# Patient Record
Sex: Female | Born: 1940 | Race: White | Hispanic: No
Health system: Southern US, Academic
[De-identification: ages and names within clinical notes are randomized; demographics above are authoritative.]

## PROBLEM LIST (undated history)

## (undated) ENCOUNTER — Encounter: Attending: Geriatric Medicine | Primary: Geriatric Medicine

## (undated) ENCOUNTER — Ambulatory Visit

## (undated) ENCOUNTER — Encounter: Attending: Pharmacist | Primary: Pharmacist

## (undated) ENCOUNTER — Ambulatory Visit: Payer: MEDICARE

## (undated) ENCOUNTER — Ambulatory Visit: Payer: MEDICARE | Attending: Adult Health | Primary: Adult Health

## (undated) ENCOUNTER — Telehealth: Attending: Radiation Oncology | Primary: Radiation Oncology

## (undated) ENCOUNTER — Ambulatory Visit: Payer: MEDICARE | Attending: Geriatric Medicine | Primary: Geriatric Medicine

## (undated) ENCOUNTER — Telehealth

## (undated) ENCOUNTER — Telehealth: Attending: Pharmacist | Primary: Pharmacist

## (undated) ENCOUNTER — Encounter

## (undated) ENCOUNTER — Encounter: Attending: Nurse Practitioner | Primary: Nurse Practitioner

## (undated) ENCOUNTER — Telehealth: Attending: Geriatric Medicine | Primary: Geriatric Medicine

## (undated) ENCOUNTER — Encounter
Attending: Student in an Organized Health Care Education/Training Program | Primary: Student in an Organized Health Care Education/Training Program

## (undated) ENCOUNTER — Encounter: Attending: Adult Health | Primary: Adult Health

## (undated) ENCOUNTER — Ambulatory Visit: Attending: Radiation Oncology | Primary: Radiation Oncology

## (undated) ENCOUNTER — Telehealth: Attending: Critical Care Medicine | Primary: Critical Care Medicine

## (undated) ENCOUNTER — Ambulatory Visit: Payer: MEDICARE | Attending: Nurse Practitioner | Primary: Nurse Practitioner

## (undated) ENCOUNTER — Encounter: Attending: Surgery | Primary: Surgery

## (undated) ENCOUNTER — Encounter: Attending: Clinical | Primary: Clinical

## (undated) ENCOUNTER — Encounter: Attending: Radiation Oncology | Primary: Radiation Oncology

## (undated) DIAGNOSIS — J309 Allergic rhinitis, unspecified: Secondary | ICD-10-CM

## (undated) DIAGNOSIS — L03211 Cellulitis of face: Secondary | ICD-10-CM

## (undated) DIAGNOSIS — E119 Type 2 diabetes mellitus without complications: Secondary | ICD-10-CM

## (undated) DIAGNOSIS — K279 Peptic ulcer, site unspecified, unspecified as acute or chronic, without hemorrhage or perforation: Secondary | ICD-10-CM

## (undated) DIAGNOSIS — R5381 Other malaise: Secondary | ICD-10-CM

## (undated) DIAGNOSIS — C50312 Malignant neoplasm of lower-inner quadrant of left female breast: Secondary | ICD-10-CM

## (undated) DIAGNOSIS — B029 Zoster without complications: Secondary | ICD-10-CM

## (undated) DIAGNOSIS — M949 Disorder of cartilage, unspecified: Secondary | ICD-10-CM

## (undated) DIAGNOSIS — M25569 Pain in unspecified knee: Secondary | ICD-10-CM

## (undated) DIAGNOSIS — D509 Iron deficiency anemia, unspecified: Secondary | ICD-10-CM

## (undated) DIAGNOSIS — M899 Disorder of bone, unspecified: Secondary | ICD-10-CM

## (undated) DIAGNOSIS — J019 Acute sinusitis, unspecified: Secondary | ICD-10-CM

## (undated) DIAGNOSIS — E785 Hyperlipidemia, unspecified: Secondary | ICD-10-CM

## (undated) DIAGNOSIS — R5383 Other fatigue: Secondary | ICD-10-CM

## (undated) DIAGNOSIS — I10 Essential (primary) hypertension: Secondary | ICD-10-CM

## (undated) DIAGNOSIS — J209 Acute bronchitis, unspecified: Secondary | ICD-10-CM

## (undated) DIAGNOSIS — M545 Low back pain: Secondary | ICD-10-CM

## (undated) HISTORY — DX: Peptic ulcer, site unspecified, unspecified as acute or chronic, without hemorrhage or perforation: K27.9

## (undated) HISTORY — PX: APPENDECTOMY: SHX54

## (undated) HISTORY — DX: Iron deficiency anemia, unspecified: D50.9

## (undated) HISTORY — DX: Disorder of bone, unspecified: M89.9

## (undated) HISTORY — DX: Malignant neoplasm of lower-inner quadrant of left female breast: C50.312

## (undated) HISTORY — DX: Disorder of cartilage, unspecified: M94.9

## (undated) HISTORY — DX: Type 2 diabetes mellitus without complications: E11.9

## (undated) HISTORY — DX: Low back pain: M54.5

## (undated) HISTORY — DX: Other malaise: R53.81

## (undated) HISTORY — DX: Pain in unspecified knee: M25.569

## (undated) HISTORY — DX: Acute bronchitis, unspecified: J20.9

## (undated) HISTORY — PX: ABDOMINAL HYSTERECTOMY: SHX81

## (undated) HISTORY — DX: Allergic rhinitis, unspecified: J30.9

## (undated) HISTORY — DX: Other fatigue: R53.83

## (undated) HISTORY — DX: Essential (primary) hypertension: I10

## (undated) HISTORY — DX: Hyperlipidemia, unspecified: E78.5

## (undated) HISTORY — DX: Acute sinusitis, unspecified: J01.90

---
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
## 1968-03-29 HISTORY — PX: ESOPHAGOGASTRODUODENOSCOPY: SHX1529

## 2005-03-23 ENCOUNTER — Emergency Department (HOSPITAL_COMMUNITY): Admission: EM | Admit: 2005-03-23 | Discharge: 2005-03-23 | Payer: Self-pay | Admitting: Family Medicine

## 2005-03-29 LAB — CONVERTED CEMR LAB: Pap Smear: NORMAL

## 2005-05-06 ENCOUNTER — Ambulatory Visit: Payer: Self-pay | Admitting: Internal Medicine

## 2005-05-10 ENCOUNTER — Ambulatory Visit: Payer: Self-pay | Admitting: Internal Medicine

## 2005-05-11 ENCOUNTER — Ambulatory Visit: Payer: Self-pay | Admitting: Internal Medicine

## 2005-06-07 ENCOUNTER — Ambulatory Visit: Payer: Self-pay | Admitting: Internal Medicine

## 2006-01-25 ENCOUNTER — Ambulatory Visit: Payer: Self-pay | Admitting: Internal Medicine

## 2006-02-28 ENCOUNTER — Ambulatory Visit: Payer: Self-pay | Admitting: Internal Medicine

## 2006-04-27 ENCOUNTER — Ambulatory Visit: Payer: Self-pay | Admitting: Internal Medicine

## 2006-05-27 ENCOUNTER — Ambulatory Visit: Payer: Self-pay | Admitting: Internal Medicine

## 2006-11-05 ENCOUNTER — Encounter: Payer: Self-pay | Admitting: Internal Medicine

## 2006-11-05 DIAGNOSIS — I10 Essential (primary) hypertension: Secondary | ICD-10-CM

## 2006-11-05 HISTORY — DX: Essential (primary) hypertension: I10

## 2006-11-30 ENCOUNTER — Ambulatory Visit: Payer: Self-pay | Admitting: Internal Medicine

## 2006-11-30 LAB — CONVERTED CEMR LAB
ALT: 17 units/L (ref 0–35)
BUN: 21 mg/dL (ref 6–23)
Basophils Absolute: 0 10*3/uL (ref 0.0–0.1)
Bilirubin, Direct: 0.1 mg/dL (ref 0.0–0.3)
Chloride: 105 meq/L (ref 96–112)
Cholesterol: 209 mg/dL (ref 0–200)
Creatinine, Ser: 0.9 mg/dL (ref 0.4–1.2)
GFR calc Af Amer: 81 mL/min
GFR calc non Af Amer: 67 mL/min
Glucose, Bld: 117 mg/dL — ABNORMAL HIGH (ref 70–99)
Hgb A1c MFr Bld: 6.6 % — ABNORMAL HIGH (ref 4.6–6.0)
MCHC: 34.1 g/dL (ref 30.0–36.0)
Monocytes Relative: 10.5 % (ref 3.0–11.0)
RBC: 4.07 M/uL (ref 3.87–5.11)
RDW: 15.8 % — ABNORMAL HIGH (ref 11.5–14.6)
Sodium: 141 meq/L (ref 135–145)
TSH: 1.13 microintl units/mL (ref 0.35–5.50)
Total CHOL/HDL Ratio: 6.9
Total Protein: 7.8 g/dL (ref 6.0–8.3)
Triglycerides: 117 mg/dL (ref 0–149)

## 2006-12-08 ENCOUNTER — Ambulatory Visit (HOSPITAL_COMMUNITY): Admission: RE | Admit: 2006-12-08 | Discharge: 2006-12-08 | Payer: Self-pay | Admitting: Internal Medicine

## 2007-08-16 ENCOUNTER — Telehealth: Payer: Self-pay | Admitting: Internal Medicine

## 2008-02-12 ENCOUNTER — Ambulatory Visit: Payer: Self-pay | Admitting: Internal Medicine

## 2008-02-12 DIAGNOSIS — E785 Hyperlipidemia, unspecified: Secondary | ICD-10-CM | POA: Insufficient documentation

## 2008-02-12 DIAGNOSIS — M545 Low back pain, unspecified: Secondary | ICD-10-CM

## 2008-02-12 DIAGNOSIS — K279 Peptic ulcer, site unspecified, unspecified as acute or chronic, without hemorrhage or perforation: Secondary | ICD-10-CM | POA: Insufficient documentation

## 2008-02-12 DIAGNOSIS — M949 Disorder of cartilage, unspecified: Secondary | ICD-10-CM

## 2008-02-12 DIAGNOSIS — M899 Disorder of bone, unspecified: Secondary | ICD-10-CM

## 2008-02-12 DIAGNOSIS — R5381 Other malaise: Secondary | ICD-10-CM

## 2008-02-12 DIAGNOSIS — R5383 Other fatigue: Secondary | ICD-10-CM

## 2008-02-12 DIAGNOSIS — D509 Iron deficiency anemia, unspecified: Secondary | ICD-10-CM

## 2008-02-12 DIAGNOSIS — J309 Allergic rhinitis, unspecified: Secondary | ICD-10-CM | POA: Insufficient documentation

## 2008-02-12 DIAGNOSIS — E119 Type 2 diabetes mellitus without complications: Secondary | ICD-10-CM

## 2008-02-12 HISTORY — DX: Allergic rhinitis, unspecified: J30.9

## 2008-02-12 HISTORY — DX: Iron deficiency anemia, unspecified: D50.9

## 2008-02-12 HISTORY — DX: Disorder of bone, unspecified: M89.9

## 2008-02-12 HISTORY — DX: Hyperlipidemia, unspecified: E78.5

## 2008-02-12 HISTORY — DX: Peptic ulcer, site unspecified, unspecified as acute or chronic, without hemorrhage or perforation: K27.9

## 2008-02-12 HISTORY — DX: Other malaise: R53.81

## 2008-02-12 HISTORY — DX: Type 2 diabetes mellitus without complications: E11.9

## 2008-02-12 HISTORY — DX: Low back pain, unspecified: M54.50

## 2008-02-13 LAB — CONVERTED CEMR LAB
ALT: 16 units/L (ref 0–35)
AST: 16 units/L (ref 0–37)
Alkaline Phosphatase: 86 units/L (ref 39–117)
Basophils Relative: 0.5 % (ref 0.0–3.0)
Bilirubin, Direct: 0.1 mg/dL (ref 0.0–0.3)
CO2: 29 meq/L (ref 19–32)
Calcium: 9.3 mg/dL (ref 8.4–10.5)
Chloride: 105 meq/L (ref 96–112)
Direct LDL: 145.9 mg/dL
Eosinophils Relative: 4.3 % (ref 0.0–5.0)
Folate: 18.7 ng/mL
HDL: 25.6 mg/dL — ABNORMAL LOW (ref >39.0)
Hemoglobin, Urine: NEGATIVE
Microalb, Ur: 0.7 mg/dL (ref 0.0–1.9)
Monocytes Relative: 10.5 % (ref 3.0–12.0)
Neutrophils Relative %: 60.1 % (ref 43.0–77.0)
Nitrite: NEGATIVE
Platelets: 343 10*3/uL (ref 150–400)
Potassium: 4.3 meq/L (ref 3.5–5.1)
RBC: 4.15 M/uL (ref 3.87–5.11)
Sed Rate: 53 mm/hr — ABNORMAL HIGH (ref 0–22)
Sodium: 141 meq/L (ref 135–145)
Specific Gravity, Urine: >=1.03 (ref 1.000–1.03)
TSH: 1.35 microintl units/mL (ref 0.35–5.50)
Total Bilirubin: 0.6 mg/dL (ref 0.3–1.2)
Total Protein, Urine: NEGATIVE mg/dL
Urobilinogen, UA: 0.2 (ref 0.0–1.0)
VLDL: 24 mg/dL (ref 0–40)
WBC: 9.8 10*3/uL (ref 4.5–10.5)

## 2008-02-21 ENCOUNTER — Telehealth: Payer: Self-pay | Admitting: Internal Medicine

## 2008-03-12 ENCOUNTER — Ambulatory Visit: Payer: Self-pay | Admitting: Internal Medicine

## 2008-03-12 LAB — CONVERTED CEMR LAB
Alkaline Phosphatase: 91 units/L (ref 39–117)
Bilirubin, Direct: 0.1 mg/dL (ref 0.0–0.3)
Cholesterol: 148 mg/dL (ref 0–200)
LDL Cholesterol: 95 mg/dL (ref 0–99)
Total Protein: 7.3 g/dL (ref 6.0–8.3)

## 2008-04-02 ENCOUNTER — Ambulatory Visit: Payer: Self-pay | Admitting: Internal Medicine

## 2008-04-16 ENCOUNTER — Ambulatory Visit: Payer: Self-pay | Admitting: Internal Medicine

## 2008-04-16 LAB — HM COLONOSCOPY

## 2008-07-06 ENCOUNTER — Ambulatory Visit: Payer: Self-pay | Admitting: Family Medicine

## 2008-07-06 DIAGNOSIS — J209 Acute bronchitis, unspecified: Secondary | ICD-10-CM

## 2008-07-06 HISTORY — DX: Acute bronchitis, unspecified: J20.9

## 2008-10-08 ENCOUNTER — Ambulatory Visit: Payer: Self-pay | Admitting: Internal Medicine

## 2008-10-08 DIAGNOSIS — J019 Acute sinusitis, unspecified: Secondary | ICD-10-CM

## 2008-10-08 HISTORY — DX: Acute sinusitis, unspecified: J01.90

## 2008-11-14 ENCOUNTER — Ambulatory Visit: Payer: Self-pay | Admitting: Internal Medicine

## 2009-04-30 ENCOUNTER — Telehealth: Payer: Self-pay | Admitting: Internal Medicine

## 2009-05-14 ENCOUNTER — Ambulatory Visit: Payer: Self-pay | Admitting: Internal Medicine

## 2009-05-14 ENCOUNTER — Telehealth (INDEPENDENT_AMBULATORY_CARE_PROVIDER_SITE_OTHER): Payer: Self-pay | Admitting: *Deleted

## 2009-05-14 LAB — CONVERTED CEMR LAB
BUN: 15 mg/dL (ref 6–23)
Basophils Absolute: 0 10*3/uL (ref 0.0–0.1)
Bilirubin, Direct: 0.1 mg/dL (ref 0.0–0.3)
CO2: 30 meq/L (ref 19–32)
Chloride: 100 meq/L (ref 96–112)
Creatinine, Ser: 0.8 mg/dL (ref 0.4–1.2)
Eosinophils Absolute: 0.2 10*3/uL (ref 0.0–0.7)
Hemoglobin, Urine: NEGATIVE
Hgb A1c MFr Bld: 6.5 % (ref 4.6–6.5)
Iron: 24 ug/dL — ABNORMAL LOW (ref 42–145)
Ketones, ur: NEGATIVE mg/dL
MCHC: 32.8 g/dL (ref 30.0–36.0)
MCV: 82.6 fL (ref 78.0–100.0)
Microalb, Ur: 1.1 mg/dL (ref 0.0–1.9)
Monocytes Absolute: 0.8 10*3/uL (ref 0.1–1.0)
Neutrophils Relative %: 63.2 % (ref 43.0–77.0)
Platelets: 349 10*3/uL (ref 150.0–400.0)
Saturation Ratios: 7.3 % — ABNORMAL LOW (ref 20.0–50.0)
Sed Rate: 40 mm/hr — ABNORMAL HIGH (ref 0–22)
Total Bilirubin: 0.5 mg/dL (ref 0.3–1.2)
Total CHOL/HDL Ratio: 6
Triglycerides: 138 mg/dL (ref 0.0–149.0)
Urine Glucose: NEGATIVE mg/dL
Urobilinogen, UA: 0.2 (ref 0.0–1.0)
VLDL: 27.6 mg/dL (ref 0.0–40.0)
Vitamin B-12: 773 pg/mL (ref 211–911)
WBC: 8.3 10*3/uL (ref 4.5–10.5)

## 2009-05-15 LAB — CONVERTED CEMR LAB: Vit D, 25-Hydroxy: 28 ng/mL — ABNORMAL LOW (ref 30–89)
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
## 2010-04-30 NOTE — Progress Notes (Signed)
Summary: Rx change  Phone Note Call from Patient Call back at Home Phone [phone]   Caller: Patient Call For: Corwin Levins MD Summary of Call: Patient came into the office with a letter from her insurance company. Her coverage on Benicar has changed and her copay has went up dramatically. She was wondering if she could get put on a more affordable medication.  Initial call taken by: Irma Newness,  April 30, 2009 3:16 PM  Follow-up for Phone Call        ok to try change to diovan hct  - done escript Follow-up by: Corwin Levins MD,  April 30, 2009 4:11 PM  Additional Follow-up for Phone Call Additional follow up Details #1::        pt informed via VM Additional Follow-up by: Margaret Pyle, CMA,  April 30, 2009 4:15 PM    New/Updated Medications: DIOVAN HCT 320-25 MG TABS (VALSARTAN-HYDROCHLOROTHIAZIDE) 1 by mouth once daily Prescriptions: DIOVAN HCT 320-25 MG TABS (VALSARTAN-HYDROCHLOROTHIAZIDE) 1 by mouth once daily  #90 x 3   Entered and Authorized by:   Corwin Levins MD   Signed by:   Corwin Levins MD on 04/30/2009   Method used:   Electronically to        [facility] [phone]* (retail)       [address]       [place], [place]  [id number]       Ph: [phone] or [phone]       Fax: [phone]   RxID:   [id number]

## 2010-04-30 NOTE — Progress Notes (Signed)
----   Converted from flag ---- ---- 05/14/2009 4:19 PM, Corwin Levins MD wrote: to send addon slip to lab for this pt -   hgba1c - 790.2 ------------------------------  sent addon to lab, hgba1c 790.2

## 2010-05-11 ENCOUNTER — Ambulatory Visit (INDEPENDENT_AMBULATORY_CARE_PROVIDER_SITE_OTHER)
Admission: RE | Admit: 2010-05-11 | Discharge: 2010-05-11 | Disposition: A | Discharge status: Home / Self Care | Payer: Medicare Other | Source: Ambulatory Visit | Attending: Internal Medicine | Admitting: Internal Medicine

## 2010-05-11 ENCOUNTER — Ambulatory Visit (INDEPENDENT_AMBULATORY_CARE_PROVIDER_SITE_OTHER): Payer: Medicare Other | Admitting: Internal Medicine

## 2010-05-11 ENCOUNTER — Other Ambulatory Visit: Payer: Self-pay | Admitting: Internal Medicine

## 2010-05-11 ENCOUNTER — Other Ambulatory Visit: Payer: Medicare Other

## 2010-05-11 ENCOUNTER — Encounter: Payer: Self-pay | Admitting: Internal Medicine

## 2010-05-11 ENCOUNTER — Encounter (INDEPENDENT_AMBULATORY_CARE_PROVIDER_SITE_OTHER): Payer: Self-pay | Admitting: *Deleted

## 2010-05-11 DIAGNOSIS — E785 Hyperlipidemia, unspecified: Secondary | ICD-10-CM

## 2010-05-11 DIAGNOSIS — M25569 Pain in unspecified knee: Secondary | ICD-10-CM

## 2010-05-11 DIAGNOSIS — R5383 Other fatigue: Secondary | ICD-10-CM

## 2010-05-11 DIAGNOSIS — Z23 Encounter for immunization: Secondary | ICD-10-CM

## 2010-05-11 DIAGNOSIS — E119 Type 2 diabetes mellitus without complications: Secondary | ICD-10-CM

## 2010-05-11 DIAGNOSIS — R5381 Other malaise: Secondary | ICD-10-CM

## 2010-05-11 DIAGNOSIS — M949 Disorder of cartilage, unspecified: Secondary | ICD-10-CM

## 2010-05-11 HISTORY — DX: Pain in unspecified knee: M25.569

## 2010-05-11 LAB — CBC WITH DIFFERENTIAL/PLATELET
Basophils Relative: 0.3 % (ref 0.0–3.0)
Eosinophils Absolute: 0.3 10*3/uL (ref 0.0–0.7)
Eosinophils Relative: 3.6 % (ref 0.0–5.0)
HCT: 35.8 % — ABNORMAL LOW (ref 36.0–46.0)
Hemoglobin: 11.9 g/dL — ABNORMAL LOW (ref 12.0–15.0)
Lymphs Abs: 2.3 10*3/uL (ref 0.7–4.0)
MCHC: 33.3 g/dL (ref 30.0–36.0)
MCV: 80.4 fl (ref 78.0–100.0)
Monocytes Absolute: 0.8 10*3/uL (ref 0.1–1.0)
Neutro Abs: 6 10*3/uL (ref 1.4–7.7)
Neutrophils Relative %: 63.5 % (ref 43.0–77.0)
RBC: 4.45 Mil/uL (ref 3.87–5.11)

## 2010-05-11 LAB — HEPATIC FUNCTION PANEL
ALT: 16 U/L (ref 0–35)
Albumin: 3.6 g/dL (ref 3.5–5.2)
Total Protein: 7.6 g/dL (ref 6.0–8.3)

## 2010-05-11 LAB — BASIC METABOLIC PANEL
CO2: 27 mEq/L (ref 19–32)
Chloride: 96 mEq/L (ref 96–112)
Creatinine, Ser: 1 mg/dL (ref 0.4–1.2)
Potassium: 4.2 mEq/L (ref 3.5–5.1)

## 2010-05-11 LAB — HEMOGLOBIN A1C: Hgb A1c MFr Bld: 6.6 % — ABNORMAL HIGH (ref 4.6–6.5)

## 2010-05-11 LAB — LIPID PANEL
Cholesterol: 207 mg/dL — ABNORMAL HIGH (ref 0–200)
Triglycerides: 114 mg/dL (ref 0.0–149.0)

## 2010-05-11 LAB — LDL CHOLESTEROL, DIRECT: Direct LDL: 159.8 mg/dL

## 2010-05-11 LAB — TSH: TSH: 0.98 u[IU]/mL (ref 0.35–5.50)

## 2010-05-11 LAB — MICROALBUMIN / CREATININE URINE RATIO: Microalb Creat Ratio: 2.1 mg/g (ref 0.0–30.0)

## 2010-05-14 ENCOUNTER — Ambulatory Visit (INDEPENDENT_AMBULATORY_CARE_PROVIDER_SITE_OTHER)
Admission: RE | Admit: 2010-05-14 | Discharge: 2010-05-14 | Disposition: A | Discharge status: Home / Self Care | Payer: Medicare Other | Source: Ambulatory Visit | Attending: Internal Medicine | Admitting: Internal Medicine

## 2010-05-14 ENCOUNTER — Other Ambulatory Visit: Payer: Self-pay | Admitting: Internal Medicine

## 2010-05-14 ENCOUNTER — Encounter: Payer: Self-pay | Admitting: Internal Medicine

## 2010-05-14 ENCOUNTER — Other Ambulatory Visit: Payer: Medicare Other

## 2010-05-14 DIAGNOSIS — M899 Disorder of bone, unspecified: Secondary | ICD-10-CM
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
## 2010-06-08 ENCOUNTER — Telehealth: Payer: Self-pay | Admitting: Internal Medicine

## 2010-06-11 ENCOUNTER — Other Ambulatory Visit: Payer: Self-pay | Admitting: Internal Medicine

## 2010-06-11 DIAGNOSIS — M25562 Pain in left knee: Secondary | ICD-10-CM

## 2010-06-16 NOTE — Progress Notes (Signed)
Summary: MRI req  Phone Note Call from Patient Call back at Home Phone [phone]   Caller: Patient Summary of Call: Pt came into clinic and filled out a walk in form stating she is having persistant knee pain. Pt is requesting MRI to help eval knee pain. Initial call taken by: Margaret Pyle, CMA,  June 08, 2010 3:01 PM  Follow-up for Phone Call        ok for MRI  - will order Follow-up by: Corwin Levins MD,  June 08, 2010 4:59 PM  Additional Follow-up for Phone Call Additional follow up Details #1::        Pt informed, will expect a call from [facility] with appt info Additional Follow-up by: Margaret Pyle, CMA,  June 09, 2010 8:41 AM

## 2010-06-17 ENCOUNTER — Ambulatory Visit (HOSPITAL_COMMUNITY)
Admission: RE | Admit: 2010-06-17 | Discharge: 2010-06-17 | Disposition: A | Discharge status: Home / Self Care | Payer: Medicare Other | Source: Ambulatory Visit | Attending: Internal Medicine | Admitting: Internal Medicine

## 2010-06-17 DIAGNOSIS — M25469 Effusion, unspecified knee: Secondary | ICD-10-CM | POA: Insufficient documentation

## 2010-06-17 DIAGNOSIS — M2240 Chondromalacia patellae, unspecified knee: Secondary | ICD-10-CM | POA: Insufficient documentation

## 2010-06-17 DIAGNOSIS — M25562 Pain in left knee: Secondary | ICD-10-CM

## 2010-09-11 ENCOUNTER — Emergency Department (HOSPITAL_COMMUNITY): Payer: Medicare Other

## 2010-09-11 ENCOUNTER — Emergency Department (HOSPITAL_COMMUNITY)
Admission: EM | Admit: 2010-09-11 | Discharge: 2010-09-12 | Disposition: A | Discharge status: Home / Self Care | Payer: Medicare Other | Attending: Emergency Medicine | Admitting: Emergency Medicine

## 2010-09-11 DIAGNOSIS — W108XXA Fall (on) (from) other stairs and steps, initial encounter: Secondary | ICD-10-CM | POA: Insufficient documentation

## 2010-09-11 DIAGNOSIS — I10 Essential (primary) hypertension: Secondary | ICD-10-CM | POA: Insufficient documentation

## 2010-09-11 DIAGNOSIS — S52609A Unspecified fracture of lower end of unspecified ulna, initial encounter for closed fracture: Secondary | ICD-10-CM | POA: Insufficient documentation

## 2010-09-11 DIAGNOSIS — Y92009 Unspecified place in unspecified non-institutional (private) residence as the place of occurrence of the external cause: Secondary | ICD-10-CM | POA: Insufficient documentation

## 2010-09-11 DIAGNOSIS — S52509A Unspecified fracture of the lower end of unspecified radius, initial encounter for closed fracture: Secondary | ICD-10-CM | POA: Insufficient documentation

## 2010-09-11 DIAGNOSIS — M25539 Pain in unspecified wrist: Secondary | ICD-10-CM | POA: Insufficient documentation

## 2010-09-11 DIAGNOSIS — M25529 Pain in unspecified elbow: Secondary | ICD-10-CM | POA: Insufficient documentation

## 2010-09-12 ENCOUNTER — Emergency Department (HOSPITAL_COMMUNITY): Payer: Medicare Other

## 2010-09-14 NOTE — Op Note (Signed)
  Misty [name], Misty [name] NO.:  [id number]  MEDICAL RECORD NO.:  [id number]  LOCATION:  [facility]                         FACILITY:  [facility]  PHYSICIAN:  Johnette Abraham, MD    DATE OF BIRTH:  1940-07-26  DATE OF PROCEDURE:  09/12/2010 DATE OF DISCHARGE:  09/12/2010                              OPERATIVE REPORT   PREOPERATIVE DIAGNOSIS:  Closed fracture of the right distal radius and ulnar styloid.  POSTOPERATIVE DIAGNOSIS:  Closed fracture of the right distal radius and ulnar styloid.  PROCEDURES:  Closed reduction with IV sedation and manipulation of the right distal radius and ulnar styloid fracture.  INDICATIONS:  Misty [name] is a 70 year old white female who comes to the emergency department after a fall.  She has an obvious deformity to her wrist.  X-ray examination reveals comminuted fracture of distal radius and ulnar styloid.  Risks, benefits and alternatives of closed reduction and possible open reduction were discussed with her and her sister. They agreed to proceed with sedation and closed reduction.  Consent was obtained.  PROCEDURE:  A Time-out was performed, IV sedation with Versed and Dilaudid were given.  Furthermore, a hematoma block was performed with instilling approximately 8 cc of 1% plain lidocaine into the fracture site.  In- line traction and dorsal pressure were placed on the wrist and a closed reduction was performed.  All fingers remained pink at the conclusion of the reduction.  The patient tolerated the procedure well.  A long-arm sugar-tong splint was placed and postoperative x-rays are pending.  The patient tolerated the procedure well.     Johnette Abraham, MD     HCC/MEDQ  D:  09/12/2010  T:  09/12/2010  Job:  [id number]  Electronically Signed by Knute Neu MD on 09/14/2010 09:38:55 AM

## 2010-11-03 LAB — HM MAMMOGRAPHY

## 2010-11-07 ENCOUNTER — Encounter: Payer: Self-pay | Admitting: Internal Medicine

## 2010-11-07 DIAGNOSIS — Z0000 Encounter for general adult medical examination without abnormal findings: Secondary | ICD-10-CM | POA: Insufficient documentation

## 2010-11-09 ENCOUNTER — Encounter: Payer: Self-pay | Admitting: Internal Medicine

## 2010-11-09 ENCOUNTER — Other Ambulatory Visit: Payer: Self-pay | Admitting: Internal Medicine

## 2010-11-09 ENCOUNTER — Ambulatory Visit (INDEPENDENT_AMBULATORY_CARE_PROVIDER_SITE_OTHER): Payer: Medicare Other | Admitting: Internal Medicine

## 2010-11-09 ENCOUNTER — Other Ambulatory Visit (INDEPENDENT_AMBULATORY_CARE_PROVIDER_SITE_OTHER): Payer: Medicare Other

## 2010-11-09 VITALS — BP 110/68 | HR 80 | Temp 98.2°F | Ht 63.0 in | Wt 163.4 lb

## 2010-11-09 DIAGNOSIS — E119 Type 2 diabetes mellitus without complications: Secondary | ICD-10-CM

## 2010-11-09 DIAGNOSIS — M949 Disorder of cartilage, unspecified: Secondary | ICD-10-CM

## 2010-11-09 DIAGNOSIS — I10 Essential (primary) hypertension: Secondary | ICD-10-CM

## 2010-11-09 DIAGNOSIS — E785 Hyperlipidemia, unspecified: Secondary | ICD-10-CM

## 2010-11-09 LAB — BASIC METABOLIC PANEL
BUN: 23 mg/dL (ref 6–23)
Chloride: 100 mEq/L (ref 96–112)
GFR: 56.86 mL/min — ABNORMAL LOW (ref >60.00)
Glucose, Bld: 129 mg/dL — ABNORMAL HIGH (ref 70–99)
Potassium: 3.8 mEq/L (ref 3.5–5.1)
Sodium: 136 mEq/L (ref 135–145)

## 2010-11-09 LAB — LDL CHOLESTEROL, DIRECT: Direct LDL: 154.4 mg/dL

## 2010-11-09 LAB — HEMOGLOBIN A1C: Hgb A1c MFr Bld: 6.3 % (ref 4.6–6.5)

## 2010-11-09 NOTE — Assessment & Plan Note (Signed)
Has been unable to tolerate the zocor and lipitor;  To cont diet, check lipids, goal ldl < 70  Lab Results  Component Value Date   LDLCALC 95 03/12/2008

## 2010-11-09 NOTE — Patient Instructions (Signed)
Continue all other medications as before Please go to LAB in the Basement for the blood and/or urine tests to be done today Please call the phone number [phone] (the [employer]Tree System) for results of testing in 2-3 days;  When calling, simply dial the number, and when prompted enter the MRN number above (the Medical Record Number) and the # key, then the message should start. Please also consider taking the [place] plus D twice per day (for calcium and Vit D) Please return in 6 months, or sooner if needed

## 2010-11-09 NOTE — Progress Notes (Signed)
  Subjective:    Patient ID: Misty [name], female    DOB: 08/09/1940, 70 y.o.   MRN: [id number]  HPI  Here to f/u; overall doing ok,  Pt denies chest pain, increased sob or doe, wheezing, orthopnea, PND, increased LE swelling, palpitations, dizziness or syncope.  Pt denies new neurological symptoms such as new headache, or facial or extremity weakness or numbness   Pt denies polydipsia, polyuria, or low sugar symptoms such as weakness or confusion improved with po intake.  Pt states overall good compliance with meds, trying to follow lower cholesterol, diabetic diet, wt overall stable but little exercise however. Not taking the lipitor due to nausea and feeling sick.  Daughter currently with malignant cancer and recent sepsis now improved, so much stress lately.  Also with a right wrist fx from a fall down steps at home, Dr Louanna Raw surgury.  Denies worsening depressive symptoms, suicidal ideation, or panic.  Past Medical History  Diagnosis Date  . Acute bronchitis 07/06/2008  . ALLERGIC RHINITIS 02/12/2008  . ANEMIA-IRON DEFICIENCY 02/12/2008  . DIABETES MELLITUS, TYPE II 02/12/2008  . FATIGUE 02/12/2008  . HYPERLIPIDEMIA 02/12/2008  . HYPERTENSION 11/05/2006  . KNEE PAIN, LEFT, ACUTE 05/11/2010  . LOW BACK PAIN 02/12/2008  . OSTEOPENIA 02/12/2008  . PEPTIC ULCER DISEASE 02/12/2008  . SINUSITIS- ACUTE-NOS 10/08/2008   Past Surgical History  Procedure Date  . Esophagogastroduodenoscopy 1970  . Appendectomy   . Abdominal hysterectomy     reports that she has quit smoking. She does not have any smokeless tobacco history on file. She reports that she drinks alcohol. She reports that she does not use illicit drugs. family history includes Cancer in her brother and other; Colon polyps (age of onset:62) in her brother; Coronary artery disease in her brother; Depression in her child; Hypertension in her brother; and Mental illness in her other. Allergies  Allergen Reactions  . Lipitor  (Atorvastatin Calcium) Nausea Only  . Simvastatin     REACTION: gi upset   Current Outpatient Prescriptions on File Prior to Visit  Medication Sig Dispense Refill  . aspirin 81 MG tablet Take 81 mg by mouth daily.        . valsartan-hydrochlorothiazide (DIOVAN-HCT) 320-25 MG per tablet Take 1 tablet by mouth daily.         Review of Systems Review of Systems  Constitutional: Negative for diaphoresis and unexpected weight change.  HENT: Negative for drooling and tinnitus.   Eyes: Negative for photophobia and visual disturbance.  Respiratory: Negative for choking and stridor.   Gastrointestinal: Negative for vomiting and blood in stool.     Objective:   Physical Exam BP 110/68  Pulse 80  Temp(Src) 98.2 F (36.8 C) (Oral)  Ht 5\' 3"  (1.6 m)  Wt 163 lb 6 oz (74.106 kg)  BMI 28.94 kg/m2  SpO2 97% Physical Exam  VS noted Constitutional: Pt appears well-developed and well-nourished.  HENT: Head: Normocephalic.  Right Ear: External ear normal.  Left Ear: External ear normal.  Eyes: Conjunctivae and EOM are normal. Pupils are equal, round, and reactive to light.  Neck: Normal range of motion. Neck supple.  Cardiovascular: Normal rate and regular rhythm.   Pulmonary/Chest: Effort normal and breath sounds normal.  Neurological: Pt is alert. No cranial nerve deficit.  Skin: Skin is warm. No erythema.  Psychiatric: Pt behavior is normal. Thought content normal. 1+ nervous       Assessment & Plan:

## 2010-11-09 NOTE — Assessment & Plan Note (Signed)
stable overall by hx and exam, most recent data reviewed with pt, and pt to continue medical treatment as before  BP Readings from Last 3 Encounters:  11/09/10 110/68  05/11/10 126/78  05/14/09 122/72

## 2010-11-09 NOTE — Assessment & Plan Note (Signed)
D/w pt last dxa from feb 2012 - mild osteopenia only, for oscal plus D bid, exercise, f/u dxa at 2 yrs

## 2010-11-09 NOTE — Assessment & Plan Note (Signed)
stable overall by hx and exam, most recent data reviewed with pt, and pt to continue medical treatment as before ble  Lab Results  Component Value Date   HGBA1C 6.6* 05/11/2010

## 2010-11-10 ENCOUNTER — Encounter: Payer: Self-pay | Admitting: Internal Medicine

## 2010-11-10 ENCOUNTER — Other Ambulatory Visit: Payer: Self-pay | Admitting: Internal Medicine

## 2010-11-10 MED ORDER — SIMVASTATIN 40 MG PO TABS: 40.0000 mg | ORAL_TABLET | Freq: Every evening | ORAL | Status: DC

## 2011-05-10 ENCOUNTER — Encounter: Payer: Self-pay | Admitting: Internal Medicine

## 2011-05-10 ENCOUNTER — Ambulatory Visit (INDEPENDENT_AMBULATORY_CARE_PROVIDER_SITE_OTHER): Payer: Medicare Other | Admitting: Internal Medicine

## 2011-05-10 ENCOUNTER — Other Ambulatory Visit: Payer: Self-pay | Admitting: Internal Medicine

## 2011-05-10 ENCOUNTER — Other Ambulatory Visit (INDEPENDENT_AMBULATORY_CARE_PROVIDER_SITE_OTHER): Payer: Medicare Other

## 2011-05-10 VITALS — BP 102/62 | HR 89 | Temp 97.3°F | Ht 64.0 in | Wt 166.2 lb

## 2011-05-10 DIAGNOSIS — R5381 Other malaise: Secondary | ICD-10-CM

## 2011-05-10 DIAGNOSIS — R5383 Other fatigue: Secondary | ICD-10-CM

## 2011-05-10 DIAGNOSIS — I10 Essential (primary) hypertension: Secondary | ICD-10-CM

## 2011-05-10 DIAGNOSIS — E785 Hyperlipidemia, unspecified: Secondary | ICD-10-CM | POA: Diagnosis not present

## 2011-05-10 DIAGNOSIS — E119 Type 2 diabetes mellitus without complications: Secondary | ICD-10-CM

## 2011-05-10 LAB — HEPATIC FUNCTION PANEL
AST: 15 U/L (ref 0–37)
Total Bilirubin: 0.4 mg/dL (ref 0.3–1.2)

## 2011-05-10 LAB — MICROALBUMIN / CREATININE URINE RATIO
Creatinine,U: 151.7 mg/dL
Microalb Creat Ratio: 0.9 mg/g (ref 0.0–30.0)

## 2011-05-10 LAB — CBC WITH DIFFERENTIAL/PLATELET
Basophils Relative: 0.2 % (ref 0.0–3.0)
Eosinophils Relative: 2.8 % (ref 0.0–5.0)
HCT: 35 % — ABNORMAL LOW (ref 36.0–46.0)
Hemoglobin: 11.7 g/dL — ABNORMAL LOW (ref 12.0–15.0)
Lymphs Abs: 2.5 10*3/uL (ref 0.7–4.0)
MCV: 81.7 fl (ref 78.0–100.0)
Monocytes Absolute: 1.3 10*3/uL — ABNORMAL HIGH (ref 0.1–1.0)
Monocytes Relative: 12.5 % — ABNORMAL HIGH (ref 3.0–12.0)
Neutro Abs: 6.1 10*3/uL (ref 1.4–7.7)
Platelets: 355 10*3/uL (ref 150.0–400.0)
WBC: 10.2 10*3/uL (ref 4.5–10.5)

## 2011-05-10 LAB — LIPID PANEL
Cholesterol: 203 mg/dL — ABNORMAL HIGH (ref 0–200)
Total CHOL/HDL Ratio: 6

## 2011-05-10 LAB — BASIC METABOLIC PANEL
BUN: 21 mg/dL (ref 6–23)
Chloride: 103 mEq/L (ref 96–112)
Creatinine, Ser: 0.9 mg/dL (ref 0.4–1.2)
GFR: 66.45 mL/min (ref >60.00)
Glucose, Bld: 102 mg/dL — ABNORMAL HIGH (ref 70–99)

## 2011-05-10 LAB — URINALYSIS, ROUTINE W REFLEX MICROSCOPIC
Bilirubin Urine: NEGATIVE
Ketones, ur: NEGATIVE
Specific Gravity, Urine: >=1.03 (ref 1.000–1.030)
Total Protein, Urine: NEGATIVE
Urine Glucose: NEGATIVE
pH: 5.5 (ref 5.0–8.0)

## 2011-05-10 LAB — HEMOGLOBIN A1C: Hgb A1c MFr Bld: 6.5 % (ref 4.6–6.5)

## 2011-05-10 MED ORDER — ROSUVASTATIN CALCIUM 20 MG PO TABS: 20.0000 mg | ORAL_TABLET | Freq: Every day | ORAL | Status: DC

## 2011-05-10 MED ORDER — VALSARTAN-HYDROCHLOROTHIAZIDE 320-25 MG PO TABS: 1.0000 | ORAL_TABLET | Freq: Every day | ORAL | Status: DC

## 2011-05-10 NOTE — Assessment & Plan Note (Signed)
stable overall by hx and exam, most recent data reviewed with pt, and pt to continue medical treatment as before  Lab Results  Component Value Date   HGBA1C 6.3 11/09/2010

## 2011-05-10 NOTE — Assessment & Plan Note (Signed)
Etiology unclear, Exam otherwise benign, to check labs as documented, follow with expectant management  

## 2011-05-10 NOTE — Patient Instructions (Signed)
Continue all other medications as before; your medication was sent to the pharmacy Please go to LAB in the Basement for the blood and/or urine tests to be done today Please call the phone number [phone] (the [employer] System) for results of testing in 2-3 days;  When calling, simply dial the number, and when prompted enter the MRN number above (the Medical Record Number) and the # key, then the message should start. You are otherwise up to date with prevention Please return in 6 months, or sooner if needed

## 2011-05-10 NOTE — Progress Notes (Signed)
Subjective:    Patient ID: Misty [name], female    DOB: June 16, 1940, 71 y.o.   MRN: [id number]  HPI  Here to f/u; overall doing ok,  Pt denies chest pain, increased sob or doe, wheezing, orthopnea, PND, increased LE swelling, palpitations, dizziness or syncope.  Pt denies new neurological symptoms such as new headache, or facial or extremity weakness or numbness   Pt denies polydipsia, polyuria, or low sugar symptoms such as weakness or confusion improved with po intake.  Pt states overall good compliance with meds, trying to follow lower cholesterol, diabetic diet, wt overall stable but little exercise however.  Has been able to lose several lbs intentionally with better diet, finances tight b/c she gives money to each of her 2 daughters who are underemployed.  Has been intol of lipitor and zocor in the past years due to Gi intolerance.   Pt denies fever, night sweats, loss of appetite, or other constitutional symptoms. Denies worsening depressive symptoms, suicidal ideation, or panic, though has ongoing anxiety, not increased recently, though has some "off and on" depressive low mood.  Pt continues to have recurring LBP without change in severity, bowel or bladder change, fever, wt loss,  worsening LE pain/numbness/weakness, gait change or falls.  Does have sense of ongoing fatigue, but denies signficant hypersomnolence.  Has 2 teeth that need to be worked on with dentist  Past Medical History  Diagnosis Date  . Acute bronchitis 07/06/2008  . ALLERGIC RHINITIS 02/12/2008  . ANEMIA-IRON DEFICIENCY 02/12/2008  . DIABETES MELLITUS, TYPE II 02/12/2008  . FATIGUE 02/12/2008  . HYPERLIPIDEMIA 02/12/2008  . HYPERTENSION 11/05/2006  . KNEE PAIN, LEFT, ACUTE 05/11/2010  . LOW BACK PAIN 02/12/2008  . OSTEOPENIA 02/12/2008  . PEPTIC ULCER DISEASE 02/12/2008  . SINUSITIS- ACUTE-NOS 10/08/2008   Past Surgical History  Procedure Date  . Esophagogastroduodenoscopy 1970  . Appendectomy   . Abdominal  hysterectomy     reports that she has quit smoking. She does not have any smokeless tobacco history on file. She reports that she drinks alcohol. She reports that she does not use illicit drugs. family history includes Cancer in her brother and other; Colon polyps (age of onset:62) in her brother; Coronary artery disease in her brother; Depression in her child; Hypertension in her brother; and Mental illness in her other. Allergies  Allergen Reactions  . Lipitor (Atorvastatin Calcium) Nausea Only  . Simvastatin     REACTION: gi upset   Current Outpatient Prescriptions on File Prior to Visit  Medication Sig Dispense Refill  . aspirin 81 MG tablet Take 81 mg by mouth daily.        . valsartan-hydrochlorothiazide (DIOVAN-HCT) 320-25 MG per tablet Take 1 tablet by mouth daily.        . simvastatin (ZOCOR) 40 MG tablet Take 1 tablet (40 mg total) by mouth every evening.  30 tablet  11   Review of Systems Review of Systems  Constitutional: Negative for diaphoresis and unexpected weight change.  HENT: Negative for drooling and tinnitus.   Eyes: Negative for photophobia and visual disturbance.  Respiratory: Negative for choking and stridor.   Gastrointestinal: Negative for vomiting and blood in stool.  Genitourinary: Negative for hematuria and decreased urine volume. .       Objective:   Physical Exam BP 102/62  Pulse 89  Temp(Src) 97.3 F (36.3 C) (Oral)  Ht 5\' 4"  (1.626 m)  Wt 166 lb 4 oz (75.411 kg)  BMI 28.54 kg/m2  SpO2 95% Physical  Exam  VS noted Constitutional: Pt appears well-developed and well-nourished.  HENT: Head: Normocephalic.  Right Ear: External ear normal.  Left Ear: External ear normal.  Eyes: Conjunctivae and EOM are normal. Pupils are equal, round, and reactive to light.  Neck: Normal range of motion. Neck supple.  Cardiovascular: Normal rate and regular rhythm.   Pulmonary/Chest: Effort normal and breath sounds normal.  Abd:  Soft, NT, non-distended, +  BS Neurological: Pt is alert. No cranial nerve deficit.  Skin: Skin is warm. No erythema.  Psychiatric: Pt behavior is normal. Thought content normal.     Assessment & Plan:

## 2011-05-10 NOTE — Assessment & Plan Note (Signed)
stable overall by hx and exam, most recent data reviewed with pt, and pt to continue medical treatment as before  BP Readings from Last 3 Encounters:  05/10/11 102/62  11/09/10 110/68  05/11/10 126/78

## 2011-05-10 NOTE — Assessment & Plan Note (Signed)
.  stable overall by hx and exam, most recent data reviewed with pt, and pt to continue medical treatment as before  Lab Results  Component Value Date   LDLCALC 95 03/12/2008   Goal < 70, declines furhter statin at this time

## 2011-05-18 ENCOUNTER — Other Ambulatory Visit: Payer: Self-pay | Admitting: *Deleted

## 2011-05-18 MED ORDER — LOVASTATIN 40 MG PO TABS: 40.0000 mg | ORAL_TABLET | Freq: Every day | ORAL | Status: DC

## 2011-05-18 NOTE — Telephone Encounter (Signed)
Ok to try change of crestor to lovastatin 40 - done per emr

## 2011-05-18 NOTE — Telephone Encounter (Signed)
R'cd fax from [facility] regarding Crestor rx. Pt is not eligible to use discount card for Crestor, pt wants alternative medication to be sent in due to cost.

## 2011-05-19 NOTE — Telephone Encounter (Signed)
Called the patient left message of medication change. 

## 2011-11-08 ENCOUNTER — Encounter: Payer: Self-pay | Admitting: Internal Medicine

## 2011-11-08 ENCOUNTER — Ambulatory Visit (INDEPENDENT_AMBULATORY_CARE_PROVIDER_SITE_OTHER): Payer: Medicare Other | Admitting: Internal Medicine

## 2011-11-08 ENCOUNTER — Other Ambulatory Visit (INDEPENDENT_AMBULATORY_CARE_PROVIDER_SITE_OTHER): Payer: Medicare Other

## 2011-11-08 VITALS — BP 120/70 | HR 93 | Temp 97.0°F | Ht 63.5 in | Wt 166.2 lb

## 2011-11-08 DIAGNOSIS — E119 Type 2 diabetes mellitus without complications: Secondary | ICD-10-CM

## 2011-11-08 DIAGNOSIS — I10 Essential (primary) hypertension: Secondary | ICD-10-CM

## 2011-11-08 DIAGNOSIS — E785 Hyperlipidemia, unspecified: Secondary | ICD-10-CM | POA: Diagnosis not present

## 2011-11-08 LAB — BASIC METABOLIC PANEL
BUN: 21 mg/dL (ref 6–23)
CO2: 29 mEq/L (ref 19–32)
Calcium: 9.2 mg/dL (ref 8.4–10.5)
Glucose, Bld: 109 mg/dL — ABNORMAL HIGH (ref 70–99)
Sodium: 138 mEq/L (ref 135–145)

## 2011-11-08 LAB — LIPID PANEL
Cholesterol: 211 mg/dL — ABNORMAL HIGH (ref 0–200)
HDL: 35.7 mg/dL — ABNORMAL LOW (ref >39.00)
Triglycerides: 143 mg/dL (ref 0.0–149.0)

## 2011-11-08 LAB — HEMOGLOBIN A1C: Hgb A1c MFr Bld: 6.4 % (ref 4.6–6.5)

## 2011-11-08 NOTE — Addendum Note (Signed)
Addended by: Corwin Levins on: 11/08/2011 10:24 AM   Modules accepted: Orders

## 2011-11-08 NOTE — Assessment & Plan Note (Signed)
stable overall by hx and exam, most recent data reviewed with pt, and pt to continue medical treatment as before Lab Results  Component Value Date   LDLCALC 95 03/12/2008

## 2011-11-08 NOTE — Assessment & Plan Note (Signed)
stable overall by hx and exam, most recent data reviewed with pt, and pt to continue medical treatment as before Lab Results  Component Value Date   HGBA1C 6.5 05/10/2011

## 2011-11-08 NOTE — Progress Notes (Signed)
Subjective:    Patient ID: Misty [name], female    DOB: 03/03/1941, 71 y.o.   MRN: [id number]  HPI  Here to f/u; overall doing ok,  Pt denies chest pain, increased sob or doe, wheezing, orthopnea, PND, increased LE swelling, palpitations, dizziness or syncope.  Pt denies new neurological symptoms such as new headache, or facial or extremity weakness or numbness   Pt denies polydipsia, polyuria, or low sugar symptoms such as weakness or confusion improved with po intake.  Pt states overall good compliance with meds, trying to follow lower cholesterol, diabetic diet, wt overall stable but little exercise however.  Had to stop the lovastatin due to GI upset. Has several teeth missing, needs an $11K bridge but getting by without it, has 4 children and their families she has been giving money to.  No acute complaints.  Denies worsening depressive symptoms, suicidal ideation, or panic, though has ongoing anxiety, not increased recently.    Pt denies fever, wt loss, night sweats, loss of appetite, or other constitutional symptoms Past Medical History  Diagnosis Date  . Acute bronchitis 07/06/2008  . ALLERGIC RHINITIS 02/12/2008  . ANEMIA-IRON DEFICIENCY 02/12/2008  . DIABETES MELLITUS, TYPE II 02/12/2008  . FATIGUE 02/12/2008  . HYPERLIPIDEMIA 02/12/2008  . HYPERTENSION 11/05/2006  . KNEE PAIN, LEFT, ACUTE 05/11/2010  . LOW BACK PAIN 02/12/2008  . OSTEOPENIA 02/12/2008  . PEPTIC ULCER DISEASE 02/12/2008  . SINUSITIS- ACUTE-NOS 10/08/2008   Past Surgical History  Procedure Date  . Esophagogastroduodenoscopy 1970  . Appendectomy   . Abdominal hysterectomy     reports that she has quit smoking. She does not have any smokeless tobacco history on file. She reports that she drinks alcohol. She reports that she does not use illicit drugs. family history includes Cancer in her brother and other; Colon polyps (age of onset:62) in her brother; Coronary artery disease in her brother; Depression in her child;  Hypertension in her brother; and Mental illness in her other. Allergies  Allergen Reactions  . Lipitor (Atorvastatin Calcium) Nausea Only  . Lovastatin Nausea Only  . Simvastatin     REACTION: gi upset   Current Outpatient Prescriptions on File Prior to Visit  Medication Sig Dispense Refill  . aspirin 81 MG tablet Take 81 mg by mouth daily.        . valsartan-hydrochlorothiazide (DIOVAN-HCT) 320-25 MG per tablet Take 1 tablet by mouth daily.  90 tablet  3   Review of Systems Review of Systems  Constitutional: Negative for diaphoresis and unexpected weight change.  HENT: Negative for drooling and tinnitus.   Eyes: Negative for photophobia and visual disturbance.  Respiratory: Negative for choking and stridor.   Gastrointestinal: Negative for vomiting and blood in stool.  Genitourinary: Negative for hematuria and decreased urine volume.  Musculoskeletal: Negative for gait problem.  Skin: Negative for color change and wound.  Neurological: Negative for tremors and numbness.  Psychiatric/Behavioral: Negative for decreased concentration. The patient is not hyperactive.      Objective:   Physical Exam BP 120/70  Pulse 93  Temp 97 F (36.1 C) (Oral)  Ht 5' 3.5" (1.613 m)  Wt 166 lb 4 oz (75.411 kg)  BMI 28.99 kg/m2  SpO2 98% Physical Exam  VS noted Constitutional: Pt appears well-developed and well-nourished.  HENT: Head: Normocephalic.  Right Ear: External ear normal.  Left Ear: External ear normal.  Eyes: Conjunctivae and EOM are normal. Pupils are equal, round, and reactive to light.  Neck: Normal range of motion.  Neck supple.  Cardiovascular: Normal rate and regular rhythm.   Pulmonary/Chest: Effort normal and breath sounds normal.  Abd:  Soft, NT, non-distended, + BS Neurological: Pt is alert. Not confused Skin: Skin is warm. No erythema.  Psychiatric: Pt behavior is normal. Thought content normal. 1+ nervous    Assessment & Plan:

## 2011-11-08 NOTE — Assessment & Plan Note (Signed)
stable overall by hx and exam, most recent data reviewed with pt, and pt to continue medical treatment as before BP Readings from Last 3 Encounters:  11/08/11 120/70  05/10/11 102/62  11/09/10 110/68

## 2011-11-08 NOTE — Patient Instructions (Addendum)
Continue all other medications as before Please have the pharmacy call with any refills you may need. Please continue your efforts at being more active, low cholesterol diet, and weight control. Please go to LAB in the Basement for the blood and/or urine tests to be done today You will be contacted by phone if any changes need to be made immediately.  Otherwise, you will receive a letter about your results with an explanation. Please return in 6 months, or sooner if needed

## 2012-05-10 ENCOUNTER — Other Ambulatory Visit: Payer: Self-pay | Admitting: Internal Medicine

## 2012-05-15 ENCOUNTER — Ambulatory Visit (INDEPENDENT_AMBULATORY_CARE_PROVIDER_SITE_OTHER): Payer: Medicare Other | Admitting: Internal Medicine

## 2012-05-15 ENCOUNTER — Encounter: Payer: Self-pay | Admitting: Internal Medicine

## 2012-05-15 ENCOUNTER — Other Ambulatory Visit (INDEPENDENT_AMBULATORY_CARE_PROVIDER_SITE_OTHER): Payer: Medicare Other

## 2012-05-15 VITALS — BP 122/70 | HR 88 | Temp 98.2°F | Ht 63.5 in | Wt 166.8 lb

## 2012-05-15 DIAGNOSIS — J309 Allergic rhinitis, unspecified: Secondary | ICD-10-CM | POA: Diagnosis not present

## 2012-05-15 DIAGNOSIS — I10 Essential (primary) hypertension: Secondary | ICD-10-CM | POA: Diagnosis not present

## 2012-05-15 DIAGNOSIS — E119 Type 2 diabetes mellitus without complications: Secondary | ICD-10-CM | POA: Diagnosis not present

## 2012-05-15 DIAGNOSIS — E785 Hyperlipidemia, unspecified: Secondary | ICD-10-CM | POA: Diagnosis not present

## 2012-05-15 DIAGNOSIS — Z23 Encounter for immunization: Secondary | ICD-10-CM

## 2012-05-15 LAB — CBC WITH DIFFERENTIAL/PLATELET
Basophils Absolute: 0 10*3/uL (ref 0.0–0.1)
Basophils Relative: 0.4 % (ref 0.0–3.0)
Eosinophils Absolute: 0.3 10*3/uL (ref 0.0–0.7)
HCT: 35.8 % — ABNORMAL LOW (ref 36.0–46.0)
Hemoglobin: 11.7 g/dL — ABNORMAL LOW (ref 12.0–15.0)
Lymphs Abs: 2.3 10*3/uL (ref 0.7–4.0)
MCHC: 32.8 g/dL (ref 30.0–36.0)
MCV: 81.7 fl (ref 78.0–100.0)
Monocytes Absolute: 1.2 10*3/uL — ABNORMAL HIGH (ref 0.1–1.0)
Neutro Abs: 5.9 10*3/uL (ref 1.4–7.7)
RBC: 4.38 Mil/uL (ref 3.87–5.11)
RDW: 15.9 % — ABNORMAL HIGH (ref 11.5–14.6)

## 2012-05-15 LAB — BASIC METABOLIC PANEL
CO2: 27 mEq/L (ref 19–32)
Calcium: 9.3 mg/dL (ref 8.4–10.5)
GFR: 62.21 mL/min (ref >60.00)
Sodium: 137 mEq/L (ref 135–145)

## 2012-05-15 LAB — HEPATIC FUNCTION PANEL
Alkaline Phosphatase: 92 U/L (ref 39–117)
Bilirubin, Direct: 0.1 mg/dL (ref 0.0–0.3)
Total Bilirubin: 0.4 mg/dL (ref 0.3–1.2)
Total Protein: 7.7 g/dL (ref 6.0–8.3)

## 2012-05-15 LAB — LIPID PANEL
Total CHOL/HDL Ratio: 7
Triglycerides: 193 mg/dL — ABNORMAL HIGH (ref 0.0–149.0)
VLDL: 38.6 mg/dL (ref 0.0–40.0)

## 2012-05-15 LAB — URINALYSIS, ROUTINE W REFLEX MICROSCOPIC
Bilirubin Urine: NEGATIVE
Ketones, ur: NEGATIVE
Nitrite: NEGATIVE
Urobilinogen, UA: 0.2 (ref 0.0–1.0)
pH: 5.5 (ref 5.0–8.0)

## 2012-05-15 LAB — LDL CHOLESTEROL, DIRECT: Direct LDL: 142.6 mg/dL

## 2012-05-15 LAB — HEMOGLOBIN A1C: Hgb A1c MFr Bld: 6.5 % (ref 4.6–6.5)

## 2012-05-15 MED ORDER — PRAVASTATIN SODIUM 20 MG PO TABS: 20.0000 mg | ORAL_TABLET | ORAL | Status: DC

## 2012-05-15 NOTE — Assessment & Plan Note (Signed)
Mild symptoms, for otc allegra prn

## 2012-05-15 NOTE — Patient Instructions (Addendum)
Please take all new medication as prescribed Please continue all other medications as before, and refills have been done if requested. Please have the pharmacy call with any other refills you may need. You had the "Td" tetanus shot today Please go to the LAB in the Basement (turn left off the elevator) for the tests to be done today You will be contacted by phone if any changes need to be made immediately.  Otherwise, you will receive a letter about your results with an explanation Please remember to sign up for My Chart if you have not done so, as this will be important to you in the future with finding out test results, communicating by private email, and scheduling acute appointments online when needed. Please return in 6 months, or sooner if needed

## 2012-05-15 NOTE — Progress Notes (Signed)
Subjective:    Patient ID: Misty [name], female    DOB: April 01, 1940, 72 y.o.   MRN: [id number]  HPI  Here to f/u; overall doing ok,  Pt denies chest pain, increased sob or doe, wheezing, orthopnea, PND, increased LE swelling, palpitations, dizziness or syncope.  Pt denies polydipsia, polyuria, or low sugar symptoms such as weakness or confusion improved with po intake.  Pt denies new neurological symptoms such as new headache, or facial or extremity weakness or numbness.   Pt states overall good compliance with meds, has been trying to follow lower cholesterol, diabetic diet, with wt overall stable,  but little exercise however  Wt actually down about 15 lbs per pt in the past yr intentionally. Does have several wks ongoing nasal allergy symptoms with clearish congestion, itch and sneezing, without fever, pain, ST, cough, swelling or wheezing.  Has been intolerant of 3 statins in the past, but willing to try again qod use. Past Medical History  Diagnosis Date  . Acute bronchitis 07/06/2008  . ALLERGIC RHINITIS 02/12/2008  . ANEMIA-IRON DEFICIENCY 02/12/2008  . DIABETES MELLITUS, TYPE II 02/12/2008  . FATIGUE 02/12/2008  . HYPERLIPIDEMIA 02/12/2008  . HYPERTENSION 11/05/2006  . KNEE PAIN, LEFT, ACUTE 05/11/2010  . LOW BACK PAIN 02/12/2008  . OSTEOPENIA 02/12/2008  . PEPTIC ULCER DISEASE 02/12/2008  . SINUSITIS- ACUTE-NOS 10/08/2008   Past Surgical History  Procedure Laterality Date  . Esophagogastroduodenoscopy  1970  . Appendectomy    . Abdominal hysterectomy      reports that she has quit smoking. She does not have any smokeless tobacco history on file. She reports that  drinks alcohol. She reports that she does not use illicit drugs. family history includes Cancer in her brother and other; Colon polyps (age of onset: 39) in her brother; Coronary artery disease in her brother; Depression in her child; Hypertension in her brother; and Mental illness in her other. Allergies  Allergen  Reactions  . Lipitor (Atorvastatin Calcium) Nausea Only  . Lovastatin Nausea Only  . Simvastatin     REACTION: gi upset   Current Outpatient Prescriptions on File Prior to Visit  Medication Sig Dispense Refill  . aspirin 81 MG tablet Take 81 mg by mouth daily.        . valsartan-hydrochlorothiazide (DIOVAN-HCT) 320-25 MG per tablet TAKE 1 TABLET BY MOUTH DAILY.  90 tablet  2   No current facility-administered medications on file prior to visit.   Review of Systems  Constitutional: Negative for unexpected weight change, or unusual diaphoresis  HENT: Negative for tinnitus.   Eyes: Negative for photophobia and visual disturbance.  Respiratory: Negative for choking and stridor.   Gastrointestinal: Negative for vomiting and blood in stool.  Genitourinary: Negative for hematuria and decreased urine volume.  Musculoskeletal: Negative for acute joint swelling Skin: Negative for color change and wound.  Neurological: Negative for tremors and numbness other than noted  Psychiatric/Behavioral: Negative for decreased concentration or  hyperactivity.  '     Objective:   Physical Exam BP 122/70  Pulse 88  Temp(Src) 98.2 F (36.8 C) (Oral)  Ht 5' 3.5" (1.613 m)  Wt 166 lb 12 oz (75.637 kg)  BMI 29.07 kg/m2  SpO2 96% VS noted,  Constitutional: Pt appears well-developed and well-nourished.  HENT: Head: NCAT.  Right Ear: External ear normal.  Left Ear: External ear normal.  Eyes: Conjunctivae and EOM are normal. Pupils are equal, round, and reactive to light.  Neck: Normal range of motion. Neck supple.  Cardiovascular: Normal rate and regular rhythm.   Pulmonary/Chest: Effort normal and breath sounds normal.  Abd:  Soft, NT, non-distended, + BS Neurological: Pt is alert. Not confused  Skin: Skin is warm. No erythema.  Psychiatric: Pt behavior is normal. Thought content normal.     Assessment & Plan:

## 2012-05-15 NOTE — Assessment & Plan Note (Addendum)
ECG reviewed as per emr, o/ stable overall by history and exam, recent data reviewed with pt, and pt to continue medical treatment as before,  to f/u any worsening symptoms or concerns BP Readings from Last 3 Encounters:  05/15/12 122/70  11/08/11 120/70  05/10/11 102/62

## 2012-05-15 NOTE — Assessment & Plan Note (Signed)
stable overall by history and exam, recent data reviewed with pt, and pt to continue medical treatment as before,  to f/u any worsening symptoms or concerns Lab Results  Component Value Date   LDLCALC 95 03/12/2008

## 2012-05-15 NOTE — Addendum Note (Signed)
Addended by: Scharlene Gloss B on: 05/15/2012 10:17 AM   Modules accepted: Orders

## 2012-05-15 NOTE — Assessment & Plan Note (Signed)
stable overall by history and exam, recent data reviewed with pt, and pt to continue medical treatment as before,  to f/u any worsening symptoms or concerns Lab Results  Component Value Date   HGBA1C 6.4 11/08/2011   For lab f/u today

## 2012-11-13 ENCOUNTER — Ambulatory Visit (INDEPENDENT_AMBULATORY_CARE_PROVIDER_SITE_OTHER): Payer: Medicare Other | Admitting: Internal Medicine

## 2012-11-13 ENCOUNTER — Encounter: Payer: Self-pay | Admitting: Internal Medicine

## 2012-11-13 VITALS — BP 120/80 | HR 86 | Temp 97.3°F | Ht 63.5 in | Wt 167.0 lb

## 2012-11-13 DIAGNOSIS — E785 Hyperlipidemia, unspecified: Secondary | ICD-10-CM

## 2012-11-13 DIAGNOSIS — I10 Essential (primary) hypertension: Secondary | ICD-10-CM

## 2012-11-13 DIAGNOSIS — E119 Type 2 diabetes mellitus without complications: Secondary | ICD-10-CM

## 2012-11-13 DIAGNOSIS — M2550 Pain in unspecified joint: Secondary | ICD-10-CM

## 2012-11-13 NOTE — Patient Instructions (Signed)
OK to hold on taking the Lipitor for 1 month, then re-start Otherwise - Please continue all other medications as before, and refills have been done if requested. No blood work needed today Please have the pharmacy call with any other refills you may need.  Please remember to sign up for My Chart if you have not done so, as this will be important to you in the future with finding out test results, communicating by private email, and scheduling acute appointments online when needed.  Please return in 6 months, or sooner if needed, with Lab testing done 3-5 days before

## 2012-11-13 NOTE — Assessment & Plan Note (Signed)
Etiology unclear, for advil prn as well, suspect MSK pain due to ? OA pain but cant r/o lipitor

## 2012-11-13 NOTE — Assessment & Plan Note (Signed)
stable overall by history and exam, recent data reviewed with pt, and pt to continue medical treatment as before,  to f/u any worsening symptoms or concerns Lab Results  Component Value Date   HGBA1C 6.5 05/15/2012   Will hold on repeat a1c today, pt just feels she doesn't need at this time

## 2012-11-13 NOTE — Progress Notes (Signed)
Subjective:    Patient ID: Misty [name], female    DOB: [birth year], 72 y.o.   MRN: [id number]  HPI  Here to f/u, c/o mild to mod several wks worsening arthralgias to mult joints seemingly random to extremities without swelling, fever, trauma, or hx of gout.  No current swelling or tenderness.  Stopped her lipitor x 3 days and ? Improved pain.  Advil helps as well. Pt denies chest pain, increased sob or doe, wheezing, orthopnea, PND, increased LE swelling, palpitations, dizziness or syncope.   Pt denies polydipsia, polyuria, Pt states overall good compliance with meds, trying to follow lower cholesterol diet.  Pt denies new neurological symptoms such as new headache, or facial or extremity weakness or numbness Pt would like to not do labs today if possible, feels like she is stable Past Medical History  Diagnosis Date  . Acute bronchitis 07/06/2008  . ALLERGIC RHINITIS 02/12/2008  . ANEMIA-IRON DEFICIENCY 02/12/2008  . DIABETES MELLITUS, TYPE II 02/12/2008  . FATIGUE 02/12/2008  . HYPERLIPIDEMIA 02/12/2008  . HYPERTENSION 11/05/2006  . KNEE PAIN, LEFT, ACUTE 05/11/2010  . LOW BACK PAIN 02/12/2008  . OSTEOPENIA 02/12/2008  . PEPTIC ULCER DISEASE 02/12/2008  . SINUSITIS- ACUTE-NOS 10/08/2008   Past Surgical History  Procedure Laterality Date  . Esophagogastroduodenoscopy  1970  . Appendectomy    . Abdominal hysterectomy      reports that she has quit smoking. She does not have any smokeless tobacco history on file. She reports that  drinks alcohol. She reports that she does not use illicit drugs. family history includes Cancer in her brother and other; Colon polyps (age of onset: 51) in her brother; Coronary artery disease in her brother; Depression in her child; Hypertension in her brother; Mental illness in her other. Allergies  Allergen Reactions  . Lipitor [Atorvastatin Calcium] Nausea Only  . Lovastatin Nausea Only  . Simvastatin     REACTION: gi upset   Current Outpatient  Prescriptions on File Prior to Visit  Medication Sig Dispense Refill  . aspirin 81 MG tablet Take 81 mg by mouth daily.        . valsartan-hydrochlorothiazide (DIOVAN-HCT) 320-25 MG per tablet TAKE 1 TABLET BY MOUTH DAILY.  90 tablet  2   No current facility-administered medications on file prior to visit.   Review of Systems  Constitutional: Negative for unexpected weight change, or unusual diaphoresis  HENT: Negative for tinnitus.   Eyes: Negative for photophobia and visual disturbance.  Respiratory: Negative for choking and stridor.   Gastrointestinal: Negative for vomiting and blood in stool.  Genitourinary: Negative for hematuria and decreased urine volume.  Musculoskeletal: Negative for acute joint swelling Skin: Negative for color change and wound.  Neurological: Negative for tremors and numbness other than noted  Psychiatric/Behavioral: Negative for decreased concentration or  hyperactivity.       Objective:   Physical Exam BP 120/80  Pulse 86  Temp(Src) 97.3 F (36.3 C) (Oral)  Ht 5' 3.5" (1.613 m)  Wt 167 lb (75.751 kg)  BMI 29.12 kg/m2  SpO2 98% VS noted, not ill appearing Constitutional: Pt appears well-developed and well-nourished.  HENT: Head: NCAT.  Right Ear: External ear normal.  Left Ear: External ear normal.  Eyes: Conjunctivae and EOM are normal. Pupils are equal, round, and reactive to light.  Neck: Normal range of motion. Neck supple.  Cardiovascular: Normal rate and regular rhythm.   Pulmonary/Chest: Effort normal and breath sounds normal.  Joint exam - no effusion or tenderness to  shoudlers, elbow, wrist, knees, ankles or spine Neurological: Pt is alert. Not confused , motor 5/5 Skin: Skin is warm. No erythema.  Psychiatric: Pt behavior is normal. Thought content normal.     Assessment & Plan:

## 2012-11-13 NOTE — Assessment & Plan Note (Signed)
Ok to hold on taking lipitor for 1 mo, but re-start if joint pain not resolved

## 2012-12-09 ENCOUNTER — Emergency Department (HOSPITAL_COMMUNITY): Payer: No Typology Code available for payment source

## 2012-12-09 ENCOUNTER — Encounter (HOSPITAL_COMMUNITY): Payer: Self-pay | Admitting: *Deleted

## 2012-12-09 ENCOUNTER — Emergency Department (HOSPITAL_COMMUNITY)
Admission: EM | Admit: 2012-12-09 | Discharge: 2012-12-09 | Disposition: A | Discharge status: Home / Self Care | Payer: No Typology Code available for payment source | Attending: Emergency Medicine | Admitting: Emergency Medicine

## 2012-12-09 DIAGNOSIS — S139XXA Sprain of joints and ligaments of unspecified parts of neck, initial encounter: Secondary | ICD-10-CM | POA: Insufficient documentation

## 2012-12-09 DIAGNOSIS — Y9389 Activity, other specified: Secondary | ICD-10-CM | POA: Insufficient documentation

## 2012-12-09 DIAGNOSIS — S161XXA Strain of muscle, fascia and tendon at neck level, initial encounter: Secondary | ICD-10-CM

## 2012-12-09 DIAGNOSIS — Z8639 Personal history of other endocrine, nutritional and metabolic disease: Secondary | ICD-10-CM | POA: Insufficient documentation

## 2012-12-09 DIAGNOSIS — Z862 Personal history of diseases of the blood and blood-forming organs and certain disorders involving the immune mechanism: Secondary | ICD-10-CM | POA: Insufficient documentation

## 2012-12-09 DIAGNOSIS — Z8711 Personal history of peptic ulcer disease: Secondary | ICD-10-CM | POA: Insufficient documentation

## 2012-12-09 DIAGNOSIS — Y92410 Unspecified street and highway as the place of occurrence of the external cause: Secondary | ICD-10-CM | POA: Insufficient documentation

## 2012-12-09 DIAGNOSIS — M542 Cervicalgia: Secondary | ICD-10-CM | POA: Diagnosis not present

## 2012-12-09 DIAGNOSIS — S0993XA Unspecified injury of face, initial encounter: Secondary | ICD-10-CM | POA: Diagnosis not present

## 2012-12-09 DIAGNOSIS — I10 Essential (primary) hypertension: Secondary | ICD-10-CM | POA: Insufficient documentation

## 2012-12-09 DIAGNOSIS — E119 Type 2 diabetes mellitus without complications: Secondary | ICD-10-CM | POA: Insufficient documentation

## 2012-12-09 DIAGNOSIS — M545 Low back pain: Secondary | ICD-10-CM | POA: Diagnosis not present

## 2012-12-09 DIAGNOSIS — M79609 Pain in unspecified limb: Secondary | ICD-10-CM | POA: Diagnosis not present

## 2012-12-09 DIAGNOSIS — Z7982 Long term (current) use of aspirin: Secondary | ICD-10-CM | POA: Insufficient documentation

## 2012-12-09 DIAGNOSIS — Z8739 Personal history of other diseases of the musculoskeletal system and connective tissue: Secondary | ICD-10-CM | POA: Insufficient documentation

## 2012-12-09 DIAGNOSIS — S298XXA Other specified injuries of thorax, initial encounter: Secondary | ICD-10-CM | POA: Diagnosis not present

## 2012-12-09 DIAGNOSIS — Z79899 Other long term (current) drug therapy: Secondary | ICD-10-CM | POA: Insufficient documentation

## 2012-12-09 DIAGNOSIS — T148XXA Other injury of unspecified body region, initial encounter: Secondary | ICD-10-CM | POA: Diagnosis not present

## 2012-12-09 DIAGNOSIS — Z8709 Personal history of other diseases of the respiratory system: Secondary | ICD-10-CM | POA: Insufficient documentation

## 2012-12-09 DIAGNOSIS — R079 Chest pain, unspecified: Secondary | ICD-10-CM | POA: Diagnosis not present

## 2012-12-09 MED ORDER — HYDROCODONE-ACETAMINOPHEN 5-325 MG PO TABS
2.0000 | ORAL_TABLET | Freq: Once | ORAL | Status: AC
  Administered 2012-12-09: 2 via ORAL
  Filled 2012-12-09: qty 2

## 2012-12-09 MED ORDER — TRAMADOL HCL 50 MG PO TABS: 50.0000 mg | ORAL_TABLET | Freq: Three times a day (TID) | ORAL | Status: DC | PRN
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
## 2012-12-22 ENCOUNTER — Ambulatory Visit (INDEPENDENT_AMBULATORY_CARE_PROVIDER_SITE_OTHER): Payer: Medicare Other | Admitting: Internal Medicine

## 2012-12-22 ENCOUNTER — Encounter: Payer: Self-pay | Admitting: Internal Medicine

## 2012-12-22 VITALS — BP 132/82 | HR 89 | Temp 98.6°F | Ht 63.0 in | Wt 167.2 lb

## 2012-12-22 DIAGNOSIS — S139XXA Sprain of joints and ligaments of unspecified parts of neck, initial encounter: Secondary | ICD-10-CM | POA: Diagnosis not present

## 2012-12-22 DIAGNOSIS — IMO0002 Reserved for concepts with insufficient information to code with codable children: Secondary | ICD-10-CM | POA: Diagnosis not present

## 2012-12-22 DIAGNOSIS — S134XXA Sprain of ligaments of cervical spine, initial encounter: Secondary | ICD-10-CM

## 2012-12-22 DIAGNOSIS — M542 Cervicalgia: Secondary | ICD-10-CM

## 2012-12-22 DIAGNOSIS — S43499A Other sprain of unspecified shoulder joint, initial encounter: Secondary | ICD-10-CM | POA: Diagnosis not present

## 2012-12-22 DIAGNOSIS — S4382XA Sprain of other specified parts of left shoulder girdle, initial encounter: Secondary | ICD-10-CM

## 2012-12-22 DIAGNOSIS — M5416 Radiculopathy, lumbar region: Secondary | ICD-10-CM

## 2012-12-22 MED ORDER — PREDNISONE 10 MG PO TABS: ORAL_TABLET | ORAL | Status: DC

## 2012-12-22 MED ORDER — HYDROCODONE-ACETAMINOPHEN 5-325 MG PO TABS: 1.0000 | ORAL_TABLET | Freq: Four times a day (QID) | ORAL | Status: DC | PRN

## 2012-12-22 MED ORDER — CYCLOBENZAPRINE HCL 5 MG PO TABS: 5.0000 mg | ORAL_TABLET | Freq: Three times a day (TID) | ORAL | Status: DC | PRN

## 2012-12-22 NOTE — Patient Instructions (Addendum)
Please take all new medication as prescribed - the pain medication, prednisone, and muscle relaxer all as prescribed You will be contacted regarding the referral for: MRI for the lower back, and Neurosurgury Please continue all other medications as before, and refills have been done if requested. Please have the pharmacy call with any other refills you may need.  Please remember to sign up for My Chart if you have not done so, as this will be important to you in the future with finding out test results, communicating by private email, and scheduling acute appointments online when needed.
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
## 2012-12-24 DIAGNOSIS — M542 Cervicalgia: Secondary | ICD-10-CM | POA: Insufficient documentation

## 2012-12-24 DIAGNOSIS — S4380XA Sprain of other specified parts of unspecified shoulder girdle, initial encounter: Secondary | ICD-10-CM | POA: Insufficient documentation

## 2012-12-24 DIAGNOSIS — S134XXA Sprain of ligaments of cervical spine, initial encounter: Secondary | ICD-10-CM | POA: Insufficient documentation

## 2012-12-24 NOTE — Assessment & Plan Note (Signed)
Mild to mod, for muscle relaxer prn,  to f/u any worsening symptoms or concerns 

## 2012-12-24 NOTE — Assessment & Plan Note (Signed)
Also c/w strain, for muscle relaxer prn,  to f/u any worsening symptoms or concerns

## 2012-12-24 NOTE — Assessment & Plan Note (Signed)
Mild to mod, for pain control, steroid trial, MRI LS Spine, refer for surgury eval,  to f/u any worsening symptoms or concerns

## 2012-12-26 DIAGNOSIS — M545 Low back pain: Secondary | ICD-10-CM | POA: Diagnosis not present

## 2012-12-26 DIAGNOSIS — M542 Cervicalgia: Secondary | ICD-10-CM | POA: Diagnosis not present

## 2012-12-26 DIAGNOSIS — M79609 Pain in unspecified limb: Secondary | ICD-10-CM | POA: Diagnosis not present

## 2013-01-04 ENCOUNTER — Ambulatory Visit
Admission: RE | Admit: 2013-01-04 | Discharge: 2013-01-04 | Disposition: A | Discharge status: Home / Self Care | Payer: Self-pay | Source: Ambulatory Visit | Attending: Internal Medicine | Admitting: Internal Medicine

## 2013-01-04 ENCOUNTER — Encounter: Payer: Self-pay | Admitting: Internal Medicine

## 2013-01-04 DIAGNOSIS — M5416 Radiculopathy, lumbar region: Secondary | ICD-10-CM

## 2013-01-04 DIAGNOSIS — M47817 Spondylosis without myelopathy or radiculopathy, lumbosacral region: Secondary | ICD-10-CM | POA: Diagnosis not present

## 2013-01-12 ENCOUNTER — Other Ambulatory Visit: Payer: Self-pay | Admitting: Internal Medicine

## 2013-01-16 ENCOUNTER — Telehealth: Payer: Self-pay | Admitting: Internal Medicine

## 2013-01-16 MED ORDER — HYDROCODONE-ACETAMINOPHEN 5-325 MG PO TABS: 1.0000 | ORAL_TABLET | Freq: Four times a day (QID) | ORAL | Status: DC | PRN

## 2013-01-16 NOTE — Telephone Encounter (Signed)
rx Done hardcopy to robin  Not sure what to say about f/u here, as most often the back pain is treated by the surgeon at the referral

## 2013-01-16 NOTE — Telephone Encounter (Signed)
Patient is requesting a refill on what she thinks is hydrocodone.  She said that the pharmacy was suppose to get in touch as well.  She also wants to let Dr. Jonny Ruiz know that she had an appt with [facility] and they scheduled her appt for a day the Dr. Was not in and is unhappy with them.  She said that they did reschedule her appt to Monday 10/27 and she would like to know if she needs to follow up with Dr. Jonny Ruiz after that. She said that her hip hurts once her meds wear off.  Please give a call in regards.

## 2013-01-17 NOTE — Telephone Encounter (Signed)
Called the patient informed of MD instructions and informed to pickup hardcopy at the front desk.

## 2013-01-22 DIAGNOSIS — M4310 Spondylolisthesis, site unspecified: Secondary | ICD-10-CM | POA: Diagnosis not present

## 2013-01-22 DIAGNOSIS — M48061 Spinal stenosis, lumbar region without neurogenic claudication: Secondary | ICD-10-CM | POA: Diagnosis not present

## 2013-01-23 DIAGNOSIS — M5412 Radiculopathy, cervical region: Secondary | ICD-10-CM | POA: Diagnosis not present

## 2013-01-23 DIAGNOSIS — M545 Low back pain: Secondary | ICD-10-CM | POA: Diagnosis not present

## 2013-01-23 DIAGNOSIS — M542 Cervicalgia: Secondary | ICD-10-CM | POA: Diagnosis not present

## 2013-01-23 DIAGNOSIS — M6281 Muscle weakness (generalized): Secondary | ICD-10-CM | POA: Diagnosis not present

## 2013-01-25 DIAGNOSIS — M542 Cervicalgia: Secondary | ICD-10-CM | POA: Diagnosis not present

## 2013-01-25 DIAGNOSIS — M5412 Radiculopathy, cervical region: Secondary | ICD-10-CM | POA: Diagnosis not present

## 2013-01-25 DIAGNOSIS — M545 Low back pain: Secondary | ICD-10-CM | POA: Diagnosis not present

## 2013-01-25 DIAGNOSIS — M6281 Muscle weakness (generalized): Secondary | ICD-10-CM | POA: Diagnosis not present

## 2013-01-29 DIAGNOSIS — M545 Low back pain: Secondary | ICD-10-CM | POA: Diagnosis not present

## 2013-01-29 DIAGNOSIS — M542 Cervicalgia: Secondary | ICD-10-CM | POA: Diagnosis not present

## 2013-01-29 DIAGNOSIS — M5412 Radiculopathy, cervical region: Secondary | ICD-10-CM | POA: Diagnosis not present

## 2013-01-29 DIAGNOSIS — M6281 Muscle weakness (generalized): Secondary | ICD-10-CM | POA: Diagnosis not present

## 2013-02-01 DIAGNOSIS — M5412 Radiculopathy, cervical region: Secondary | ICD-10-CM | POA: Diagnosis not present

## 2013-02-01 DIAGNOSIS — M6281 Muscle weakness (generalized): Secondary | ICD-10-CM | POA: Diagnosis not present

## 2013-02-01 DIAGNOSIS — M545 Low back pain: Secondary | ICD-10-CM | POA: Diagnosis not present

## 2013-02-01 DIAGNOSIS — M542 Cervicalgia: Secondary | ICD-10-CM | POA: Diagnosis not present

## 2013-02-05 DIAGNOSIS — M6281 Muscle weakness (generalized): Secondary | ICD-10-CM | POA: Diagnosis not present

## 2013-02-05 DIAGNOSIS — M545 Low back pain: Secondary | ICD-10-CM | POA: Diagnosis not present

## 2013-02-05 DIAGNOSIS — M542 Cervicalgia: Secondary | ICD-10-CM | POA: Diagnosis not present

## 2013-02-05 DIAGNOSIS — M5412 Radiculopathy, cervical region: Secondary | ICD-10-CM | POA: Diagnosis not present

## 2013-02-08 DIAGNOSIS — M5412 Radiculopathy, cervical region: Secondary | ICD-10-CM | POA: Diagnosis not present

## 2013-02-08 DIAGNOSIS — M542 Cervicalgia: Secondary | ICD-10-CM | POA: Diagnosis not present

## 2013-02-08 DIAGNOSIS — M6281 Muscle weakness (generalized): Secondary | ICD-10-CM | POA: Diagnosis not present

## 2013-02-08 DIAGNOSIS — M545 Low back pain: Secondary | ICD-10-CM | POA: Diagnosis not present

## 2013-02-12 DIAGNOSIS — M542 Cervicalgia: Secondary | ICD-10-CM | POA: Diagnosis not present

## 2013-02-12 DIAGNOSIS — M5412 Radiculopathy, cervical region: Secondary | ICD-10-CM | POA: Diagnosis not present

## 2013-02-12 DIAGNOSIS — M6281 Muscle weakness (generalized): Secondary | ICD-10-CM | POA: Diagnosis not present

## 2013-02-12 DIAGNOSIS — M545 Low back pain: Secondary | ICD-10-CM | POA: Diagnosis not present

## 2013-02-15 DIAGNOSIS — M5412 Radiculopathy, cervical region: Secondary | ICD-10-CM | POA: Diagnosis not present

## 2013-02-15 DIAGNOSIS — M545 Low back pain: Secondary | ICD-10-CM | POA: Diagnosis not present

## 2013-02-15 DIAGNOSIS — M542 Cervicalgia: Secondary | ICD-10-CM | POA: Diagnosis not present

## 2013-02-15 DIAGNOSIS — M6281 Muscle weakness (generalized): Secondary | ICD-10-CM | POA: Diagnosis not present

## 2013-02-19 DIAGNOSIS — M6281 Muscle weakness (generalized): Secondary | ICD-10-CM | POA: Diagnosis not present

## 2013-02-19 DIAGNOSIS — M545 Low back pain: Secondary | ICD-10-CM | POA: Diagnosis not present

## 2013-02-19 DIAGNOSIS — M542 Cervicalgia: Secondary | ICD-10-CM | POA: Diagnosis not present

## 2013-02-19 DIAGNOSIS — M5412 Radiculopathy, cervical region: Secondary | ICD-10-CM | POA: Diagnosis not present

## 2013-02-20 DIAGNOSIS — M542 Cervicalgia: Secondary | ICD-10-CM | POA: Diagnosis not present

## 2013-02-20 DIAGNOSIS — M545 Low back pain: Secondary | ICD-10-CM | POA: Diagnosis not present

## 2013-02-20 DIAGNOSIS — M5412 Radiculopathy, cervical region: Secondary | ICD-10-CM | POA: Diagnosis not present

## 2013-02-20 DIAGNOSIS — M6281 Muscle weakness (generalized): Secondary | ICD-10-CM | POA: Diagnosis not present

## 2013-02-26 DIAGNOSIS — M6281 Muscle weakness (generalized): Secondary | ICD-10-CM | POA: Diagnosis not present

## 2013-02-26 DIAGNOSIS — M5412 Radiculopathy, cervical region: Secondary | ICD-10-CM | POA: Diagnosis not present

## 2013-02-26 DIAGNOSIS — M542 Cervicalgia: Secondary | ICD-10-CM | POA: Diagnosis not present

## 2013-02-26 DIAGNOSIS — M545 Low back pain: Secondary | ICD-10-CM | POA: Diagnosis not present

## 2013-03-01 ENCOUNTER — Encounter: Payer: Self-pay | Admitting: *Deleted

## 2013-03-01 DIAGNOSIS — M545 Low back pain: Secondary | ICD-10-CM | POA: Diagnosis not present

## 2013-03-01 DIAGNOSIS — M5412 Radiculopathy, cervical region: Secondary | ICD-10-CM | POA: Diagnosis not present

## 2013-03-01 DIAGNOSIS — M6281 Muscle weakness (generalized): Secondary | ICD-10-CM | POA: Diagnosis not present

## 2013-03-01 DIAGNOSIS — M542 Cervicalgia: Secondary | ICD-10-CM | POA: Diagnosis not present

## 2013-03-05 DIAGNOSIS — M6281 Muscle weakness (generalized): Secondary | ICD-10-CM | POA: Diagnosis not present

## 2013-03-05 DIAGNOSIS — M545 Low back pain: Secondary | ICD-10-CM | POA: Diagnosis not present

## 2013-03-05 DIAGNOSIS — M5412 Radiculopathy, cervical region: Secondary | ICD-10-CM | POA: Diagnosis not present

## 2013-03-05 DIAGNOSIS — M542 Cervicalgia: Secondary | ICD-10-CM | POA: Diagnosis not present

## 2013-03-08 DIAGNOSIS — M542 Cervicalgia: Secondary | ICD-10-CM | POA: Diagnosis not present

## 2013-03-08 DIAGNOSIS — M6281 Muscle weakness (generalized): Secondary | ICD-10-CM | POA: Diagnosis not present

## 2013-03-08 DIAGNOSIS — M5412 Radiculopathy, cervical region: Secondary | ICD-10-CM | POA: Diagnosis not present

## 2013-03-08 DIAGNOSIS — M545 Low back pain: Secondary | ICD-10-CM | POA: Diagnosis not present

## 2013-03-09 ENCOUNTER — Encounter: Payer: Self-pay | Admitting: Internal Medicine

## 2013-03-12 DIAGNOSIS — M542 Cervicalgia: Secondary | ICD-10-CM | POA: Diagnosis not present

## 2013-03-12 DIAGNOSIS — M5412 Radiculopathy, cervical region: Secondary | ICD-10-CM | POA: Diagnosis not present

## 2013-03-12 DIAGNOSIS — M6281 Muscle weakness (generalized): Secondary | ICD-10-CM | POA: Diagnosis not present

## 2013-03-12 DIAGNOSIS — M545 Low back pain: Secondary | ICD-10-CM | POA: Diagnosis not present

## 2013-03-13 DIAGNOSIS — M4310 Spondylolisthesis, site unspecified: Secondary | ICD-10-CM | POA: Diagnosis not present

## 2013-03-13 DIAGNOSIS — M542 Cervicalgia: Secondary | ICD-10-CM | POA: Diagnosis not present

## 2013-03-13 DIAGNOSIS — E669 Obesity, unspecified: Secondary | ICD-10-CM | POA: Diagnosis not present

## 2013-03-13 DIAGNOSIS — M48061 Spinal stenosis, lumbar region without neurogenic claudication: Secondary | ICD-10-CM | POA: Diagnosis not present

## 2013-03-15 DIAGNOSIS — M545 Low back pain: Secondary | ICD-10-CM | POA: Diagnosis not present

## 2013-03-15 DIAGNOSIS — M542 Cervicalgia: Secondary | ICD-10-CM | POA: Diagnosis not present

## 2013-03-15 DIAGNOSIS — M6281 Muscle weakness (generalized): Secondary | ICD-10-CM | POA: Diagnosis not present

## 2013-03-15 DIAGNOSIS — M5412 Radiculopathy, cervical region: Secondary | ICD-10-CM | POA: Diagnosis not present

## 2013-03-18 DIAGNOSIS — M5412 Radiculopathy, cervical region: Secondary | ICD-10-CM | POA: Diagnosis not present

## 2013-03-18 DIAGNOSIS — M542 Cervicalgia: Secondary | ICD-10-CM | POA: Diagnosis not present

## 2013-03-18 DIAGNOSIS — M545 Low back pain: Secondary | ICD-10-CM | POA: Diagnosis not present

## 2013-03-18 DIAGNOSIS — M6281 Muscle weakness (generalized): Secondary | ICD-10-CM | POA: Diagnosis not present

## 2013-03-19 DIAGNOSIS — M6281 Muscle weakness (generalized): Secondary | ICD-10-CM | POA: Diagnosis not present

## 2013-03-19 DIAGNOSIS — M542 Cervicalgia: Secondary | ICD-10-CM | POA: Diagnosis not present

## 2013-03-19 DIAGNOSIS — M5412 Radiculopathy, cervical region: Secondary | ICD-10-CM | POA: Diagnosis not present

## 2013-03-19 DIAGNOSIS — M545 Low back pain: Secondary | ICD-10-CM | POA: Diagnosis not present

## 2013-03-26 DIAGNOSIS — M5412 Radiculopathy, cervical region: Secondary | ICD-10-CM | POA: Diagnosis not present

## 2013-03-26 DIAGNOSIS — M6281 Muscle weakness (generalized): Secondary | ICD-10-CM | POA: Diagnosis not present

## 2013-03-26 DIAGNOSIS — M545 Low back pain: Secondary | ICD-10-CM | POA: Diagnosis not present

## 2013-03-26 DIAGNOSIS — M542 Cervicalgia: Secondary | ICD-10-CM | POA: Diagnosis not present

## 2013-03-27 DIAGNOSIS — M542 Cervicalgia: Secondary | ICD-10-CM | POA: Diagnosis not present

## 2013-03-27 DIAGNOSIS — M545 Low back pain: Secondary | ICD-10-CM | POA: Diagnosis not present

## 2013-03-27 DIAGNOSIS — M6281 Muscle weakness (generalized): Secondary | ICD-10-CM | POA: Diagnosis not present

## 2013-03-27 DIAGNOSIS — M5412 Radiculopathy, cervical region: Secondary | ICD-10-CM | POA: Diagnosis not present

## 2013-03-29 ENCOUNTER — Other Ambulatory Visit: Payer: Self-pay | Admitting: Internal Medicine

## 2013-04-03 DIAGNOSIS — M6281 Muscle weakness (generalized): Secondary | ICD-10-CM | POA: Diagnosis not present

## 2013-04-03 DIAGNOSIS — M545 Low back pain, unspecified: Secondary | ICD-10-CM | POA: Diagnosis not present

## 2013-04-03 DIAGNOSIS — M542 Cervicalgia: Secondary | ICD-10-CM | POA: Diagnosis not present

## 2013-04-03 DIAGNOSIS — M5412 Radiculopathy, cervical region: Secondary | ICD-10-CM | POA: Diagnosis not present

## 2013-04-05 DIAGNOSIS — M5412 Radiculopathy, cervical region: Secondary | ICD-10-CM | POA: Diagnosis not present

## 2013-04-05 DIAGNOSIS — M6281 Muscle weakness (generalized): Secondary | ICD-10-CM | POA: Diagnosis not present

## 2013-04-05 DIAGNOSIS — M545 Low back pain, unspecified: Secondary | ICD-10-CM | POA: Diagnosis not present

## 2013-04-05 DIAGNOSIS — M542 Cervicalgia: Secondary | ICD-10-CM | POA: Diagnosis not present

## 2013-04-10 DIAGNOSIS — M545 Low back pain, unspecified: Secondary | ICD-10-CM | POA: Diagnosis not present

## 2013-04-10 DIAGNOSIS — M6281 Muscle weakness (generalized): Secondary | ICD-10-CM | POA: Diagnosis not present

## 2013-04-10 DIAGNOSIS — M5412 Radiculopathy, cervical region: Secondary | ICD-10-CM | POA: Diagnosis not present

## 2013-04-10 DIAGNOSIS — M542 Cervicalgia: Secondary | ICD-10-CM | POA: Diagnosis not present

## 2013-04-12 DIAGNOSIS — M545 Low back pain, unspecified: Secondary | ICD-10-CM | POA: Diagnosis not present

## 2013-04-12 DIAGNOSIS — M5412 Radiculopathy, cervical region: Secondary | ICD-10-CM | POA: Diagnosis not present

## 2013-04-12 DIAGNOSIS — M4310 Spondylolisthesis, site unspecified: Secondary | ICD-10-CM | POA: Diagnosis not present

## 2013-04-12 DIAGNOSIS — M542 Cervicalgia: Secondary | ICD-10-CM | POA: Diagnosis not present

## 2013-04-12 DIAGNOSIS — M6281 Muscle weakness (generalized): Secondary | ICD-10-CM | POA: Diagnosis not present

## 2013-04-12 DIAGNOSIS — E669 Obesity, unspecified: Secondary | ICD-10-CM | POA: Diagnosis not present

## 2013-04-16 DIAGNOSIS — M545 Low back pain, unspecified: Secondary | ICD-10-CM | POA: Diagnosis not present

## 2013-04-16 DIAGNOSIS — M542 Cervicalgia: Secondary | ICD-10-CM | POA: Diagnosis not present

## 2013-04-16 DIAGNOSIS — M5412 Radiculopathy, cervical region: Secondary | ICD-10-CM | POA: Diagnosis not present

## 2013-04-16 DIAGNOSIS — M6281 Muscle weakness (generalized): Secondary | ICD-10-CM | POA: Diagnosis not present

## 2013-04-19 DIAGNOSIS — M5412 Radiculopathy, cervical region: Secondary | ICD-10-CM | POA: Diagnosis not present

## 2013-04-19 DIAGNOSIS — M542 Cervicalgia: Secondary | ICD-10-CM | POA: Diagnosis not present

## 2013-04-19 DIAGNOSIS — M6281 Muscle weakness (generalized): Secondary | ICD-10-CM | POA: Diagnosis not present

## 2013-04-19 DIAGNOSIS — M545 Low back pain, unspecified: Secondary | ICD-10-CM | POA: Diagnosis not present

## 2013-04-23 DIAGNOSIS — M542 Cervicalgia: Secondary | ICD-10-CM | POA: Diagnosis not present

## 2013-04-23 DIAGNOSIS — M5412 Radiculopathy, cervical region: Secondary | ICD-10-CM | POA: Diagnosis not present

## 2013-04-23 DIAGNOSIS — M545 Low back pain, unspecified: Secondary | ICD-10-CM | POA: Diagnosis not present

## 2013-04-23 DIAGNOSIS — M6281 Muscle weakness (generalized): Secondary | ICD-10-CM | POA: Diagnosis not present

## 2013-04-26 DIAGNOSIS — M5412 Radiculopathy, cervical region: Secondary | ICD-10-CM | POA: Diagnosis not present

## 2013-04-26 DIAGNOSIS — M545 Low back pain, unspecified: Secondary | ICD-10-CM | POA: Diagnosis not present

## 2013-04-26 DIAGNOSIS — M542 Cervicalgia: Secondary | ICD-10-CM | POA: Diagnosis not present

## 2013-04-26 DIAGNOSIS — M6281 Muscle weakness (generalized): Secondary | ICD-10-CM | POA: Diagnosis not present

## 2013-04-30 DIAGNOSIS — M545 Low back pain, unspecified: Secondary | ICD-10-CM | POA: Diagnosis not present

## 2013-04-30 DIAGNOSIS — M542 Cervicalgia: Secondary | ICD-10-CM | POA: Diagnosis not present

## 2013-04-30 DIAGNOSIS — M5412 Radiculopathy, cervical region: Secondary | ICD-10-CM | POA: Diagnosis not present

## 2013-04-30 DIAGNOSIS — M6281 Muscle weakness (generalized): Secondary | ICD-10-CM | POA: Diagnosis not present

## 2013-05-04 DIAGNOSIS — M542 Cervicalgia: Secondary | ICD-10-CM | POA: Diagnosis not present

## 2013-05-04 DIAGNOSIS — M6281 Muscle weakness (generalized): Secondary | ICD-10-CM | POA: Diagnosis not present

## 2013-05-04 DIAGNOSIS — M5412 Radiculopathy, cervical region: Secondary | ICD-10-CM | POA: Diagnosis not present

## 2013-05-04 DIAGNOSIS — M545 Low back pain, unspecified: Secondary | ICD-10-CM | POA: Diagnosis not present

## 2013-05-10 DIAGNOSIS — M545 Low back pain, unspecified: Secondary | ICD-10-CM | POA: Diagnosis not present

## 2013-05-10 DIAGNOSIS — Z6829 Body mass index (BMI) 29.0-29.9, adult: Secondary | ICD-10-CM | POA: Diagnosis not present

## 2013-05-10 DIAGNOSIS — M4310 Spondylolisthesis, site unspecified: Secondary | ICD-10-CM | POA: Diagnosis not present

## 2013-05-15 ENCOUNTER — Ambulatory Visit: Payer: Medicare Other | Admitting: Internal Medicine

## 2013-05-22 ENCOUNTER — Ambulatory Visit: Payer: Medicare Other | Admitting: Internal Medicine

## 2013-05-29 ENCOUNTER — Encounter: Payer: Self-pay | Admitting: Internal Medicine

## 2013-05-29 ENCOUNTER — Ambulatory Visit (INDEPENDENT_AMBULATORY_CARE_PROVIDER_SITE_OTHER): Payer: Medicare Other | Admitting: Internal Medicine

## 2013-05-29 ENCOUNTER — Other Ambulatory Visit (INDEPENDENT_AMBULATORY_CARE_PROVIDER_SITE_OTHER): Payer: Medicare Other

## 2013-05-29 VITALS — BP 138/78 | HR 75 | Temp 98.2°F | Ht 63.0 in | Wt 167.1 lb

## 2013-05-29 DIAGNOSIS — E119 Type 2 diabetes mellitus without complications: Secondary | ICD-10-CM

## 2013-05-29 DIAGNOSIS — N649 Disorder of breast, unspecified: Secondary | ICD-10-CM | POA: Diagnosis not present

## 2013-05-29 DIAGNOSIS — Z23 Encounter for immunization: Secondary | ICD-10-CM | POA: Diagnosis not present

## 2013-05-29 DIAGNOSIS — N63 Unspecified lump in unspecified breast: Secondary | ICD-10-CM | POA: Diagnosis not present

## 2013-05-29 DIAGNOSIS — E785 Hyperlipidemia, unspecified: Secondary | ICD-10-CM | POA: Diagnosis not present

## 2013-05-29 DIAGNOSIS — Z0000 Encounter for general adult medical examination without abnormal findings: Secondary | ICD-10-CM

## 2013-05-29 LAB — CBC WITH DIFFERENTIAL/PLATELET
BASOS PCT: 0.4 % (ref 0.0–3.0)
Basophils Absolute: 0 10*3/uL (ref 0.0–0.1)
EOS PCT: 2.6 % (ref 0.0–5.0)
Eosinophils Absolute: 0.2 10*3/uL (ref 0.0–0.7)
HEMATOCRIT: 36.6 % (ref 36.0–46.0)
HEMOGLOBIN: 11.8 g/dL — AB (ref 12.0–15.0)
LYMPHS ABS: 2.3 10*3/uL (ref 0.7–4.0)
Lymphocytes Relative: 27.5 % (ref 12.0–46.0)
MCHC: 32.2 g/dL (ref 30.0–36.0)
MCV: 82.2 fl (ref 78.0–100.0)
MONOS PCT: 12.1 % — AB (ref 3.0–12.0)
Monocytes Absolute: 1 10*3/uL (ref 0.1–1.0)
NEUTROS ABS: 4.7 10*3/uL (ref 1.4–7.7)
Neutrophils Relative %: 57.4 % (ref 43.0–77.0)
Platelets: 335 10*3/uL (ref 150.0–400.0)
RBC: 4.45 Mil/uL (ref 3.87–5.11)
RDW: 15.3 % — ABNORMAL HIGH (ref 11.5–14.6)
WBC: 8.3 10*3/uL (ref 4.5–10.5)

## 2013-05-29 LAB — BASIC METABOLIC PANEL
BUN: 19 mg/dL (ref 6–23)
CALCIUM: 9.1 mg/dL (ref 8.4–10.5)
CO2: 28 mEq/L (ref 19–32)
Chloride: 104 mEq/L (ref 96–112)
Creatinine, Ser: 0.8 mg/dL (ref 0.4–1.2)
GFR: 73.66 mL/min (ref >60.00)
GLUCOSE: 96 mg/dL (ref 70–99)
Potassium: 4 mEq/L (ref 3.5–5.1)
Sodium: 137 mEq/L (ref 135–145)

## 2013-05-29 LAB — LIPID PANEL
CHOLESTEROL: 215 mg/dL — AB (ref 0–200)
HDL: 32.3 mg/dL — AB (ref >39.00)
LDL CALC: 149 mg/dL — AB (ref 0–99)
Total CHOL/HDL Ratio: 7
Triglycerides: 169 mg/dL — ABNORMAL HIGH (ref 0.0–149.0)
VLDL: 33.8 mg/dL (ref 0.0–40.0)

## 2013-05-29 LAB — URINALYSIS, ROUTINE W REFLEX MICROSCOPIC
Bilirubin Urine: NEGATIVE
KETONES UR: NEGATIVE
Nitrite: NEGATIVE
PH: 5.5 (ref 5.0–8.0)
SPECIFIC GRAVITY, URINE: 1.025 (ref 1.000–1.030)
TOTAL PROTEIN, URINE-UPE24: NEGATIVE
Urine Glucose: NEGATIVE
Urobilinogen, UA: 0.2 (ref 0.0–1.0)

## 2013-05-29 LAB — HEPATIC FUNCTION PANEL
ALBUMIN: 3.5 g/dL (ref 3.5–5.2)
ALT: 14 U/L (ref 0–35)
AST: 13 U/L (ref 0–37)
Alkaline Phosphatase: 95 U/L (ref 39–117)
Bilirubin, Direct: 0 mg/dL (ref 0.0–0.3)
Total Bilirubin: 0.4 mg/dL (ref 0.3–1.2)
Total Protein: 7.6 g/dL (ref 6.0–8.3)

## 2013-05-29 LAB — MICROALBUMIN / CREATININE URINE RATIO
Creatinine,U: 136.2 mg/dL
Microalb Creat Ratio: 1.3 mg/g (ref 0.0–30.0)
Microalb, Ur: 1.8 mg/dL (ref 0.0–1.9)

## 2013-05-29 LAB — HEMOGLOBIN A1C: HEMOGLOBIN A1C: 6.6 % — AB (ref 4.6–6.5)

## 2013-05-29 LAB — TSH: TSH: 2.06 u[IU]/mL (ref 0.35–5.50)

## 2013-05-29 NOTE — Patient Instructions (Addendum)
You had the new Prevnar pneumonia shot today Please continue all other medications as before, and refills have been done if requested. Please have the pharmacy call with any other refills you may need. Please continue your efforts at being more active, low cholesterol diet, and weight control. You are otherwise up to date with prevention measures today.  Please keep your appointments with your specialists as you have planned  Please call for your yearly mammogram at [facility] Please call if you change your mind about the colonoscopy  Please go to the LAB in the Basement (turn left off the elevator) for the tests to be done today You will be contacted by phone if any changes need to be made immediately.  Otherwise, you will receive a letter about your results with an explanation, but please check with MyChart first.  Please return in 6 months, or sooner if needed
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
## 2013-05-29 NOTE — Progress Notes (Signed)
Pre visit review using our clinic review tool, if applicable. No additional management support is needed unless otherwise documented below in the visit note. 

## 2013-05-29 NOTE — Assessment & Plan Note (Signed)

## 2013-05-29 NOTE — Addendum Note (Signed)
Addended by: Sharon Seller B on: 05/29/2013 09:21 AM   Modules accepted: Orders

## 2013-05-29 NOTE — Assessment & Plan Note (Signed)
stable overall by history and exam, recent data reviewed with pt, and pt to continue medical treatment as before,  to f/u any worsening symptoms or concerns Lab Results  Component Value Date   HGBA1C 6.5 05/15/2012

## 2013-11-29 ENCOUNTER — Ambulatory Visit (INDEPENDENT_AMBULATORY_CARE_PROVIDER_SITE_OTHER): Payer: Medicare Other | Admitting: Internal Medicine

## 2013-11-29 ENCOUNTER — Encounter: Payer: Self-pay | Admitting: Internal Medicine

## 2013-11-29 VITALS — BP 122/82 | HR 74 | Temp 98.5°F | Wt 161.0 lb

## 2013-11-29 DIAGNOSIS — M542 Cervicalgia: Secondary | ICD-10-CM | POA: Diagnosis not present

## 2013-11-29 DIAGNOSIS — E785 Hyperlipidemia, unspecified: Secondary | ICD-10-CM

## 2013-11-29 DIAGNOSIS — I10 Essential (primary) hypertension: Secondary | ICD-10-CM | POA: Diagnosis not present

## 2013-11-29 DIAGNOSIS — E119 Type 2 diabetes mellitus without complications: Secondary | ICD-10-CM | POA: Diagnosis not present

## 2013-11-29 MED ORDER — TIZANIDINE HCL 4 MG PO TABS: 4.0000 mg | ORAL_TABLET | Freq: Four times a day (QID) | ORAL | Status: DC | PRN

## 2013-11-29 NOTE — Progress Notes (Signed)
Pre visit review using our clinic review tool, if applicable. No additional management support is needed unless otherwise documented below in the visit note. 

## 2013-11-29 NOTE — Patient Instructions (Signed)

## 2013-11-29 NOTE — Progress Notes (Signed)
Subjective:    Patient ID: Misty [name], female    DOB: [birth year], 73 y.o.   MRN: [id number]  HPI  Here to f/u; overall doing ok,  Pt denies chest pain, increased sob or doe, wheezing, orthopnea, PND, increased LE swelling, palpitations, dizziness or syncope.  Pt denies polydipsia, polyuria, or low sugar symptoms such as weakness or confusion improved with po intake.  Pt denies new neurological symptoms such as new headache, or facial or extremity weakness or numbness.   Pt states overall good compliance with meds, has been trying to follow lower cholesterol, diabetic diet, with wt overall stable,  but little exercise however.  Now ambulating much better, no cane or walker, still has some left upper back strain at times.  No recent falls, but has to be careful walking downstairs, uses railing.  Denies worsening depressive symptoms, suicidal ideation, or panic; has ongoing anxiety, not increased recently.  Pt continues to have recurring left post neck pain without change in severity, bowel or bladder change, fever, wt loss,  worsening UE/LE pain/numbness/weakness, gait change or falls.  Past Medical History  Diagnosis Date  . Acute bronchitis 07/06/2008  . ALLERGIC RHINITIS 02/12/2008  . ANEMIA-IRON DEFICIENCY 02/12/2008  . DIABETES MELLITUS, TYPE II 02/12/2008  . FATIGUE 02/12/2008  . HYPERLIPIDEMIA 02/12/2008  . HYPERTENSION 11/05/2006  . KNEE PAIN, LEFT, ACUTE 05/11/2010  . LOW BACK PAIN 02/12/2008  . OSTEOPENIA 02/12/2008  . PEPTIC ULCER DISEASE 02/12/2008  . SINUSITIS- ACUTE-NOS 10/08/2008   Past Surgical History  Procedure Laterality Date  . Esophagogastroduodenoscopy  1970  . Appendectomy    . Abdominal hysterectomy      reports that she has quit smoking. She does not have any smokeless tobacco history on file. She reports that she drinks alcohol. She reports that she does not use illicit drugs. family history includes Colon polyps (age of onset: 67) in her brother; Coronary artery  disease in her brother; Depression in her child; Hypertension in her brother; Mental illness in her other; Throat cancer in her other; Thyroid cancer in her brother. Allergies  Allergen Reactions  . Lipitor [Atorvastatin Calcium] Nausea Only  . Lovastatin Nausea Only  . Simvastatin     REACTION: gi upset  . Tramadol Other (See Comments)    sleepiness   Current Outpatient Prescriptions on File Prior to Visit  Medication Sig Dispense Refill  . aspirin 81 MG tablet Take 81 mg by mouth daily.        . valsartan-hydrochlorothiazide (DIOVAN-HCT) 320-25 MG per tablet TAKE 1 TABLET BY MOUTH DAILY.  90 tablet  2   No current facility-administered medications on file prior to visit.   Review of Systems  Constitutional: Negative for unusual diaphoresis or other sweats  HENT: Negative for ringing in ear Eyes: Negative for double vision or worsening visual disturbance.  Respiratory: Negative for choking and stridor.   Gastrointestinal: Negative for vomiting or other signifcant bowel change Genitourinary: Negative for hematuria or decreased urine volume.  Musculoskeletal: Negative for other MSK pain or swelling Skin: Negative for color change and worsening wound.  Neurological: Negative for tremors and numbness other than noted  Psychiatric/Behavioral: Negative for decreased concentration or agitation other than above       Objective:   Physical Exam BP 122/82  Pulse 74  Temp(Src) 98.5 F (36.9 C) (Oral)  Wt 161 lb (73.029 kg)  SpO2 97% VS noted,  Constitutional: Pt appears well-developed, well-nourished.  HENT: Head: NCAT.  Right Ear: External ear normal.  Left Ear: External ear normal.  Eyes: . Pupils are equal, round, and reactive to light. Conjunctivae and EOM are normal Neck: Normal range of motion. Neck supple.  Cardiovascular: Normal rate and regular rhythm.   Pulmonary/Chest: Effort normal and breath sounds normal.  Abd:  Soft, NT, ND, + BS Neurological: Pt is alert. Not  confused , motor grossly intact Skin: Skin is warm. No rash Psychiatric: Pt behavior is normal. No agitation.  Leftpost neck NT, FROM, no swelling, rash       Assessment & Plan:

## 2013-12-02 NOTE — Assessment & Plan Note (Signed)
stable overall by history and exam, recent data reviewed with pt, and pt to continue medical treatment as before,  to f/u any worsening symptoms or concerns Lab Results  Component Value Date   HGBA1C 6.6* 05/29/2013   For f/u a1c

## 2013-12-02 NOTE — Assessment & Plan Note (Signed)
stable overall by history and exam, recent data reviewed with pt, and pt to continue medical treatment as before,  to f/u any worsening symptoms or concerns BP Readings from Last 3 Encounters:  11/29/13 122/82  05/29/13 138/78  12/22/12 132/82

## 2013-12-02 NOTE — Assessment & Plan Note (Signed)
stable overall by history and exam, and pt to continue medical treatment as before,  to f/u any worsening symptoms or concerns 

## 2013-12-02 NOTE — Assessment & Plan Note (Signed)
stable overall by history and exam, recent data reviewed with pt, and pt to continue medical treatment as before,  to f/u any worsening symptoms or concerns Lab Results  Component Value Date   LDLCALC 149* 05/29/2013   Has been statin intolerant, for lower chol diet

## 2013-12-31 ENCOUNTER — Ambulatory Visit (HOSPITAL_COMMUNITY)
Admission: RE | Admit: 2013-12-31 | Discharge: 2013-12-31 | Disposition: A | Discharge status: Home / Self Care | Payer: Medicare Other | Source: Ambulatory Visit | Attending: Family Medicine | Admitting: Family Medicine

## 2013-12-31 ENCOUNTER — Ambulatory Visit (INDEPENDENT_AMBULATORY_CARE_PROVIDER_SITE_OTHER): Payer: Medicare Other | Admitting: Family Medicine

## 2013-12-31 VITALS — BP 122/86 | HR 84 | Temp 98.5°F | Resp 16 | Ht 62.0 in | Wt 160.0 lb

## 2013-12-31 DIAGNOSIS — A46 Erysipelas: Secondary | ICD-10-CM

## 2013-12-31 DIAGNOSIS — R220 Localized swelling, mass and lump, head: Secondary | ICD-10-CM | POA: Insufficient documentation

## 2013-12-31 DIAGNOSIS — E119 Type 2 diabetes mellitus without complications: Secondary | ICD-10-CM | POA: Diagnosis present

## 2013-12-31 DIAGNOSIS — D509 Iron deficiency anemia, unspecified: Secondary | ICD-10-CM | POA: Diagnosis present

## 2013-12-31 DIAGNOSIS — H05012 Cellulitis of left orbit: Secondary | ICD-10-CM

## 2013-12-31 DIAGNOSIS — Z87891 Personal history of nicotine dependence: Secondary | ICD-10-CM | POA: Diagnosis not present

## 2013-12-31 DIAGNOSIS — I10 Essential (primary) hypertension: Secondary | ICD-10-CM | POA: Diagnosis present

## 2013-12-31 DIAGNOSIS — Z79899 Other long term (current) drug therapy: Secondary | ICD-10-CM | POA: Diagnosis not present

## 2013-12-31 DIAGNOSIS — Z7982 Long term (current) use of aspirin: Secondary | ICD-10-CM | POA: Diagnosis not present

## 2013-12-31 DIAGNOSIS — E785 Hyperlipidemia, unspecified: Secondary | ICD-10-CM | POA: Diagnosis present

## 2013-12-31 DIAGNOSIS — H00036 Abscess of eyelid left eye, unspecified eyelid: Secondary | ICD-10-CM | POA: Diagnosis not present

## 2013-12-31 DIAGNOSIS — Z8249 Family history of ischemic heart disease and other diseases of the circulatory system: Secondary | ICD-10-CM | POA: Diagnosis not present

## 2013-12-31 DIAGNOSIS — J309 Allergic rhinitis, unspecified: Secondary | ICD-10-CM | POA: Diagnosis present

## 2013-12-31 DIAGNOSIS — N179 Acute kidney failure, unspecified: Secondary | ICD-10-CM | POA: Diagnosis not present

## 2013-12-31 DIAGNOSIS — L03211 Cellulitis of face: Secondary | ICD-10-CM | POA: Diagnosis present

## 2013-12-31 DIAGNOSIS — E876 Hypokalemia: Secondary | ICD-10-CM | POA: Diagnosis not present

## 2013-12-31 DIAGNOSIS — Z888 Allergy status to other drugs, medicaments and biological substances status: Secondary | ICD-10-CM | POA: Diagnosis not present

## 2013-12-31 DIAGNOSIS — B029 Zoster without complications: Secondary | ICD-10-CM | POA: Diagnosis present

## 2013-12-31 DIAGNOSIS — M8580 Other specified disorders of bone density and structure, unspecified site: Secondary | ICD-10-CM | POA: Diagnosis present

## 2013-12-31 DIAGNOSIS — M542 Cervicalgia: Secondary | ICD-10-CM | POA: Diagnosis present

## 2013-12-31 DIAGNOSIS — H05222 Edema of left orbit: Secondary | ICD-10-CM | POA: Diagnosis not present

## 2013-12-31 DIAGNOSIS — R197 Diarrhea, unspecified: Secondary | ICD-10-CM | POA: Diagnosis not present

## 2013-12-31 DIAGNOSIS — Z885 Allergy status to narcotic agent status: Secondary | ICD-10-CM | POA: Diagnosis not present

## 2013-12-31 DIAGNOSIS — Z8711 Personal history of peptic ulcer disease: Secondary | ICD-10-CM | POA: Diagnosis not present

## 2013-12-31 DIAGNOSIS — M5416 Radiculopathy, lumbar region: Secondary | ICD-10-CM | POA: Diagnosis present

## 2013-12-31 DIAGNOSIS — Z90710 Acquired absence of both cervix and uterus: Secondary | ICD-10-CM | POA: Diagnosis not present

## 2013-12-31 LAB — POCT CBC
GRANULOCYTE PERCENT: 58.4 % (ref 37–80)
HEMATOCRIT: 40.9 % (ref 37.7–47.9)
HEMOGLOBIN: 12.6 g/dL (ref 12.2–16.2)
LYMPH, POC: 2.8 (ref 0.6–3.4)
MCH, POC: 26.1 pg — AB (ref 27–31.2)
MCHC: 30.8 g/dL — AB (ref 31.8–35.4)
MCV: 84.6 fL (ref 80–97)
MID (cbc): 1 — AB (ref 0–0.9)
MPV: 7.2 fL (ref 0–99.8)
POC GRANULOCYTE: 5.4 (ref 2–6.9)
POC LYMPH PERCENT: 30.5 %L (ref 10–50)
POC MID %: 11.1 %M (ref 0–12)
Platelet Count, POC: 334 10*3/uL (ref 142–424)
RBC: 4.83 M/uL (ref 4.04–5.48)
RDW, POC: 16.4 %
WBC: 9.2 10*3/uL (ref 4.6–10.2)

## 2013-12-31 LAB — POCT I-STAT CREATININE: CREATININE: 1.1 mg/dL (ref 0.50–1.10)

## 2013-12-31 LAB — POCT SEDIMENTATION RATE: POCT SED RATE: 42 mm/h — AB (ref 0–22)

## 2013-12-31 MED ORDER — IOHEXOL 300 MG/ML  SOLN
80.0000 mL | Freq: Once | INTRAMUSCULAR | Status: AC | PRN
  Administered 2013-12-31: 80 mL via INTRAVENOUS

## 2013-12-31 MED ORDER — CLINDAMYCIN HCL 300 MG PO CAPS: 300.0000 mg | ORAL_CAPSULE | Freq: Three times a day (TID) | ORAL | Status: DC

## 2013-12-31 NOTE — Patient Instructions (Addendum)
Cellulitis Cellulitis is an infection of the skin and the tissue beneath it. The infected area is usually red and tender. Cellulitis occurs most often in the arms and lower legs.  CAUSES  Cellulitis is caused by bacteria that enter the skin through cracks or cuts in the skin. The most common types of bacteria that cause cellulitis are staphylococci and streptococci. SIGNS AND SYMPTOMS   Redness and warmth.  Swelling.  Tenderness or pain.  Fever. DIAGNOSIS  Your health care provider can usually determine what is wrong based on a physical exam. Blood tests may also be done. TREATMENT  Treatment usually involves taking an antibiotic medicine. HOME CARE INSTRUCTIONS   Take your antibiotic medicine as directed by your health care provider. Finish the antibiotic even if you start to feel better.  Keep the infected arm or leg elevated to reduce swelling.  Apply a warm cloth to the affected area up to 4 times per day to relieve pain.  Take medicines only as directed by your health care provider.  Keep all follow-up visits as directed by your health care provider. SEEK MEDICAL CARE IF:   You notice red streaks coming from the infected area.  Your red area gets larger or turns dark in color.  Your bone or joint underneath the infected area becomes painful after the skin has healed.  Your infection returns in the same area or another area.  You notice a swollen bump in the infected area.  You develop new symptoms.  You have a fever. SEEK IMMEDIATE MEDICAL CARE IF:   You feel very sleepy.  You develop vomiting or diarrhea.  You have a general ill feeling (malaise) with muscle aches and pains. MAKE SURE YOU:   Understand these instructions.  Will watch your condition.  Will get help right away if you are not doing well or get worse. Document Released: 12/23/2004 Document Revised: 07/30/2013 Document Reviewed: 05/31/2011 [facility] Patient Information 2015 [place], [place].  This information is not intended to replace advice given to you by your health care provider. Make sure you discuss any questions you have with your health care provider. Orbital Cellulitis  Orbital cellulitis is an infection of the soft tissue of the orbit without abscess formation. The eye socket is the area around and behind the eye. It usually comes on suddenly in children, but can happen at any age. This condition can lead to death if untreated.  CAUSES   A germ (bacterial) infection of the area around and behind the eye.  Long-term (chronic) sinus infections.  An object (foreign body) stuck behind the eye.  An injury that goes through (penetrates) to tissues of the eyelids.  Trauma with secondary infection.  Fracture of the boney wall or floor of the orbit.  Serious eyelid infections.  Bite wounds.  Inflammation or infection of the lining membranes of the brain (meningitis).  Blood poisoning or infection (septicemia).  Dental area filled with pus (abscesses).  Severe uncontrolled diabetes (ketoacidosis). SYMPTOMS   Pain in the eye.  Redness and puffiness (swelling) of the eyelids. The swelling is often bad enough that the eye has to close.  Fever and feeling generally ill.  The lids and the cheek may be very red, hot and swollen.  A drop in vision.  Pain when touching the area around the eye.  The eye itself may look like it is "popping out" (proptosis).  Double vision - seeing two of everything (diplopia). DIAGNOSIS  An ophthalmologist can tell you if you  have orbital cellulitis during an eye exam. It is important to know if the infection goes into the area behind the eye. True orbital cellulitis is a medical emergency. A CT scan may be needed to see if the sinuses are involved. A CT scan can also see if an abscess has formed behind the eye. TREATMENT  Orbital cellulitis should be treated in the hospital with medicine that kills germs (antibiotics). These  antibiotics are given right into the bloodstream through a vein (intravenous). SEEK IMMEDIATE MEDICAL CARE IF:   You have red, swollen eyelids.  You have a fever.  You develop double vision. MAKE SURE YOU:   Understand these instructions.  Will watch your condition.  Will get help right away if you are not doing well or get worse. Document Released: 03/09/2001 Document Revised: 06/07/2011 Document Reviewed: 07/10/2007 [facility] Patient Information 2015 [place], [place]. This information is not intended to replace advice given to you by your health care provider. Make sure you discuss any questions you have with your health care provider. Spider Bite Spider bites are not common. Most spider bites do not cause serious problems. The elderly, very young children, and people with certain existing medical conditions are more likely to experience significant symptoms. SYMPTOMS  Spider bites may not cause any pain at first. Within 1 or 2 days of the bite, there may be swelling, redness, and pain in the bite area. However, some spider bites can cause pain within the first hour. TREATMENT  Your caregiver may prescribe antibiotic medicine if a bacterial infection develops in the bite. However, not all spider bites require antibiotics or prescription medicines.  HOME CARE INSTRUCTIONS  Do not scratch the bite area.  Keep the bite area clean and dry. Wash the area with soap and water as directed.  Put ice or cool compresses on the bite area.  Put ice in a plastic bag.  Place a towel between your skin and the bag.  Leave the ice on for 20 minutes, 4 times a day for the first 2 to 3 days, or as directed.  Keep the bite area elevated above the level of your heart. This helps reduce redness and swelling.  Only take over-the-counter or prescription medicines as directed by your caregiver.  If you are given antibiotics, take them as directed. Finish them even if you start to feel better. You may  need a tetanus shot if:  You cannot remember when you had your last tetanus shot.  You have never had a tetanus shot.  The injury broke your skin. If you get a tetanus shot, your arm may swell, get red, and feel warm to the touch. This is common and not a problem. If you need a tetanus shot and you choose not to have one, there is a rare chance of getting tetanus. Sickness from tetanus can be serious. SEEK MEDICAL CARE IF: Your bite is not better after 3 days of treatment. SEEK IMMEDIATE MEDICAL CARE IF:  Your bite turns purple or develops increased swelling, pain, or redness.  You develop shortness of breath or chest pain.  You have muscle cramps or painful muscle spasms.  You develop abdominal pain, nausea, or vomiting.  You feel unusually tired or sleepy. MAKE SURE YOU:  Understand these instructions.  Will watch your condition.  Will get help right away if you are not doing well or get worse. Document Released: 04/22/2004 Document Revised: 06/07/2011 Document Reviewed: 10/14/2010 [facility] Patient Information 2015 [place], [place]. This information is  not intended to replace advice given to you by your health care provider. Make sure you discuss any questions you have with your health care provider.

## 2013-12-31 NOTE — Progress Notes (Addendum)
Subjective:  This chart was scribed for Delman Cheadle, MD, Starleen Arms, Medical Scribe. This patient was seen in room Rm 3 and the patient's care was started at 6:18 PM.   Patient ID: Misty [name], female    DOB: 08/25/1940, 73 y.o.   MRN: [id number] Chief Complaint  Patient presents with  . Insect Bite    face, x 3 days, swelling    HPI HPI Comments: Misty [name] is a 73 y.o. female who presents to [facility] complaining of an spider bite on her face with associated gradually worsening area of swelling and redness on her forehead.  Patient reports that she has observed pus drainage 3 days ago from the area after squeezing the affected area yesterday.  Patient reports the area progressively swelled and became painful beginning today.  She reports associated fever and diaphoresis.  Patient reports use of peroxide, alcohol, heat/ice, sinus medication with the most relief noted from the heat/ice.  Patient denies vision changes, pain with eye movement, blurred/double vision, eye floaters.  NKA.    Past Medical History  Diagnosis Date  . Acute bronchitis 07/06/2008  . ALLERGIC RHINITIS 02/12/2008  . ANEMIA-IRON DEFICIENCY 02/12/2008  . DIABETES MELLITUS, TYPE II 02/12/2008  . FATIGUE 02/12/2008  . HYPERLIPIDEMIA 02/12/2008  . HYPERTENSION 11/05/2006  . KNEE PAIN, LEFT, ACUTE 05/11/2010  . LOW BACK PAIN 02/12/2008  . OSTEOPENIA 02/12/2008  . PEPTIC ULCER DISEASE 02/12/2008  . SINUSITIS- ACUTE-NOS 10/08/2008   Current Outpatient Prescriptions on File Prior to Visit  Medication Sig Dispense Refill  . aspirin 81 MG tablet Take 81 mg by mouth daily.        . valsartan-hydrochlorothiazide (DIOVAN-HCT) 320-25 MG per tablet TAKE 1 TABLET BY MOUTH DAILY.  90 tablet  2  . tiZANidine (ZANAFLEX) 4 MG tablet Take 1 tablet (4 mg total) by mouth every 6 (six) hours as needed for muscle spasms.  40 tablet  2   No current facility-administered medications on file prior to visit.   Allergies  Allergen  Reactions  . Lipitor [Atorvastatin Calcium] Nausea Only  . Lovastatin Nausea Only  . Simvastatin     REACTION: gi upset  . Tramadol Other (See Comments)    sleepiness     Review of Systems  Constitutional: Positive for fever and diaphoresis. Negative for chills, activity change and appetite change.  HENT: Positive for facial swelling.   Eyes: Negative for photophobia, pain, discharge, redness, itching and visual disturbance.  Musculoskeletal: Positive for myalgias, neck pain and neck stiffness.  Skin: Positive for wound.  Neurological: Positive for facial asymmetry and headaches. Negative for numbness.  Hematological: Negative for adenopathy.       Objective:  BP 122/86  Pulse 84  Temp(Src) 98.5 F (36.9 C)  Resp 16  Ht 5\' 2"  (1.575 m)  Wt 160 lb (72.576 kg)  BMI 29.26 kg/m2  SpO2 97%  Physical Exam  Nursing note and vitals reviewed. Constitutional: She is oriented to person, place, and time. She appears well-developed and well-nourished. No distress.  HENT:  Head: Normocephalic and atraumatic.  EOM intact.  conjunctiva normal no injection or chemosis.  Upper lid moderately swollen.  Fundoscopic exam benign.   Eyes: Conjunctivae and EOM are normal.  Neck: Neck supple. No tracheal deviation present.  Cardiovascular: Normal rate.   Pulmonary/Chest: Effort normal. No respiratory distress.  Musculoskeletal: Normal range of motion.  Neurological: She is alert and oriented to person, place, and time.  Skin: Skin is warm and dry. There  is erythema.  Mild erythema, no warmth but positive tenderness.  No focal fluctuance or fluid collection palpable.  Plaque-like induration approximately 5 cm diameter with 1.5 by 0.5 cm ulcerated eschar in center with areas of erythema extending over left upper eyelid, left medial epicanthus of lower eyelid and extending along upper orbital ridge laterally to temple and proximally into scalp.  Psychiatric: She has a normal mood and affect. Her  behavior is normal.   Results for orders placed in visit on 12/31/13  POCT CBC      Result Value Ref Range   WBC 9.2  4.6 - 10.2 K/uL   Lymph, poc 2.8  0.6 - 3.4   POC LYMPH PERCENT 30.5  10 - 50 %L   MID (cbc) 1.0 (*) 0 - 0.9   POC MID % 11.1  0 - 12 %M   POC Granulocyte 5.4  2 - 6.9   Granulocyte percent 58.4  37 - 80 %G   RBC 4.83  4.04 - 5.48 M/uL   Hemoglobin 12.6  12.2 - 16.2 g/dL   HCT, POC 40.9  37.7 - 47.9 %   MCV 84.6  80 - 97 fL   MCH, POC 26.1 (*) 27 - 31.2 pg   MCHC 30.8 (*) 31.8 - 35.4 g/dL   RDW, POC 16.4     Platelet Count, POC 334  142 - 424 K/uL   MPV 7.2  0 - 99.8 fL  POCT SEDIMENTATION RATE      Result Value Ref Range   POCT SED RATE 42 (*) 0 - 22 mm/hr        Assessment & Plan:  6:25 PM Advised patient a head CT will be ordered.  Will reassess after imaging and determine patient's course.  If normal CT, advised patient to follow-up tomorrow.    7:04 PM Advised patient that her labs are normal and she will be prescribed Clindamycin.  Increase intake of yogurt and begin probiotic.  Avoid sick contacts.   9:28 PM  Erysipelas - Plan: POCT CBC, POCT SEDIMENTATION RATE, CT Maxillofacial W/Cm, BUN+Creat, CANCELED: CT Orbits W/CM  Orbital cellulitis, left - Plan: POCT CBC, POCT SEDIMENTATION RATE, CT Maxillofacial W/Cm, BUN+Creat, CANCELED: CT Orbits W/CM - pt has PRESEPTAL cellutis.  Start clinda. To ER if worsening or any eye/vision sxs occur. Recheck in clinic tomorrow.  Meds ordered this encounter  Medications  . DISCONTD: clindamycin (CLEOCIN) 300 MG capsule    Sig: Take 1 capsule (300 mg total) by mouth 3 (three) times daily.    Dispense:  42 capsule    Refill:  0  . clindamycin (CLEOCIN) 300 MG capsule    Sig: Take 1 capsule (300 mg total) by mouth 3 (three) times daily.    Dispense:  42 capsule    Refill:  0    I personally performed the services described in this documentation, which was scribed in my presence. The recorded information has been  reviewed and considered, and addended by me as needed.  Delman Cheadle, MD MPH   EXAM:  CT MAXILLOFACIAL WITH CONTRAST  TECHNIQUE:  Multidetector CT imaging of the maxillofacial structures was  performed with intravenous contrast. Multiplanar CT image  reconstructions were also generated. A small metallic BB was placed  on the right temple in order to reliably differentiate right from  left.  CONTRAST: 64mL OMNIPAQUE IOHEXOL 300 MG/ML SOLN  COMPARISON: None.  FINDINGS:  There is no evidence of fracture or dislocation. The maxilla and  mandible  appear intact. The nasal bone is unremarkable in  appearance. There is partial absence of multiple maxillary and  mandibular teeth, with associated periapical abscesses.  The orbits are intact bilaterally. The visualized paranasal sinuses  and mastoid air cells are well-aerated.  Soft tissue swelling is noted about the left orbit and extending  over the left frontal calvarium, with associated skin thickening.  This raises concern for cellulitis. No underlying abscess is seen.  There is no evidence of vascular compromise.  The parapharyngeal fat planes are preserved. The nasopharynx,  oropharynx and hypopharynx are unremarkable in appearance. The  visualized portions of the valleculae and piriform sinuses are  grossly unremarkable.  The parotid and submandibular glands are within normal limits. No  cervical lymphadenopathy is seen. The visualized portions of the  brain are unremarkable in appearance. No abnormal focal contrast  enhancement is seen.  IMPRESSION:  1. Soft tissue swelling about the left orbit and overlying the left  frontal calvarium, with associated skin thickening. This raises  concern for cellulitis. No evidence of abscess.  2. The orbits remain intact bilaterally.  3. Partial absence of multiple maxillary and mandibular teeth, with  associated periapical abscesses.

## 2014-01-01 ENCOUNTER — Ambulatory Visit (INDEPENDENT_AMBULATORY_CARE_PROVIDER_SITE_OTHER): Payer: Medicare Other | Admitting: Family Medicine

## 2014-01-01 ENCOUNTER — Inpatient Hospital Stay (HOSPITAL_COMMUNITY)
Admission: EM | Admit: 2014-01-01 | Discharge: 2014-01-05 | DRG: 603 | Disposition: A | Discharge status: Home / Self Care | Payer: Medicare Other | Attending: Internal Medicine | Admitting: Internal Medicine

## 2014-01-01 ENCOUNTER — Encounter (HOSPITAL_COMMUNITY): Payer: Self-pay | Admitting: Emergency Medicine

## 2014-01-01 VITALS — BP 138/60 | HR 93 | Resp 18

## 2014-01-01 DIAGNOSIS — Z888 Allergy status to other drugs, medicaments and biological substances status: Secondary | ICD-10-CM | POA: Diagnosis not present

## 2014-01-01 DIAGNOSIS — B029 Zoster without complications: Secondary | ICD-10-CM | POA: Diagnosis present

## 2014-01-01 DIAGNOSIS — J309 Allergic rhinitis, unspecified: Secondary | ICD-10-CM | POA: Diagnosis present

## 2014-01-01 DIAGNOSIS — Z90710 Acquired absence of both cervix and uterus: Secondary | ICD-10-CM | POA: Diagnosis not present

## 2014-01-01 DIAGNOSIS — Z8249 Family history of ischemic heart disease and other diseases of the circulatory system: Secondary | ICD-10-CM | POA: Diagnosis not present

## 2014-01-01 DIAGNOSIS — H00036 Abscess of eyelid left eye, unspecified eyelid: Secondary | ICD-10-CM | POA: Diagnosis not present

## 2014-01-01 DIAGNOSIS — Z885 Allergy status to narcotic agent status: Secondary | ICD-10-CM

## 2014-01-01 DIAGNOSIS — L03211 Cellulitis of face: Secondary | ICD-10-CM | POA: Diagnosis not present

## 2014-01-01 DIAGNOSIS — M8580 Other specified disorders of bone density and structure, unspecified site: Secondary | ICD-10-CM | POA: Diagnosis present

## 2014-01-01 DIAGNOSIS — R7301 Impaired fasting glucose: Secondary | ICD-10-CM

## 2014-01-01 DIAGNOSIS — N179 Acute kidney failure, unspecified: Secondary | ICD-10-CM

## 2014-01-01 DIAGNOSIS — M542 Cervicalgia: Secondary | ICD-10-CM | POA: Diagnosis present

## 2014-01-01 DIAGNOSIS — D509 Iron deficiency anemia, unspecified: Secondary | ICD-10-CM | POA: Diagnosis present

## 2014-01-01 DIAGNOSIS — Z79899 Other long term (current) drug therapy: Secondary | ICD-10-CM | POA: Diagnosis not present

## 2014-01-01 DIAGNOSIS — Z7982 Long term (current) use of aspirin: Secondary | ICD-10-CM | POA: Diagnosis not present

## 2014-01-01 DIAGNOSIS — R197 Diarrhea, unspecified: Secondary | ICD-10-CM

## 2014-01-01 DIAGNOSIS — Z87891 Personal history of nicotine dependence: Secondary | ICD-10-CM | POA: Diagnosis not present

## 2014-01-01 DIAGNOSIS — E876 Hypokalemia: Secondary | ICD-10-CM | POA: Diagnosis not present

## 2014-01-01 DIAGNOSIS — Z8711 Personal history of peptic ulcer disease: Secondary | ICD-10-CM

## 2014-01-01 DIAGNOSIS — E785 Hyperlipidemia, unspecified: Secondary | ICD-10-CM | POA: Diagnosis present

## 2014-01-01 DIAGNOSIS — M5416 Radiculopathy, lumbar region: Secondary | ICD-10-CM | POA: Diagnosis present

## 2014-01-01 DIAGNOSIS — E119 Type 2 diabetes mellitus without complications: Secondary | ICD-10-CM | POA: Diagnosis present

## 2014-01-01 DIAGNOSIS — I10 Essential (primary) hypertension: Secondary | ICD-10-CM

## 2014-01-01 HISTORY — DX: Zoster without complications: B02.9

## 2014-01-01 HISTORY — DX: Cellulitis of face: L03.211

## 2014-01-01 LAB — BASIC METABOLIC PANEL
ANION GAP: 16 — AB (ref 5–15)
BUN: 17 mg/dL (ref 6–23)
CO2: 24 meq/L (ref 19–32)
CREATININE: 1.17 mg/dL — AB (ref 0.50–1.10)
Calcium: 9.5 mg/dL (ref 8.4–10.5)
Chloride: 97 mEq/L (ref 96–112)
GFR calc Af Amer: 52 mL/min — ABNORMAL LOW (ref >90)
GFR calc non Af Amer: 45 mL/min — ABNORMAL LOW (ref >90)
Glucose, Bld: 109 mg/dL — ABNORMAL HIGH (ref 70–99)
Potassium: 3.5 mEq/L — ABNORMAL LOW (ref 3.7–5.3)
Sodium: 137 mEq/L (ref 137–147)

## 2014-01-01 LAB — CBC WITH DIFFERENTIAL/PLATELET
BASOS ABS: 0.1 10*3/uL (ref 0.0–0.1)
Basophils Relative: 1 % (ref 0–1)
EOS ABS: 0.1 10*3/uL (ref 0.0–0.7)
EOS PCT: 1 % (ref 0–5)
HCT: 37.3 % (ref 36.0–46.0)
Hemoglobin: 12.4 g/dL (ref 12.0–15.0)
LYMPHS ABS: 3 10*3/uL (ref 0.7–4.0)
Lymphocytes Relative: 35 % (ref 12–46)
MCH: 27.1 pg (ref 26.0–34.0)
MCHC: 33.2 g/dL (ref 30.0–36.0)
MCV: 81.6 fL (ref 78.0–100.0)
Monocytes Absolute: 1.2 10*3/uL — ABNORMAL HIGH (ref 0.1–1.0)
Monocytes Relative: 14 % — ABNORMAL HIGH (ref 3–12)
Neutro Abs: 4.3 10*3/uL (ref 1.7–7.7)
Neutrophils Relative %: 50 % (ref 43–77)
Platelets: 315 10*3/uL (ref 150–400)
RBC: 4.57 MIL/uL (ref 3.87–5.11)
RDW: 14.9 % (ref 11.5–15.5)
WBC: 8.6 10*3/uL (ref 4.0–10.5)

## 2014-01-01 LAB — I-STAT CG4 LACTIC ACID, ED: Lactic Acid, Venous: 1.3 mmol/L (ref 0.5–2.2)

## 2014-01-01 LAB — GLUCOSE, CAPILLARY: GLUCOSE-CAPILLARY: 149 mg/dL — AB (ref 70–99)

## 2014-01-01 MED ORDER — ONDANSETRON HCL 4 MG PO TABS: 4.0000 mg | ORAL_TABLET | Freq: Four times a day (QID) | ORAL | Status: DC | PRN

## 2014-01-01 MED ORDER — ONDANSETRON HCL 4 MG/2ML IJ SOLN
4.0000 mg | Freq: Four times a day (QID) | INTRAMUSCULAR | Status: DC | PRN
  Filled 2014-01-01: qty 2

## 2014-01-01 MED ORDER — ACETAMINOPHEN 650 MG RE SUPP: 650.0000 mg | Freq: Four times a day (QID) | RECTAL | Status: DC | PRN

## 2014-01-01 MED ORDER — INSULIN ASPART 100 UNIT/ML ~~LOC~~ SOLN
0.0000 [IU] | Freq: Three times a day (TID) | SUBCUTANEOUS | Status: DC
  Administered 2014-01-02 – 2014-01-03 (×3): 1 [IU] via SUBCUTANEOUS
  Administered 2014-01-05: 2 [IU] via SUBCUTANEOUS

## 2014-01-01 MED ORDER — ACETAMINOPHEN 325 MG PO TABS
650.0000 mg | ORAL_TABLET | Freq: Four times a day (QID) | ORAL | Status: DC | PRN
  Administered 2014-01-04 – 2014-01-05 (×3): 650 mg via ORAL
  Filled 2014-01-01 (×3): qty 2

## 2014-01-01 MED ORDER — IBUPROFEN 800 MG PO TABS: 800.0000 mg | ORAL_TABLET | Freq: Once | ORAL | Status: DC

## 2014-01-01 MED ORDER — IRBESARTAN 300 MG PO TABS
300.0000 mg | ORAL_TABLET | Freq: Every day | ORAL | Status: DC
Start: 2014-01-02 — End: 2014-01-02
  Filled 2014-01-01: qty 1

## 2014-01-01 MED ORDER — PIPERACILLIN-TAZOBACTAM 3.375 G IVPB
3.3750 g | Freq: Three times a day (TID) | INTRAVENOUS | Status: DC
  Administered 2014-01-02 – 2014-01-05 (×12): 3.375 g via INTRAVENOUS
  Filled 2014-01-01 (×13): qty 50

## 2014-01-01 MED ORDER — VANCOMYCIN HCL IN DEXTROSE 1-5 GM/200ML-% IV SOLN
1000.0000 mg | Freq: Once | INTRAVENOUS | Status: AC
  Administered 2014-01-02: 1000 mg via INTRAVENOUS
  Filled 2014-01-01: qty 200

## 2014-01-01 MED ORDER — IBUPROFEN 200 MG PO TABS
600.0000 mg | ORAL_TABLET | Freq: Once | ORAL | Status: AC
Start: 2014-01-01 — End: 2014-01-01
  Administered 2014-01-01: 600 mg via ORAL
  Filled 2014-01-01: qty 3

## 2014-01-01 MED ORDER — CLINDAMYCIN PHOSPHATE 600 MG/50ML IV SOLN
600.0000 mg | Freq: Once | INTRAVENOUS | Status: AC
  Administered 2014-01-01: 600 mg via INTRAVENOUS
  Filled 2014-01-01: qty 50

## 2014-01-01 MED ORDER — VALACYCLOVIR HCL 500 MG PO TABS
1000.0000 mg | ORAL_TABLET | Freq: Three times a day (TID) | ORAL | Status: DC
  Administered 2014-01-02 – 2014-01-05 (×12): 1000 mg via ORAL
  Filled 2014-01-01 (×13): qty 2

## 2014-01-01 MED ORDER — HYDRALAZINE HCL 20 MG/ML IJ SOLN
10.0000 mg | INTRAMUSCULAR | Status: DC | PRN
Start: 2014-01-01 — End: 2014-01-05

## 2014-01-01 MED ORDER — TIZANIDINE HCL 4 MG PO TABS
4.0000 mg | ORAL_TABLET | Freq: Four times a day (QID) | ORAL | Status: DC | PRN
  Filled 2014-01-01: qty 1

## 2014-01-01 MED ORDER — OXYCODONE HCL 5 MG PO TABS
5.0000 mg | ORAL_TABLET | ORAL | Status: DC | PRN
  Administered 2014-01-02 – 2014-01-04 (×4): 5 mg via ORAL
  Filled 2014-01-01 (×5): qty 1

## 2014-01-01 MED ORDER — ENOXAPARIN SODIUM 40 MG/0.4ML ~~LOC~~ SOLN
40.0000 mg | Freq: Every day | SUBCUTANEOUS | Status: DC
  Administered 2014-01-02 – 2014-01-04 (×4): 40 mg via SUBCUTANEOUS
  Filled 2014-01-01 (×5): qty 0.4

## 2014-01-01 MED ORDER — SODIUM CHLORIDE 0.9 % IV SOLN
INTRAVENOUS | Status: AC
  Administered 2014-01-02 (×2): via INTRAVENOUS

## 2014-01-01 MED ORDER — MORPHINE SULFATE 2 MG/ML IJ SOLN: 1.0000 mg | INTRAMUSCULAR | Status: DC | PRN

## 2014-01-01 NOTE — ED Notes (Signed)
Pt reports being seen here for same last night, for possible insect bite to face.  Pt reports she went back to UC today, and was instructed to come to the ED for IV abx for cellulitis.  Pt was put on PO abx without relief.

## 2014-01-01 NOTE — Patient Instructions (Signed)
GO TO [name] AND CHECK IN.  TELL THEM YOUR INFECTION IS GETTING WORSE ON ORAL ANTIBIOTICS SO YOUR DOCTOR SENT YOU TO THE [name] TO BE HOSPITALIZED FOR IV ANTIBIOTICS.  WE ARE ON THE SAME COMPUTER SYSTEM SO THEY WILL BE ABLE TO SEE MY NOTE. IF I HEAR BACK FROM THE HOSPITAL DOCTORS, I WILL CALL YOU AND TELL YOU WHAT TO DO BUT UNLESS YOU HEAR OTHERWISE, GO TO THE [name] ASAP.

## 2014-01-01 NOTE — H&P (Signed)
[facility] History and Physical  Misty [name] AVW:[id number] DOB: [birth year] DOA: 01/01/2014  Referring physician: ER physician. PCP: Cathlean Cower, MD   Chief Complaint: Left facial and periorbital pain and swelling.  HPI: Misty [name] is a 73 y.o. female history of hypertension and possible diabetes mellitus presents to the ER with worsening pain and swelling in the left periorbital area and maxillary area. Patient noticed some blisters on Wednesday last one week ago on her forehead left side which eventually ruptured. Patient also had a couple of other blisters around the left eyebrow area. Over the last 2 days patient has been having swelling of the facial area and periorbital area which has been gradually worsening with erythema. Denies any trauma and patient does not recall any insect bites. Patient had gone to urgent care Center and was referred to the ER and had CT especially at which did not show any involvement of the orbits. Patient was discharged home on oral antibiotics. Despite taking which patient still having pain and worsening swelling and patient was referred back to the ER for IV antibiotics. On exam patient is able to move her left eye without any difficulty and no pain on moving the left eye. Patient has good vision in both eyes. Cornea is clearly visible and reacting to light. I have discussed with radiologist Dr. Radene Knee about the CAT scan findings. At this time patient will be admitted for IV antibiotics and vision is also concerning for herpes zoster. Today patient's daughter also noticed some redness around the right infraorbital area.   Review of Systems: As presented in the history of presenting illness, rest negative.  Past Medical History  Diagnosis Date  . Acute bronchitis 07/06/2008  . ALLERGIC RHINITIS 02/12/2008  . ANEMIA-IRON DEFICIENCY 02/12/2008  . DIABETES MELLITUS, TYPE II 02/12/2008  . FATIGUE 02/12/2008  . HYPERLIPIDEMIA 02/12/2008  . HYPERTENSION  11/05/2006  . KNEE PAIN, LEFT, ACUTE 05/11/2010  . LOW BACK PAIN 02/12/2008  . OSTEOPENIA 02/12/2008  . PEPTIC ULCER DISEASE 02/12/2008  . SINUSITIS- ACUTE-NOS 10/08/2008   Past Surgical History  Procedure Laterality Date  . Esophagogastroduodenoscopy  1970  . Appendectomy    . Abdominal hysterectomy     Social History:  reports that she has quit smoking. She has never used smokeless tobacco. She reports that she drinks alcohol. She reports that she does not use illicit drugs. Where does patient live home. Can patient participate in ADLs? Yes.  Allergies  Allergen Reactions  . Lipitor [Atorvastatin Calcium] Nausea Only  . Lovastatin Nausea Only  . Simvastatin Other (See Comments)    gi upset  . Tramadol Other (See Comments)    sleepiness    Family History:  Family History  Problem Relation Age of Onset  . Hypertension Brother   . Colon polyps Brother 49  . Coronary artery disease Brother   . Thyroid cancer Brother   . Depression Child     manic depression  . Throat cancer Other   . Mental illness Other       Prior to Admission medications   Medication Sig Start Date End Date Taking? Authorizing Provider  aspirin EC 81 MG tablet Take 81 mg by mouth daily with breakfast.   Yes Historical Provider, MD  clindamycin (CLEOCIN) 300 MG capsule Take 1 capsule (300 mg total) by mouth 3 (three) times daily. 12/31/13  Yes Shawnee Knapp, MD  tiZANidine (ZANAFLEX) 4 MG tablet Take 1 tablet (4 mg total) by mouth every 6 (six)  hours as needed for muscle spasms. 11/29/13  Yes Biagio Borg, MD  valsartan-hydrochlorothiazide (DIOVAN-HCT) 320-25 MG per tablet Take 1 tablet by mouth daily with breakfast.   Yes Historical Provider, MD    Physical Exam: Filed Vitals:   01/01/14 1757 01/01/14 2032  BP: 134/70 119/71  Pulse: 91 75  Temp: 98 F (36.7 C)   TempSrc: Oral   Resp: 18 20  SpO2: 100% 96%     General:  Well-developed and nourished.  Eyes: Left periorbital area is edematous and  erythematous. Patient's both cornea clearly visible and pupils are reacting to light. Patient has no pain on moving her eyes.  ENT: Left maxillary area is erythematous and there is mild erythema in the right infraorbital area.  Neck: No neck rigidity.  Cardiovascular: S1-S2 heard.  Respiratory: No rhonchi or crepitations.  Abdomen: Soft nontender bowel sounds present. No guarding rigidity.  Skin: Skin description as explained in the eyes and ENT.  Musculoskeletal: No edema.  Psychiatric: Appears normal.  Neurologic: A oriented to time place and person. Moves all extremities.  Labs on Admission:  Basic Metabolic Panel:  Recent Labs Lab 12/31/13 2043 01/01/14 1904  NA  --  137  K  --  3.5*  CL  --  97  CO2  --  24  GLUCOSE  --  109*  BUN  --  17  CREATININE 1.10 1.17*  CALCIUM  --  9.5   Liver Function Tests: No results found for this basename: AST, ALT, ALKPHOS, BILITOT, PROT, ALBUMIN,  in the last 168 hours No results found for this basename: LIPASE, AMYLASE,  in the last 168 hours No results found for this basename: AMMONIA,  in the last 168 hours CBC:  Recent Labs Lab 12/31/13 1834 01/01/14 1904  WBC 9.2 8.6  NEUTROABS  --  4.3  HGB 12.6 12.4  HCT 40.9 37.3  MCV 84.6 81.6  PLT  --  315   Cardiac Enzymes: No results found for this basename: CKTOTAL, CKMB, CKMBINDEX, TROPONINI,  in the last 168 hours  BNP (last 3 results) No results found for this basename: PROBNP,  in the last 8760 hours CBG: No results found for this basename: GLUCAP,  in the last 168 hours  Radiological Exams on Admission: Ct Maxillofacial W/cm  12/31/2013   CLINICAL DATA:  Acute onset of swelling above the left eyebrow. Initial encounter.  EXAM: CT MAXILLOFACIAL WITH CONTRAST  TECHNIQUE: Multidetector CT imaging of the maxillofacial structures was performed with intravenous contrast. Multiplanar CT image reconstructions were also generated. A small metallic BB was placed on the right  temple in order to reliably differentiate right from left.  CONTRAST:  64mL OMNIPAQUE IOHEXOL 300 MG/ML  SOLN  COMPARISON:  None.  FINDINGS: There is no evidence of fracture or dislocation. The maxilla and mandible appear intact. The nasal bone is unremarkable in appearance. There is partial absence of multiple maxillary and mandibular teeth, with associated periapical abscesses.  The orbits are intact bilaterally. The visualized paranasal sinuses and mastoid air cells are well-aerated.  Soft tissue swelling is noted about the left orbit and extending over the left frontal calvarium, with associated skin thickening. This raises concern for cellulitis. No underlying abscess is seen. There is no evidence of vascular compromise.  The parapharyngeal fat planes are preserved. The nasopharynx, oropharynx and hypopharynx are unremarkable in appearance. The visualized portions of the valleculae and piriform sinuses are grossly unremarkable.  The parotid and submandibular glands are within normal limits.  No cervical lymphadenopathy is seen. The visualized portions of the brain are unremarkable in appearance. No abnormal focal contrast enhancement is seen.  IMPRESSION: 1. Soft tissue swelling about the left orbit and overlying the left frontal calvarium, with associated skin thickening. This raises concern for cellulitis. No evidence of abscess. 2. The orbits remain intact bilaterally. 3. Partial absence of multiple maxillary and mandibular teeth, with associated periapical abscesses.   Electronically Signed   By: Garald Balding M.D.   On: 12/31/2013 21:16     Assessment/Plan Principal Problem:   Facial cellulitis Active Problems:   Essential hypertension   1. Left facial cellulitis with periorbital involvement - patient is able to move her eyes without any pain. There is no tearing of the eyes. Pupils are equal and reacting to light and cornea is clearly visible. At this time I have placed patient on maximized and  Zosyn and since there is blistering reaction to the skin on the left side of the forehead I have also place patient empirically on Valtrex for possible herpes zoster with airborne precautions. Pain relief medications. Many need ophthalmology input. 2. Possible diabetes mellitus2 - patient's hemoglobin A1c in March of this year was 6.6. Patient is not on any antidiabetic medications presently. For now I have placed patient on sliding-scale coverage and we will recheck hemoglobin A1c. 3. Hypertension - continue ARB and at this time we will hold off HCTZ and gently hydrate.    Code Status: Full code.  Family Communication: Patient's daughters at the bedside.  Disposition Plan: Admit to inpatient.    Dravin Lance N. [facility] Pager [phone].  If 7PM-7AM, please contact night-coverage [email or web address] Password [email or web address] 01/01/2014, 10:57 PM

## 2014-01-01 NOTE — ED Provider Notes (Addendum)
CSN: [id number]     Arrival date & time 01/01/14  1734 History   First MD Initiated Contact with Patient 01/01/14 1818     Chief Complaint  Patient presents with  . Facial Pain  . Facial Swelling     (Consider location/radiation/quality/duration/timing/severity/associated sxs/prior Treatment) HPI 73 year old female with a left for head and periorbital swelling and redness over last couple days. One week ago she had a blister on her for head that she's not sure it came from. There was concern at carbonate poison IV/oak. She does not think she was bitten. A few days ago she scratched it and it drained. Since then she's noticed redness and swelling spreading over her face. Has felt subjective fevers and chills. Went to urgent care yesterday and was given oral antibiotics the air and then by prescription. She's taken 2 of her clindamycin today. Patient went back to the urgent care today and was told to go to the [place] [facility] for admission for IV antibiotics. She had a CT of her face yesterday. Denies blurry vision. No painful extraocular movements. Her left eye is becoming more swollen and today woke up completely swollen and matted.  Past Medical History  Diagnosis Date  . Acute bronchitis 07/06/2008  . ALLERGIC RHINITIS 02/12/2008  . ANEMIA-IRON DEFICIENCY 02/12/2008  . DIABETES MELLITUS, TYPE II 02/12/2008  . FATIGUE 02/12/2008  . HYPERLIPIDEMIA 02/12/2008  . HYPERTENSION 11/05/2006  . KNEE PAIN, LEFT, ACUTE 05/11/2010  . LOW BACK PAIN 02/12/2008  . OSTEOPENIA 02/12/2008  . PEPTIC ULCER DISEASE 02/12/2008  . SINUSITIS- ACUTE-NOS 10/08/2008   Past Surgical History  Procedure Laterality Date  . Esophagogastroduodenoscopy  1970  . Appendectomy    . Abdominal hysterectomy     Family History  Problem Relation Age of Onset  . Hypertension Brother   . Colon polyps Brother 60  . Coronary artery disease Brother   . Thyroid cancer Brother   . Depression Child     manic depression  . Throat  cancer Other   . Mental illness Other    History  Substance Use Topics  . Smoking status: Former Research scientist (life sciences)  . Smokeless tobacco: Not on file  . Alcohol Use: Yes     Comment: rare   OB History   Grav Para Term Preterm Abortions TAB SAB Ect Mult Living                 Review of Systems  Constitutional: Positive for fever and chills.  HENT: Positive for facial swelling. Negative for trouble swallowing and voice change.   Eyes: Positive for discharge.  Respiratory: Negative for shortness of breath.   All other systems reviewed and are negative.     Allergies  Lipitor; Lovastatin; Simvastatin; and Tramadol  Home Medications   Prior to Admission medications   Medication Sig Start Date End Date Taking? Authorizing Provider  aspirin 81 MG tablet Take 81 mg by mouth daily.      Historical Provider, MD  clindamycin (CLEOCIN) 300 MG capsule Take 1 capsule (300 mg total) by mouth 3 (three) times daily. 12/31/13   Shawnee Knapp, MD  tiZANidine (ZANAFLEX) 4 MG tablet Take 1 tablet (4 mg total) by mouth every 6 (six) hours as needed for muscle spasms. 11/29/13   Biagio Borg, MD  valsartan-hydrochlorothiazide (DIOVAN-HCT) 320-25 MG per tablet TAKE 1 TABLET BY MOUTH DAILY. 03/29/13   Biagio Borg, MD   BP 134/70  Pulse 91  Temp(Src) 98 F (36.7 C) (Oral)  Resp 18  SpO2 100% Physical Exam  Nursing note and vitals reviewed. Constitutional: She is oriented to person, place, and time. She appears well-developed and well-nourished.  HENT:  Head: Normocephalic and atraumatic.    Right Ear: External ear normal.  Left Ear: External ear normal.  Nose: Nose normal.  Eyes: Right eye exhibits no discharge. Left eye exhibits no discharge.  Cardiovascular: Normal rate, regular rhythm and normal heart sounds.   Pulmonary/Chest: Effort normal and breath sounds normal.  Abdominal: She exhibits no distension.  Neurological: She is alert and oriented to person, place, and time.  Skin: Skin is warm and  dry.    ED Course  Procedures (including critical care time) Labs Review Labs Reviewed  CBC WITH DIFFERENTIAL - Abnormal; Notable for the following:    Monocytes Relative 14 (*)    Monocytes Absolute 1.2 (*)    All other components within normal limits  BASIC METABOLIC PANEL - Abnormal; Notable for the following:    Potassium 3.5 (*)    Glucose, Bld 109 (*)    Creatinine, Ser 1.17 (*)    GFR calc non Af Amer 45 (*)    GFR calc Af Amer 52 (*)    Anion gap 16 (*)    All other components within normal limits  GLUCOSE, CAPILLARY - Abnormal; Notable for the following:    Glucose-Capillary 149 (*)    All other components within normal limits  COMPREHENSIVE METABOLIC PANEL  CBC WITH DIFFERENTIAL  HEMOGLOBIN A1C  I-STAT CG4 LACTIC ACID, ED    Imaging Review Ct Maxillofacial W/cm  12/31/2013   CLINICAL DATA:  Acute onset of swelling above the left eyebrow. Initial encounter.  EXAM: CT MAXILLOFACIAL WITH CONTRAST  TECHNIQUE: Multidetector CT imaging of the maxillofacial structures was performed with intravenous contrast. Multiplanar CT image reconstructions were also generated. A small metallic BB was placed on the right temple in order to reliably differentiate right from left.  CONTRAST:  51mL OMNIPAQUE IOHEXOL 300 MG/ML  SOLN  COMPARISON:  None.  FINDINGS: There is no evidence of fracture or dislocation. The maxilla and mandible appear intact. The nasal bone is unremarkable in appearance. There is partial absence of multiple maxillary and mandibular teeth, with associated periapical abscesses.  The orbits are intact bilaterally. The visualized paranasal sinuses and mastoid air cells are well-aerated.  Soft tissue swelling is noted about the left orbit and extending over the left frontal calvarium, with associated skin thickening. This raises concern for cellulitis. No underlying abscess is seen. There is no evidence of vascular compromise.  The parapharyngeal fat planes are preserved. The  nasopharynx, oropharynx and hypopharynx are unremarkable in appearance. The visualized portions of the valleculae and piriform sinuses are grossly unremarkable.  The parotid and submandibular glands are within normal limits. No cervical lymphadenopathy is seen. The visualized portions of the brain are unremarkable in appearance. No abnormal focal contrast enhancement is seen.  IMPRESSION: 1. Soft tissue swelling about the left orbit and overlying the left frontal calvarium, with associated skin thickening. This raises concern for cellulitis. No evidence of abscess. 2. The orbits remain intact bilaterally. 3. Partial absence of multiple maxillary and mandibular teeth, with associated periapical abscesses.   Electronically Signed   By: Garald Balding M.D.   On: 12/31/2013 21:16     EKG Interpretation None      MDM   Final diagnoses:  Facial cellulitis  Essential hypertension    Patient with worsening facial cellulitis. She had a CT scan within  the last 24 hours that showed no orbital pathology. She has normal extra ocular movements without pain. At this point I do not feel she is repeat imaging. As her cellulitis is spreading despite being on oral antibiotics I feel she warrants IV antibiotics and admission to the hospital.    Ephraim Hamburger, MD 01/02/14 0045  Ephraim Hamburger, MD 01/17/14 [phone]

## 2014-01-02 ENCOUNTER — Ambulatory Visit: Payer: Medicare Other | Admitting: Family

## 2014-01-02 ENCOUNTER — Encounter (HOSPITAL_COMMUNITY): Payer: Self-pay | Admitting: Internal Medicine

## 2014-01-02 DIAGNOSIS — N179 Acute kidney failure, unspecified: Secondary | ICD-10-CM

## 2014-01-02 DIAGNOSIS — R7301 Impaired fasting glucose: Secondary | ICD-10-CM | POA: Diagnosis present

## 2014-01-02 DIAGNOSIS — E876 Hypokalemia: Secondary | ICD-10-CM

## 2014-01-02 DIAGNOSIS — B029 Zoster without complications: Secondary | ICD-10-CM | POA: Diagnosis present

## 2014-01-02 HISTORY — DX: Zoster without complications: B02.9

## 2014-01-02 LAB — COMPREHENSIVE METABOLIC PANEL
ALT: 15 U/L (ref 0–35)
ANION GAP: 13 (ref 5–15)
AST: 16 U/L (ref 0–37)
Albumin: 3.1 g/dL — ABNORMAL LOW (ref 3.5–5.2)
Alkaline Phosphatase: 91 U/L (ref 39–117)
BILIRUBIN TOTAL: 0.3 mg/dL (ref 0.3–1.2)
BUN: 19 mg/dL (ref 6–23)
CHLORIDE: 96 meq/L (ref 96–112)
CO2: 25 mEq/L (ref 19–32)
CREATININE: 1.27 mg/dL — AB (ref 0.50–1.10)
Calcium: 8.7 mg/dL (ref 8.4–10.5)
GFR calc Af Amer: 47 mL/min — ABNORMAL LOW (ref >90)
GFR calc non Af Amer: 41 mL/min — ABNORMAL LOW (ref >90)
Glucose, Bld: 117 mg/dL — ABNORMAL HIGH (ref 70–99)
Potassium: 3.2 mEq/L — ABNORMAL LOW (ref 3.7–5.3)
Sodium: 134 mEq/L — ABNORMAL LOW (ref 137–147)
Total Protein: 7 g/dL (ref 6.0–8.3)

## 2014-01-02 LAB — CBC WITH DIFFERENTIAL/PLATELET
BASOS ABS: 0.1 10*3/uL (ref 0.0–0.1)
Basophils Relative: 1 % (ref 0–1)
EOS ABS: 0.2 10*3/uL (ref 0.0–0.7)
Eosinophils Relative: 2 % (ref 0–5)
HCT: 32.8 % — ABNORMAL LOW (ref 36.0–46.0)
Hemoglobin: 10.8 g/dL — ABNORMAL LOW (ref 12.0–15.0)
LYMPHS ABS: 4 10*3/uL (ref 0.7–4.0)
Lymphocytes Relative: 44 % (ref 12–46)
MCH: 27 pg (ref 26.0–34.0)
MCHC: 32.9 g/dL (ref 30.0–36.0)
MCV: 82 fL (ref 78.0–100.0)
MONOS PCT: 13 % — AB (ref 3–12)
Monocytes Absolute: 1.2 10*3/uL — ABNORMAL HIGH (ref 0.1–1.0)
Neutro Abs: 3.7 10*3/uL (ref 1.7–7.7)
Neutrophils Relative %: 40 % — ABNORMAL LOW (ref 43–77)
PLATELETS: 277 10*3/uL (ref 150–400)
RBC: 4 MIL/uL (ref 3.87–5.11)
RDW: 14.7 % (ref 11.5–15.5)
WBC: 9.2 10*3/uL (ref 4.0–10.5)

## 2014-01-02 LAB — GLUCOSE, CAPILLARY
GLUCOSE-CAPILLARY: 144 mg/dL — AB (ref 70–99)
GLUCOSE-CAPILLARY: 107 mg/dL — AB (ref 70–99)
Glucose-Capillary: 122 mg/dL — ABNORMAL HIGH (ref 70–99)
Glucose-Capillary: 105 mg/dL — ABNORMAL HIGH (ref 70–99)

## 2014-01-02 LAB — HEMOGLOBIN A1C
Hgb A1c MFr Bld: 6.4 % — ABNORMAL HIGH (ref <5.7)
Mean Plasma Glucose: 137 mg/dL — ABNORMAL HIGH (ref <117)

## 2014-01-02 MED ORDER — SODIUM CHLORIDE 0.9 % IV BOLUS (SEPSIS)
500.0000 mL | Freq: Once | INTRAVENOUS | Status: AC
  Administered 2014-01-02: 500 mL via INTRAVENOUS

## 2014-01-02 MED ORDER — POTASSIUM CHLORIDE CRYS ER 20 MEQ PO TBCR
20.0000 meq | EXTENDED_RELEASE_TABLET | Freq: Three times a day (TID) | ORAL | Status: AC
  Administered 2014-01-02 (×3): 20 meq via ORAL
  Filled 2014-01-02 (×3): qty 1

## 2014-01-02 MED ORDER — VANCOMYCIN HCL IN DEXTROSE 1-5 GM/200ML-% IV SOLN
1000.0000 mg | INTRAVENOUS | Status: DC
  Filled 2014-01-02: qty 200

## 2014-01-02 MED ORDER — CETYLPYRIDINIUM CHLORIDE 0.05 % MT LIQD
7.0000 mL | Freq: Two times a day (BID) | OROMUCOSAL | Status: DC
  Administered 2014-01-02 – 2014-01-05 (×5): 7 mL via OROMUCOSAL

## 2014-01-02 MED ORDER — CHLORHEXIDINE GLUCONATE 0.12 % MT SOLN
15.0000 mL | Freq: Two times a day (BID) | OROMUCOSAL | Status: DC
  Administered 2014-01-02 – 2014-01-05 (×4): 15 mL via OROMUCOSAL
  Filled 2014-01-02 (×9): qty 15

## 2014-01-02 MED ORDER — VANCOMYCIN HCL IN DEXTROSE 750-5 MG/150ML-% IV SOLN
750.0000 mg | Freq: Two times a day (BID) | INTRAVENOUS | Status: DC
  Administered 2014-01-02 – 2014-01-05 (×6): 750 mg via INTRAVENOUS
  Filled 2014-01-02 (×7): qty 150

## 2014-01-02 MED ORDER — POLYETHYLENE GLYCOL 3350 17 G PO PACK
17.0000 g | PACK | Freq: Every day | ORAL | Status: DC
  Administered 2014-01-02 – 2014-01-03 (×2): 17 g via ORAL
  Filled 2014-01-02 (×4): qty 1

## 2014-01-02 MED ORDER — POTASSIUM CHLORIDE CRYS ER 20 MEQ PO TBCR: 20.0000 meq | EXTENDED_RELEASE_TABLET | Freq: Two times a day (BID) | ORAL | Status: DC

## 2014-01-02 NOTE — Consult Note (Signed)
Misty [name]                                                                               01/02/2014                                               Pediatric Ophthalmology Consultation                                         Consult requested by: Dr. Rockne Menghini  Reason for consultation:  Shingles over left V1 distribution  HPI: 73yo female who started having redness and swelling over the left forehead on 12/31/13. Admitted through the ED on 01/01/14 for zoster of the left forehead.  Pertinent Medical History:   Active Ambulatory Problems    Diagnosis Date Noted  . DIABETES MELLITUS, TYPE II 02/12/2008  . HYPERLIPIDEMIA 02/12/2008  . ANEMIA-IRON DEFICIENCY 02/12/2008  . Essential hypertension 11/05/2006  . ALLERGIC RHINITIS 02/12/2008  . PEPTIC ULCER DISEASE 02/12/2008  . LOW BACK PAIN 02/12/2008  . OSTEOPENIA 02/12/2008  . FATIGUE 02/12/2008  . KNEE PAIN, LEFT, ACUTE 05/11/2010  . Preventative health care 11/07/2010  . Arthralgia 11/13/2012  . Left lumbar radiculopathy 12/22/2012  . Whiplash injury to neck 12/24/2012  . Trapezoid ligament sprain 12/24/2012  . Neck pain on left side 12/24/2012   Resolved Ambulatory Problems    Diagnosis Date Noted  . SINUSITIS- ACUTE-NOS 10/08/2008  . Acute bronchitis 07/06/2008   Past Medical History  Diagnosis Date  . HYPERTENSION 11/05/2006  . Facial cellulitis 01/01/2014  . Shingles outbreak 01/02/2014     Pertinent Ophthalmic History: glasses  Current Eye Medications: none  Systemic medications on admission:   Medications Prior to Admission  Medication Sig Dispense Refill  . aspirin EC 81 MG tablet Take 81 mg by mouth daily with breakfast.      . clindamycin (CLEOCIN) 300 MG capsule Take 1 capsule (300 mg total) by mouth 3 (three) times daily.  42 capsule  0  . tiZANidine (ZANAFLEX) 4 MG tablet Take 1 tablet (4 mg total) by mouth every 6 (six) hours as needed for muscle spasms.  40 tablet  2  . valsartan-hydrochlorothiazide (DIOVAN-HCT)  320-25 MG per tablet Take 1 tablet by mouth daily with breakfast.           ROS: pain over left forehead, relatively low at this time; rash and vesicles L forehead; denies vision changes, floaters, blurred vision, flashes, or involvement of the tip of the nose.  Visual Fields: FTC OU      Pupils:  Equal, brisk, no APD     Near acuity:   cc    OD   CSM  J1      cc    OS   CSM  J1  TA:        Normal to palpation OU     Dilation:  both eyes        Medication used  [  x ] NS 2.5% [ x ]Tropicamide  [  ] Cyclogyl [  ] Cyclomydril   Not dilated     External:   OD:  Normal      OS:  Slight redness and swelling of eyelids; no crusting or vesicles  Anterior segment exam:  By penlight   Conjunctiva:  OD:  Quiet     OS:  Quiet    Cornea:    OD: Clear, no fluorescein stain      OS: Clear, no fluorescein stain     Anterior Chamber:   OD:  Deep/quiet     OS:  Deep/quiet    Iris:    OD:  Normal      OS:  Normal     Lens:    OD:  Clear        OS:  Clear         Optic disc:  OD:  Flat, sharp, pink, healthy     OS:  Flat, sharp, pink, healthy     Central retina--examined with indirect ophthalmoscope:  OD:  Macula and vessels normal; media clear     OS:  Macula and vessels normal; media clear     Peripheral retina--examined with indirect ophthalmoscope:  OD:  Normal to periphery     OS:  Normal to periphery  Impression:   73yo female with V1 distribution of zoster. No ocular involvement at this time.  Recommendations/Plan:  1. Erythromycin ointment to the eyelids TID and PRN itching/burning. 2. Followup with general ophthalmologist in [place] for reevaluation and monitoring. Pt to call immediately for eye pain, foreign body or other worsening symptoms.   Please contact our office with any questions or concerns at [phone].   Arita Severtson

## 2014-01-02 NOTE — Progress Notes (Signed)
Progress Note   Misty [name] DJM:[id number] DOB: 04-Feb-1941 DOA: 01/01/2014 PCP: Cathlean Cower, MD   Brief Narrative:   Misty [name] is an 73 y.o. female with a PMH of hypertension and diabetes who initially presented to the urgent care center 2014/01/03 with soft tissue swelling around the left eye. She was re-evaluated 01/01/14 and referred to the ER for inpatient evaluation of her worsening facial cellulitis. Upon evaluation in the ER, the patient was noted to have a vesicular rash to her forehead. CT scan of the maxillofacial area showed significant soft tissue swelling.  Assessment/Plan:   Principal Problem:   Facial cellulitis / shingles outbreak  Continue empiric Zosyn, vancomycin and Valtrex.  No obvious visual acuity loss, but would ask ophthalmologist to evaluate.  Message left for Dr. Posey Pronto.  Continue contact/airborne isolation.  Active Problems:   Acute kidney injury  Hold Avapro and continue to gently hydrate.    Hypokalemia  Supplementing.    Impaired fasting glucose  Hemoglobin A1c is 6.4%.  Continue carbohydrate modified diet.  Currently being managed with SSI. CBGs 122-149.    Essential hypertension  Hold Avapro secondary to acute kidney injury.    DVT Prophylaxis  Continue Lovenox.  Code Status: Full. Family Communication: No family currently at the bedside Disposition Plan: Home when stable.   IV Access:    Peripheral IV   Procedures and diagnostic studies:   Ct Maxillofacial W/cm 01-03-14: 1. Soft tissue swelling about the left orbit and overlying the left frontal calvarium, with associated skin thickening. This raises concern for cellulitis. No evidence of abscess. 2. The orbits remain intact bilaterally. 3. Partial absence of multiple maxillary and mandibular teeth, with associated periapical abscesses.    Medical Consultants:    Ophthalmology   Other Consultants:    None.   Anti-Infectives:    Vancomycin  01/02/14--->  Zosyn 01/01/14--->  Valtrex 01/01/14--->  Clindamycin 01/01/14---> 01/01/14  Subjective:   Misty [name] feels fatigued but states that her swelling is less painful. Appetite fair. States the lesions on her forehead continue to drain. No nausea or vomiting.  Objective:    Filed Vitals:   01/02/14 0620 01/02/14 1013 01/02/14 1030 01/02/14 1300  BP: 96/54 107/54 115/62 122/57  Pulse:   76 92  Temp:   98.5 F (36.9 C) 98.3 F (36.8 C)  TempSrc:   Oral Oral  Resp:   18 17  Height:      Weight:      SpO2:   99% 96%    Intake/Output Summary (Last 24 hours) at 01/02/14 1608 Last data filed at 01/02/14 1537  Gross per 24 hour  Intake 2052.5 ml  Output   2000 ml  Net   52.5 ml    Exam: Gen:  NAD Head: Vesicular eruption to left forehead with vesicles beginning to crust/scab, significant left-sided peri-orbital swelling Cardiovascular:  RRR, No M/R/G Respiratory:  Lungs CTAB Gastrointestinal:  Abdomen soft, NT/ND, + BS Extremities:  No C/E/C   Data Reviewed:    Labs: Basic Metabolic Panel:  Recent Labs Lab January 03, 2014 2043 01/01/14 1904 01/02/14 0341  NA  --  137 134*  K  --  3.5* 3.2*  CL  --  97 96  CO2  --  24 25  GLUCOSE  --  109* 117*  BUN  --  17 19  CREATININE 1.10 1.17* 1.27*  CALCIUM  --  9.5 8.7   GFR Estimated Creatinine Clearance: 36.8 ml/min (by C-G formula  based on Cr of 1.27). Liver Function Tests:  Recent Labs Lab 01/02/14 0341  AST 16  ALT 15  ALKPHOS 91  BILITOT 0.3  PROT 7.0  ALBUMIN 3.1*   CBC:  Recent Labs Lab 12/31/13 1834 01/01/14 1904 01/02/14 0341  WBC 9.2 8.6 9.2  NEUTROABS  --  4.3 3.7  HGB 12.6 12.4 10.8*  HCT 40.9 37.3 32.8*  MCV 84.6 81.6 82.0  PLT  --  315 277   CBG:  Recent Labs Lab 01/01/14 2348 01/02/14 0755 01/02/14 1132  GLUCAP 149* 122* 144*   Hgb A1c:  Recent Labs  01/02/14 0341  HGBA1C 6.4*   Sepsis Labs:  Recent Labs Lab 12/31/13 1834 01/01/14 1904 01/01/14 1918  01/02/14 0341  WBC 9.2 8.6  --  9.2  LATICACIDVEN  --   --  1.30  --    Microbiology No results found for this or any previous visit (from the past 240 hour(s)).   Medications:   . antiseptic oral rinse  7 mL Mouth Rinse q12n4p  . chlorhexidine  15 mL Mouth Rinse BID  . enoxaparin (LOVENOX) injection  40 mg Subcutaneous QHS  . insulin aspart  0-9 Units Subcutaneous TID WC  . irbesartan  300 mg Oral Daily  . piperacillin-tazobactam (ZOSYN)  IV  3.375 g Intravenous 3 times per day  . polyethylene glycol  17 g Oral Daily  . potassium chloride  20 mEq Oral TID  . valACYclovir  1,000 mg Oral TID  . vancomycin  750 mg Intravenous Q12H   Continuous Infusions: . sodium chloride 75 mL/hr at 01/02/14 0022    Time spent: 35 minutes with > 50% of time discussing current diagnostic test results, clinical impression and plan of care.    LOS: 1 day   Misty [name]  [facility] Pager [phone]. If unable to reach me by pager, please call my cell phone at [phone].  *Please refer to [email or web address], password [email or web address] to get updated schedule on who will round on this patient, as hospitalists switch teams weekly. If 7PM-7AM, please contact night-coverage at [email or web address], password [email or web address] for any overnight needs.  01/02/2014, 4:08 PM

## 2014-01-02 NOTE — Plan of Care (Signed)
Problem: Phase I Progression Outcomes Goal: OOB as tolerated unless otherwise ordered Outcome: Completed/Met Date Met:  01/02/14 Patient ambulating with supervision only.  Steady with ambulation.

## 2014-01-02 NOTE — Progress Notes (Signed)
Nutrition Brief Note  Patient identified on the Malnutrition Screening Tool (MST) Report  Wt Readings from Last 5 Encounters:  01/02/14 160 lb (72.576 kg)  12/31/13 160 lb (72.576 kg)  11/29/13 161 lb (73.029 kg)  05/29/13 167 lb 2 oz (75.807 kg)  12/22/12 167 lb 4 oz (75.864 kg)    Body mass index is 29.26 kg/(m^2). Patient meets criteria for overweight based on current BMI.   Current diet order is heart healthy/CHO modified, patient is consuming approximately 85% of meals at this time. Labs and medications reviewed. Potassium low, getting oral replacement. Pt with history of hypertension and possible diabetes mellitus presents to the ER with worsening pain and swelling in the left periorbital area and maxillary area found to have left facial cellulitis with periorbital involvement.   - Met with pt who reports good appetite at home, eats small frequent meals f things like vegetables and soups - Reports 6 pound intentional weight loss recently from reducing portion sizes - Currently eating well with no nutritional concerns  No nutrition interventions warranted at this time. If nutrition issues arise, please consult RD.   Carlis Stable MS, [place], LDN [phone] Pager [phone] Weekend/After Hours Pager

## 2014-01-02 NOTE — Progress Notes (Signed)
ANTIBIOTIC CONSULT NOTE - INITIAL  Pharmacy Consult for Vancomycin and Zosyn  Indication: Cellulitis  Allergies  Allergen Reactions  . Lipitor [Atorvastatin Calcium] Nausea Only  . Lovastatin Nausea Only  . Simvastatin Other (See Comments)    gi upset  . Tramadol Other (See Comments)    sleepiness    Patient Measurements: Height: 5\' 2"  (157.5 cm) Weight: 160 lb 0.9 oz (72.6 kg) IBW/kg (Calculated) : 50.1 Adjusted Body Weight: =  Vital Signs: Temp: 98.1 F (36.7 C) (10/06 2330) Temp Source: Oral (10/06 2330) BP: 111/59 mmHg (10/06 2330) Pulse Rate: 70 (10/06 2330) Intake/Output from previous day:   Intake/Output from this shift:    Labs:  Recent Labs  12/31/13 1834 12/31/13 2043 01/01/14 1904  WBC 9.2  --  8.6  HGB 12.6  --  12.4  PLT  --   --  315  CREATININE  --  1.10 1.17*   Estimated Creatinine Clearance: 40 ml/min (by C-G formula based on Cr of 1.17). No results found for this basename: VANCOTROUGH, VANCOPEAK, VANCORANDOM, GENTTROUGH, GENTPEAK, GENTRANDOM, TOBRATROUGH, TOBRAPEAK, TOBRARND, AMIKACINPEAK, AMIKACINTROU, AMIKACIN,  in the last 72 hours   Microbiology: No results found for this or any previous visit (from the past 720 hour(s)).  Medical History: Past Medical History  Diagnosis Date  . Acute bronchitis 07/06/2008  . ALLERGIC RHINITIS 02/12/2008  . ANEMIA-IRON DEFICIENCY 02/12/2008  . DIABETES MELLITUS, TYPE II 02/12/2008  . FATIGUE 02/12/2008  . HYPERLIPIDEMIA 02/12/2008  . HYPERTENSION 11/05/2006  . KNEE PAIN, LEFT, ACUTE 05/11/2010  . LOW BACK PAIN 02/12/2008  . OSTEOPENIA 02/12/2008  . PEPTIC ULCER DISEASE 02/12/2008  . SINUSITIS- ACUTE-NOS 10/08/2008    Medications:  Anti-infectives   Start     Dose/Rate Route Frequency Ordered Stop   01/02/14 2200  vancomycin (VANCOCIN) IVPB 1000 mg/200 mL premix     1,000 mg 200 mL/hr over 60 Minutes Intravenous Every 24 hours 01/02/14 0249     01/01/14 2315  vancomycin (VANCOCIN) IVPB 1000  mg/200 mL premix     1,000 mg 200 mL/hr over 60 Minutes Intravenous  Once 01/01/14 2304 01/02/14 0122   01/01/14 2315  piperacillin-tazobactam (ZOSYN) IVPB 3.375 g     3.375 g 12.5 mL/hr over 240 Minutes Intravenous 3 times per day 01/01/14 2304     01/01/14 2300  valACYclovir (VALTREX) tablet 1,000 mg     1,000 mg Oral 3 times daily 01/01/14 2257 01/08/14 2159   01/01/14 1845  clindamycin (CLEOCIN) IVPB 600 mg     600 mg 100 mL/hr over 30 Minutes Intravenous  Once 01/01/14 1832 01/01/14 1931     Assessment: Patient with cellulitis.  First dose of antibiotics already given.  Goal of Therapy:  Vancomycin trough level 10-15 mcg/ml  Zosyn based on renal function   Plan:  Measure antibiotic drug levels at steady state Follow up culture results Vancomycin 1gm iv q24hr Zosyn 3.375g IV Q8H infused over 4hrs.   Tyler Deis, Shea Stakes [place] 01/02/2014,2:50 AM

## 2014-01-02 NOTE — Progress Notes (Signed)
ANTIBIOTIC CONSULT NOTE - FOLLOW UP  Pharmacy Consult for Vancomycin, Zosyn Indication: cellulitis  Allergies  Allergen Reactions  . Lipitor [Atorvastatin Calcium] Nausea Only  . Lovastatin Nausea Only  . Simvastatin Other (See Comments)    gi upset  . Tramadol Other (See Comments)    sleepiness    Patient Measurements: Height: 5\' 2"  (157.5 cm) Weight: 160 lb (72.576 kg) IBW/kg (Calculated) : 50.1  Vital Signs: Temp: 98.3 F (36.8 C) (10/07 1300) Temp Source: Oral (10/07 1300) BP: 122/57 mmHg (10/07 1300) Pulse Rate: 92 (10/07 1300) Intake/Output from previous day: 10/06 0701 - 10/07 0700 In: 922.5 [I.V.:422.5; IV Piggyback:500] Out: 400 [Urine:400] Intake/Output from this shift: Total I/O In: 480 [P.O.:480] Out: 1200 [Urine:1200]  Labs:  Recent Labs  12/31/13 1834 12/31/13 2043 01/01/14 1904 01/02/14 0341  WBC 9.2  --  8.6 9.2  HGB 12.6  --  12.4 10.8*  PLT  --   --  315 277  CREATININE  --  1.10 1.17* 1.27*   Estimated Creatinine Clearance: 36.8 ml/min (by C-G formula based on Cr of 1.27). No results found for this basename: VANCOTROUGH, VANCOPEAK, VANCORANDOM, GENTTROUGH, GENTPEAK, GENTRANDOM, TOBRATROUGH, TOBRAPEAK, TOBRARND, AMIKACINPEAK, AMIKACINTROU, AMIKACIN,  in the last 72 hours    Assessment: 32 yoF presents with worsening pain and swelling in the left periorbital area and maxillary area. Had received PO abx from urgent care but redness and swelling has worsened. Admitted for IV abx.  10/7>> vanc >> 10/7 >> zosyn >>  Tmax: AF WBC: WNL Renal: SCr 1.27 (increased from admission), CrCl ~44CG/N  No micro data.  Today is day #1 Vancomycin and Zosyn for left facial cellulitis with periorbital involvement. I want to increase to the VT goal to 15-20 since there is periorbital involvement.  Goal of Therapy:  Vancomycin trough level 15-20 mcg/ml Doses adjusted per renal function Eradication of infection  Plan:  1. Adjust vancomycin 1g IV q24h  to 750 mg IV q12h. 2. Continue Zosyn 3.375g IV q8h (4 hour infusion time).  3. F/u daily.  Misty [name] 01/02/2014,2:18 PM

## 2014-01-03 LAB — BASIC METABOLIC PANEL
ANION GAP: 11 (ref 5–15)
BUN: 15 mg/dL (ref 6–23)
CO2: 23 meq/L (ref 19–32)
Calcium: 8.7 mg/dL (ref 8.4–10.5)
Chloride: 103 mEq/L (ref 96–112)
Creatinine, Ser: 1.01 mg/dL (ref 0.50–1.10)
GFR calc Af Amer: 62 mL/min — ABNORMAL LOW (ref >90)
GFR calc non Af Amer: 54 mL/min — ABNORMAL LOW (ref >90)
Glucose, Bld: 166 mg/dL — ABNORMAL HIGH (ref 70–99)
Potassium: 4.3 mEq/L (ref 3.7–5.3)
Sodium: 137 mEq/L (ref 137–147)

## 2014-01-03 LAB — GLUCOSE, CAPILLARY
GLUCOSE-CAPILLARY: 144 mg/dL — AB (ref 70–99)
GLUCOSE-CAPILLARY: 118 mg/dL — AB (ref 70–99)
Glucose-Capillary: 115 mg/dL — ABNORMAL HIGH (ref 70–99)
Glucose-Capillary: 86 mg/dL (ref 70–99)

## 2014-01-03 NOTE — Progress Notes (Signed)
Subjective:    Patient ID: Misty [name], female    DOB: July 20, 1940, 73 y.o.   MRN: [id number]  This chart was scribed for Shawnee Knapp, MD by Edison Simon, ED Scribe. This patient was seen in room 2 and the patient's care was started at 2:12 PM.   Chief Complaint  Patient presents with  . Follow-up    with shaw    HPI  HPI Comments: Misty [name] is a 73 y.o. female who presents to the [facility] for follow up for cellulitis to her face, for which she was seen here yesterday. She states it has gotten worse since she was seen here. She states she has taken 2 doses of Bactrim and 1 dose of Clindomycin. She reports some fever, chills, and left sided neck pain. However, she has normal and non-painful ROM of her neck. She denies vision changes or pain with moving her eyeball. She denies nausea, vomiting, or diarrhea.  Her PCP is Dr. Cathlean Cower with [facility].  Past Medical History  Diagnosis Date  . Acute bronchitis 07/06/2008  . ALLERGIC RHINITIS 02/12/2008  . ANEMIA-IRON DEFICIENCY 02/12/2008  . DIABETES MELLITUS, TYPE II 02/12/2008  . FATIGUE 02/12/2008  . HYPERLIPIDEMIA 02/12/2008  . HYPERTENSION 11/05/2006  . KNEE PAIN, LEFT, ACUTE 05/11/2010  . LOW BACK PAIN 02/12/2008  . OSTEOPENIA 02/12/2008  . PEPTIC ULCER DISEASE 02/12/2008  . SINUSITIS- ACUTE-NOS 10/08/2008  . Facial cellulitis 01/01/2014  . Shingles outbreak 01/02/2014   No current facility-administered medications on file prior to visit.   Current Outpatient Prescriptions on File Prior to Visit  Medication Sig Dispense Refill  . clindamycin (CLEOCIN) 300 MG capsule Take 1 capsule (300 mg total) by mouth 3 (three) times daily.  42 capsule  0  . tiZANidine (ZANAFLEX) 4 MG tablet Take 1 tablet (4 mg total) by mouth every 6 (six) hours as needed for muscle spasms.  40 tablet  2   Allergies  Allergen Reactions  . Lipitor [Atorvastatin Calcium] Nausea Only  . Lovastatin Nausea Only  . Simvastatin Other  (See Comments)    gi upset  . Tramadol Other (See Comments)    sleepiness     Review of Systems  Constitutional: Positive for fever and chills.  Eyes: Negative for visual disturbance.  Gastrointestinal: Negative for nausea, vomiting and diarrhea.  Musculoskeletal: Positive for neck pain.  Skin: Positive for rash.       Swelling to face    BP 138/60  Pulse 93  Resp 18  SpO2 98%     Objective:   Physical Exam  Nursing note and vitals reviewed. Constitutional: She is oriented to person, place, and time. She appears well-developed and well-nourished.  HENT:  Head: Atraumatic.  erythematous edematous induration extending over left forehead and spreading along left upper and lower lid significant worsened from yesterday, smal area of ulceration in center of forehead with several pinpoint areas of erythema where patient tried to drain wound, erythema extendind up into scalp and over mid cheek, left eye becoming diffcult to visualize due to edema  Eyes: Conjunctivae are normal.  Neck: Normal range of motion. Neck supple.  Pulmonary/Chest: Effort normal.  Musculoskeletal: Normal range of motion.  Neurological: She is alert and oriented to person, place, and time.  Skin: Skin is warm and dry. There is erythema.  Psychiatric: She has a normal mood and affect.        Assessment & Plan:  Since her cellulites is worsening  and is affecting both her upper and lower left eyelid, I will call the [facility] to see about having her directly admitted.  Where not able to get a call back from [facility] (PCP is w/ [facility]) so pt instructed to go to ER for IV antibiotics and admission.  Preseptal cellulitis of left eye   I personally performed the services described in this documentation, which was scribed in my presence. The recorded information has been reviewed and considered, and addended by me as needed.  Delman Cheadle, MD MPH

## 2014-01-03 NOTE — Progress Notes (Signed)
Progress Note   Misty [name] AQT:[id number] DOB: [birth year] DOA: 01/01/2014 PCP: Cathlean Cower, MD   Brief Narrative:   Misty [name] is an 73 y.o. female with a PMH of hypertension and diabetes who initially presented to the urgent care center 2014/01/16 with soft tissue swelling around the left eye. She was re-evaluated 01/01/14 and referred to the ER for inpatient evaluation of her worsening facial cellulitis. Upon evaluation in the ER, the patient was noted to have a vesicular rash to her forehead. CT scan of the maxillofacial area showed significant soft tissue swelling.  Assessment/Plan:   Principal Problem:   Facial cellulitis / shingles outbreak  Continue empiric Zosyn, vancomycin and Valtrex. Erythromycin ointment added by ophthalmologist, no ocular involvement at this time. Seen by Dr. Posey Pronto 01/02/14.  Continue contact/airborne isolation.  Active Problems:   Acute kidney injury  Hold Avapro and continue to gently hydrate.    Hypokalemia  Supplementing.    Impaired fasting glucose  Hemoglobin A1c is 6.4%.  Continue carbohydrate modified diet.  Currently being managed with SSI. CBGs 122-149.    Essential hypertension  Hold Avapro secondary to acute kidney injury.    DVT Prophylaxis  Continue Lovenox.  Code Status: Full. Family Communication: No family currently at the bedside Disposition Plan: Home when stable.   IV Access:    Peripheral IV   Procedures and diagnostic studies:   Ct Maxillofacial W/cm 01/16/2014: 1. Soft tissue swelling about the left orbit and overlying the left frontal calvarium, with associated skin thickening. This raises concern for cellulitis. No evidence of abscess. 2. The orbits remain intact bilaterally. 3. Partial absence of multiple maxillary and mandibular teeth, with associated periapical abscesses.    Medical Consultants:    Dr. Lamonte Sakai, Ophthalmology   Other Consultants:    None.   Anti-Infectives:     Vancomycin 01/02/14--->  Zosyn 01/01/14--->  Valtrex 01/01/14--->  Clindamycin 01/01/14---> 01/01/14  Subjective:   Misty [name] feels a bit better today, less lethargic, up in room. Appetite fair. States the lesions on her forehead continue to drain. No nausea or vomiting. Bowels moved today.  Objective:    Filed Vitals:   01/02/14 1030 01/02/14 1300 01/02/14 2152 01/03/14 0450  BP: 115/62 122/57 119/54 134/58  Pulse: 76 92 78 78  Temp: 98.5 F (36.9 C) 98.3 F (36.8 C) 98.2 F (36.8 C) 98.1 F (36.7 C)  TempSrc: Oral Oral Oral Oral  Resp: 18 17 16 14   Height:      Weight:      SpO2: 99% 96% 98% 97%    Intake/Output Summary (Last 24 hours) at 01/03/14 0808 Last data filed at 01/03/14 5625  Gross per 24 hour  Intake 1992.4 ml  Output   3900 ml  Net -1907.6 ml    Exam: Gen:  NAD Head: Vesicular eruption to left forehead with vesicles beginning to crust/scab, slightly decreased left-sided peri-orbital swelling Cardiovascular:  RRR, No M/R/G Respiratory:  Lungs CTAB Gastrointestinal:  Abdomen soft, NT/ND, + BS Extremities:  No C/E/C   Data Reviewed:    Labs: Basic Metabolic Panel:  Recent Labs Lab 01-16-14 2043 01/01/14 1904 01/02/14 0341  NA  --  137 134*  K  --  3.5* 3.2*  CL  --  97 96  CO2  --  24 25  GLUCOSE  --  109* 117*  BUN  --  17 19  CREATININE 1.10 1.17* 1.27*  CALCIUM  --  9.5 8.7  GFR Estimated Creatinine Clearance: 36.8 ml/min (by C-G formula based on Cr of 1.27). Liver Function Tests:  Recent Labs Lab 01/02/14 0341  AST 16  ALT 15  ALKPHOS 91  BILITOT 0.3  PROT 7.0  ALBUMIN 3.1*   CBC:  Recent Labs Lab 12/31/13 1834 01/01/14 1904 01/02/14 0341  WBC 9.2 8.6 9.2  NEUTROABS  --  4.3 3.7  HGB 12.6 12.4 10.8*  HCT 40.9 37.3 32.8*  MCV 84.6 81.6 82.0  PLT  --  315 277   CBG:  Recent Labs Lab 01/01/14 2348 01/02/14 0755 01/02/14 1132 01/02/14 1702 01/02/14 2149  GLUCAP 149* 122* 144* 107* 105*   Hgb  A1c:  Recent Labs  01/02/14 0341  HGBA1C 6.4*   Sepsis Labs:  Recent Labs Lab 12/31/13 1834 01/01/14 1904 01/01/14 1918 01/02/14 0341  WBC 9.2 8.6  --  9.2  LATICACIDVEN  --   --  1.30  --    Microbiology No results found for this or any previous visit (from the past 240 hour(s)).   Medications:   . antiseptic oral rinse  7 mL Mouth Rinse q12n4p  . chlorhexidine  15 mL Mouth Rinse BID  . enoxaparin (LOVENOX) injection  40 mg Subcutaneous QHS  . insulin aspart  0-9 Units Subcutaneous TID WC  . piperacillin-tazobactam (ZOSYN)  IV  3.375 g Intravenous 3 times per day  . polyethylene glycol  17 g Oral Daily  . valACYclovir  1,000 mg Oral TID  . vancomycin  750 mg Intravenous Q12H   Continuous Infusions:    Time spent: 25 minutes.    LOS: 2 days   RAMA,CHRISTINA  [facility] Pager [phone]. If unable to reach me by pager, please call my cell phone at [phone].  *Please refer to [email or web address], password [email or web address] to get updated schedule on who will round on this patient, as hospitalists switch teams weekly. If 7PM-7AM, please contact night-coverage at [email or web address], password [email or web address] for any overnight needs.  01/03/2014, 8:08 AM

## 2014-01-04 DIAGNOSIS — R197 Diarrhea, unspecified: Secondary | ICD-10-CM | POA: Diagnosis present

## 2014-01-04 LAB — GLUCOSE, CAPILLARY
GLUCOSE-CAPILLARY: 110 mg/dL — AB (ref 70–99)
Glucose-Capillary: 112 mg/dL — ABNORMAL HIGH (ref 70–99)
Glucose-Capillary: 101 mg/dL — ABNORMAL HIGH (ref 70–99)
Glucose-Capillary: 89 mg/dL (ref 70–99)

## 2014-01-04 MED ORDER — CALAMINE EX LOTN
TOPICAL_LOTION | CUTANEOUS | Status: DC | PRN
  Administered 2014-01-04 (×2): via TOPICAL
  Filled 2014-01-04: qty 118

## 2014-01-04 MED ORDER — ERYTHROMYCIN 5 MG/GM OP OINT
TOPICAL_OINTMENT | Freq: Three times a day (TID) | OPHTHALMIC | Status: DC
  Administered 2014-01-04: 1 via OPHTHALMIC
  Administered 2014-01-04 – 2014-01-05 (×3): via OPHTHALMIC
  Filled 2014-01-04: qty 3.5

## 2014-01-04 NOTE — Progress Notes (Signed)
Progress Note   Misty [name] YWV:[id number] DOB: [birth year] DOA: 01/01/2014 PCP: Cathlean Cower, MD   Brief Narrative:   Misty [name] is an 73 y.o. female with a PMH of hypertension and diabetes who initially presented to the urgent care center 01-29-2014 with soft tissue swelling around the left eye. She was re-evaluated 01/01/14 and referred to the ER for inpatient evaluation of her worsening facial cellulitis. Upon evaluation in the ER, the patient was noted to have a vesicular rash to her forehead. CT scan of the maxillofacial area showed significant soft tissue swelling.  Assessment/Plan:   Principal Problem:   Facial cellulitis / shingles outbreak  Continue empiric Zosyn, vancomycin and Valtrex. Erythromycin ointment added by ophthalmologist, no ocular involvement at this time. Seen by Dr. Posey Pronto 01/02/14.  Continue contact/airborne isolation.  Calamine lotion to shingles rash ordered.  Active Problems:   Diarrhea  Will check C. difficile PCR given treatment with antibiotics.    Acute kidney injury  Resolved with gentle hydration and holding Avapro.    Hypokalemia  Resolved with supplementation.    Impaired fasting glucose  Hemoglobin A1c is 6.4%.  Continue carbohydrate modified diet.  Currently being managed with SSI. CBGs 86-144.    Essential hypertension  Hold Avapro secondary to acute kidney injury.    DVT Prophylaxis  Continue Lovenox.  Code Status: Full. Family Communication: No family currently at the bedside Disposition Plan: Home when stable.   IV Access:    Peripheral IV   Procedures and diagnostic studies:   Ct Maxillofacial W/cm Jan 29, 2014: 1. Soft tissue swelling about the left orbit and overlying the left frontal calvarium, with associated skin thickening. This raises concern for cellulitis. No evidence of abscess. 2. The orbits remain intact bilaterally. 3. Partial absence of multiple maxillary and mandibular teeth, with associated  periapical abscesses.    Medical Consultants:    Dr. Lamonte Sakai, Ophthalmology   Other Consultants:    None.   Anti-Infectives:    Vancomycin 01/02/14--->  Zosyn 01/01/14--->  Valtrex 01/01/14--->  Clindamycin 01/01/14---> 01/01/14  Subjective:   Misty [name] has had 2 episodes of diarrhea today. Feels a little worse today. Left periorbital swelling improved but shingles rash still draining.  Objective:    Filed Vitals:   01/03/14 0450 01/03/14 1407 01/03/14 2141 01/04/14 0516  BP: 134/58 147/65 120/63 124/56  Pulse: 78 81 73 78  Temp: 98.1 F (36.7 C) 97.7 F (36.5 C) 97.9 F (36.6 C) 97.6 F (36.4 C)  TempSrc: Oral Oral Oral Oral  Resp: 14 16 14 14   Height:      Weight:      SpO2: 97% 100% 99% 100%    Intake/Output Summary (Last 24 hours) at 01/04/14 0809 Last data filed at 01/04/14 0517  Gross per 24 hour  Intake    840 ml  Output      0 ml  Net    840 ml    Exam: Gen:  NAD Head: Vesicular eruption to left forehead with vesicles beginning to crust/scab, decreased left-sided peri-orbital swelling Cardiovascular:  RRR, No M/R/G Respiratory:  Lungs CTAB Gastrointestinal:  Abdomen soft, NT/ND, + BS Extremities:  No C/E/C   Data Reviewed:    Labs: Basic Metabolic Panel:  Recent Labs Lab Jan 29, 2014 2043  01/01/14 1904 01/02/14 0341 01/03/14 0830  NA  --   --  137 134* 137  K  --   < > 3.5* 3.2* 4.3  CL  --   --  97 96 103  CO2  --   --  24 25 23   GLUCOSE  --   --  109* 117* 166*  BUN  --   --  17 19 15   CREATININE 1.10  --  1.17* 1.27* 1.01  CALCIUM  --   --  9.5 8.7 8.7  < > = values in this interval not displayed. GFR Estimated Creatinine Clearance: 46.3 ml/min (by C-G formula based on Cr of 1.01). Liver Function Tests:  Recent Labs Lab 01/02/14 0341  AST 16  ALT 15  ALKPHOS 91  BILITOT 0.3  PROT 7.0  ALBUMIN 3.1*   CBC:  Recent Labs Lab 12/31/13 1834 01/01/14 1904 01/02/14 0341  WBC 9.2 8.6 9.2  NEUTROABS  --   4.3 3.7  HGB 12.6 12.4 10.8*  HCT 40.9 37.3 32.8*  MCV 84.6 81.6 82.0  PLT  --  315 277   CBG:  Recent Labs Lab 01/02/14 2149 01/03/14 0748 01/03/14 1243 01/03/14 1804 01/03/14 2149  GLUCAP 105* 144* 118* 115* 86   Hgb A1c:  Recent Labs  01/02/14 0341  HGBA1C 6.4*   Sepsis Labs:  Recent Labs Lab 12/31/13 1834 01/01/14 1904 01/01/14 1918 01/02/14 0341  WBC 9.2 8.6  --  9.2  LATICACIDVEN  --   --  1.30  --    Microbiology No results found for this or any previous visit (from the past 240 hour(s)).   Medications:   . antiseptic oral rinse  7 mL Mouth Rinse q12n4p  . chlorhexidine  15 mL Mouth Rinse BID  . enoxaparin (LOVENOX) injection  40 mg Subcutaneous QHS  . insulin aspart  0-9 Units Subcutaneous TID WC  . piperacillin-tazobactam (ZOSYN)  IV  3.375 g Intravenous 3 times per day  . polyethylene glycol  17 g Oral Daily  . valACYclovir  1,000 mg Oral TID  . vancomycin  750 mg Intravenous Q12H   Continuous Infusions:    Time spent: 25 minutes.    LOS: 3 days   [facility] Pager [phone]. If unable to reach me by pager, please call my cell phone at [phone].  *Please refer to [email or web address], password [email or web address] to get updated schedule on who will round on this patient, as hospitalists switch teams weekly. If 7PM-7AM, please contact night-coverage at [email or web address], password [email or web address] for any overnight needs.  01/04/2014, 8:09 AM

## 2014-01-04 NOTE — Care Management Note (Signed)
CARE MANAGEMENT NOTE 01/04/2014  Patient:  Misty [name], Misty [name]   Account Number:  [id number]  Date Initiated:  01/04/2014  Documentation initiated by:  Marney Doctor  Subjective/Objective Assessment:   73 yo female admitted with facial cellulitis.     Action/Plan:   From home alone   Anticipated DC Date:  01/07/2014   Anticipated DC Plan:  HOME/SELF CARE         Choice offered to / List presented to:             Status of service:  In process, will continue to follow Medicare Important Message given?  YES (If response is "NO", the following Medicare IM given date fields will be blank) Date Medicare IM given:  01/04/2014 Medicare IM given by:  Marney Doctor Date Additional Medicare IM given:   Additional Medicare IM given by:    Discharge Disposition:    Per UR Regulation:  Reviewed for med. necessity/level of care/duration of stay  If discussed at [place] of Stay Meetings, dates discussed:    Comments:  01/04/14 Marney Doctor RN,BSN,NCM Met with pt about DC needs.  No noted at this time.  CM will continue to follow.

## 2014-01-05 LAB — VANCOMYCIN, TROUGH: Vancomycin Tr: 19.8 ug/mL (ref 10.0–20.0)

## 2014-01-05 LAB — BASIC METABOLIC PANEL
Anion gap: 14 (ref 5–15)
BUN: 14 mg/dL (ref 6–23)
CHLORIDE: 106 meq/L (ref 96–112)
CO2: 21 meq/L (ref 19–32)
Calcium: 9.7 mg/dL (ref 8.4–10.5)
Creatinine, Ser: 0.92 mg/dL (ref 0.50–1.10)
GFR calc Af Amer: 70 mL/min — ABNORMAL LOW (ref >90)
GFR calc non Af Amer: 60 mL/min — ABNORMAL LOW (ref >90)
GLUCOSE: 101 mg/dL — AB (ref 70–99)
POTASSIUM: 4.5 meq/L (ref 3.7–5.3)
Sodium: 141 mEq/L (ref 137–147)

## 2014-01-05 LAB — GLUCOSE, CAPILLARY
GLUCOSE-CAPILLARY: 152 mg/dL — AB (ref 70–99)
GLUCOSE-CAPILLARY: 83 mg/dL (ref 70–99)
Glucose-Capillary: 115 mg/dL — ABNORMAL HIGH (ref 70–99)

## 2014-01-05 LAB — CLOSTRIDIUM DIFFICILE BY PCR: Toxigenic C. Difficile by PCR: NEGATIVE

## 2014-01-05 MED ORDER — OXYCODONE HCL 5 MG PO TABS: 5.0000 mg | ORAL_TABLET | ORAL | Status: DC | PRN

## 2014-01-05 MED ORDER — VALACYCLOVIR HCL 1 G PO TABS: 1000.0000 mg | ORAL_TABLET | Freq: Three times a day (TID) | ORAL | Status: DC

## 2014-01-05 MED ORDER — CLINDAMYCIN HCL 300 MG PO CAPS: 300.0000 mg | ORAL_CAPSULE | Freq: Three times a day (TID) | ORAL | Status: DC

## 2014-01-05 NOTE — Discharge Instructions (Signed)
Cellulitis Cellulitis is an infection of the skin and the tissue beneath it. The infected area is usually red and tender. Cellulitis occurs most often in the arms and lower legs.  CAUSES  Cellulitis is caused by bacteria that enter the skin through cracks or cuts in the skin. The most common types of bacteria that cause cellulitis are staphylococci and streptococci. SIGNS AND SYMPTOMS   Redness and warmth.  Swelling.  Tenderness or pain.  Fever. DIAGNOSIS  Your health care provider can usually determine what is wrong based on a physical exam. Blood tests may also be done. TREATMENT  Treatment usually involves taking an antibiotic medicine. HOME CARE INSTRUCTIONS   Take your antibiotic medicine as directed by your health care provider. Finish the antibiotic even if you start to feel better.  Keep the infected arm or leg elevated to reduce swelling.  Apply a warm cloth to the affected area up to 4 times per day to relieve pain.  Take medicines only as directed by your health care provider.  Keep all follow-up visits as directed by your health care provider. SEEK MEDICAL CARE IF:   You notice red streaks coming from the infected area.  Your red area gets larger or turns dark in color.  Your bone or joint underneath the infected area becomes painful after the skin has healed.  Your infection returns in the same area or another area.  You notice a swollen bump in the infected area.  You develop new symptoms.  You have a fever. SEEK IMMEDIATE MEDICAL CARE IF:   You feel very sleepy.  You develop vomiting or diarrhea.  You have a general ill feeling (malaise) with muscle aches and pains. MAKE SURE YOU:   Understand these instructions.  Will watch your condition.  Will get help right away if you are not doing well or get worse. Document Released: 12/23/2004 Document Revised: 07/30/2013 Document Reviewed: 05/31/2011 [facility] Patient Information 2015 [place], [place].  This information is not intended to replace advice given to you by your health care provider. Make sure you discuss any questions you have with your health care provider.  Shingles Shingles (herpes zoster) is an infection that is caused by the same virus that causes chickenpox (varicella). The infection causes a painful skin rash and fluid-filled blisters, which eventually break open, crust over, and heal. It may occur in any area of the body, but it usually affects only one side of the body or face. The pain of shingles usually lasts about 1 month. However, some people with shingles may develop long-term (chronic) pain in the affected area of the body. Shingles often occurs many years after the person had chickenpox. It is more common:  In people older than 50 years.  In people with weakened immune systems, such as those with HIV, AIDS, or cancer.  In people taking medicines that weaken the immune system, such as transplant medicines.  In people under great stress. CAUSES  Shingles is caused by the varicella zoster virus (VZV), which also causes chickenpox. After a person is infected with the virus, it can remain in the person's body for years in an inactive state (dormant). To cause shingles, the virus reactivates and breaks out as an infection in a nerve root. The virus can be spread from person to person (contagious) through contact with open blisters of the shingles rash. It will only spread to people who have not had chickenpox. When these people are exposed to the virus, they may develop chickenpox.  They will not develop shingles. Once the blisters scab over, the person is no longer contagious and cannot spread the virus to others. SIGNS AND SYMPTOMS  Shingles shows up in stages. The initial symptoms may be pain, itching, and tingling in an area of the skin. This pain is usually described as burning, stabbing, or throbbing.In a few days or weeks, a painful red rash will appear in the area  where the pain, itching, and tingling were felt. The rash is usually on one side of the body in a band or belt-like pattern. Then, the rash usually turns into fluid-filled blisters. They will scab over and dry up in approximately 2-3 weeks. Flu-like symptoms may also occur with the initial symptoms, the rash, or the blisters. These may include:  Fever.  Chills.  Headache.  Upset stomach. DIAGNOSIS  Your health care provider will perform a skin exam to diagnose shingles. Skin scrapings or fluid samples may also be taken from the blisters. This sample will be examined under a microscope or sent to a lab for further testing. TREATMENT  There is no specific cure for shingles. Your health care provider will likely prescribe medicines to help you manage the pain, recover faster, and avoid long-term problems. This may include antiviral drugs, anti-inflammatory drugs, and pain medicines. HOME CARE INSTRUCTIONS   Take a cool bath or apply cool compresses to the area of the rash or blisters as directed. This may help with the pain and itching.   Take medicines only as directed by your health care provider.   Rest as directed by your health care provider.  Keep your rash and blisters clean with mild soap and cool water or as directed by your health care provider.  Do not pick your blisters or scratch your rash. Apply an anti-itch cream or numbing creams to the affected area as directed by your health care provider.  Keep your shingles rash covered with a loose bandage (dressing).  Avoid skin contact with:  Babies.   Pregnant women.   Children with eczema.   Elderly people with transplants.   People with chronic illnesses, such as leukemia or AIDS.   Wear loose-fitting clothing to help ease the pain of material rubbing against the rash.  Keep all follow-up visits as directed by your health care provider.If the area involved is on your face, you may receive a referral for a  specialist, such as an eye doctor (ophthalmologist) or an ear, nose, and throat (ENT) doctor. Keeping all follow-up visits will help you avoid eye problems, chronic pain, or disability.  SEEK IMMEDIATE MEDICAL CARE IF:   You have facial pain, pain around the eye area, or loss of feeling on one side of your face.  You have ear pain or ringing in your ear.  You have loss of taste.  Your pain is not relieved with prescribed medicines.   Your redness or swelling spreads.   You have more pain and swelling.  Your condition is worsening or has changed.   You have a fever. MAKE SURE YOU:  Understand these instructions.  Will watch your condition.  Will get help right away if you are not doing well or get worse. Document Released: 03/15/2005 Document Revised: 07/30/2013 Document Reviewed: 10/28/2011 [facility] Patient Information 2015 [place], [place]. This information is not intended to replace advice given to you by your health care provider. Make sure you discuss any questions you have with your health care provider.

## 2014-01-05 NOTE — Progress Notes (Signed)
Patient d/c home.stable. In good spirits.-No c/o  Pain.- Sandie Ano RN

## 2014-01-05 NOTE — Progress Notes (Signed)
ANTIBIOTIC CONSULT NOTE - FOLLOW UP  Pharmacy Consult for Vancomycin, Zosyn Indication: cellulitis  Allergies  Allergen Reactions  . Lipitor [Atorvastatin Calcium] Nausea Only  . Lovastatin Nausea Only  . Simvastatin Other (See Comments)    gi upset  . Tramadol Other (See Comments)    sleepiness    Patient Measurements: Height: 5\' 2"  (157.5 cm) Weight: 160 lb (72.576 kg) IBW/kg (Calculated) : 50.1  Vital Signs: Temp: 97.5 F (36.4 C) (10/10 0542) Temp Source: Oral (10/10 0542) BP: 133/59 mmHg (10/10 0542) Pulse Rate: 78 (10/10 0542) Intake/Output from previous day: 10/09 0701 - 10/10 0700 In: 2060 [P.O.:1860; IV Piggyback:200] Out: 1800 [Urine:1800] Intake/Output from this shift:    Labs:  Recent Labs  01/03/14 0830 01/05/14 0540  CREATININE 1.01 0.92   Estimated Creatinine Clearance: 50.8 ml/min (by C-G formula based on Cr of 0.92).  Recent Labs  01/05/14 0540  VANCOTROUGH 19.8      Assessment: 88 yoF presents with worsening pain and swelling in the left periorbital area and maxillary area. She had received PO clindamycin from urgent care but redness and swelling has worsened. Pharmacy consulted to dose IV vancomycin and Zosyn for left facial cellulitis with periorbital involvement.  10/7 >> Vanc >> 10/7 >> Zosyn >> 10/7 >> Valtrex >> 10/9 >> erythromycin ophth oint >>  Tmax: AF WBC: WNL Renal: SCr improved to 0.92, CrCl ~51 ml/min Vancomycin trough level: 19.8, therapeutic at upper end of goal range.  Goal of Therapy:  Vancomycin trough level 15-20 mcg/ml Doses adjusted per renal function Eradication of infection  Plan:   Continue Zosyn 3.375g IV Q8H infused over 4hrs.  Continue Vancomycin 750mg  IV q12h.  Measure Vanc trough at steady state.  Follow up renal fxn and culture results.   Gretta Arab PharmD, BCPS Pager [phone] 01/05/2014 7:29 AM

## 2014-01-05 NOTE — Progress Notes (Signed)
Patient d/c instructions reviewed with the patient,teach back utilized,all questions answered by this nurse to the patient's satisfaction. Denies pain. Stable. Patient just finishing her IV zosyn and will go home at  1800.- Sandie Ano RN

## 2014-01-05 NOTE — Discharge Summary (Signed)
Physician Discharge Summary  Misty [name] DJS:[id number] DOB: 12-06-1940 DOA: 01/01/2014  PCP: Cathlean Cower, MD  Admit date: 01/01/2014 Discharge date: 01/05/2014   Recommendations for Outpatient Follow-Up:   1. Recommend outpatient followup with PCP in one week to ensure resolution of cellulitis and healing shingles lesions. 2. Note: Hemoglobin A1c 6.4%. Patient at high risk for developing diabetes. Recommend close outpatient followup   Discharge Diagnosis:   Principal Problem:    Facial cellulitis in the setting of shingles outbreak Active Problems:    Essential hypertension    Impaired fasting glucose    Hypokalemia    Acute kidney injury    Diarrhea  Discharge Condition: Improved.  Diet recommendation: Low sodium, heart healthy.  Carbohydrate-modified.     History of Present Illness:   Misty [name] is an 73 y.o. female with a PMH of hypertension and diabetes who initially presented to the urgent care center 01-Jan-2014 with soft tissue swelling around the left eye. She was re-evaluated 01/01/14 and referred to the ER for inpatient evaluation of her worsening facial cellulitis. Upon evaluation in the ER, the patient was noted to have a vesicular rash to her forehead. CT scan of the maxillofacial area showed significant soft tissue swelling.  Hospital Course by Problem:   Principal Problem:  Facial cellulitis / shingles outbreak  Treated with empiric Zosyn, vancomycin and Valtrex. Discharge home on an additional 7 days of therapy with clindamycin and Valtrex. Erythromycin ointment added by ophthalmologist, no ocular involvement at this time. Seen by Dr. Posey Pronto 01/02/14.  Continue Calamine lotion to shingles rash as needed.  Active Problems:  Diarrhea  Stools negative for C. difficile.  Acute kidney injury  Resolved with gentle hydration and holding Avapro.  Hypokalemia  Resolved with supplementation.  Impaired fasting glucose  Hemoglobin A1c is 6.4%.    Continue carbohydrate modified diet.   Essential hypertension  Okay to resume Avapro.   Medical Consultants:    Dr. Lamonte Sakai, Ophthalmology   Discharge Exam:   Filed Vitals:   01/05/14 1335  BP: 124/79  Pulse: 76  Temp: 98.1 F (36.7 C)  Resp: 16   Filed Vitals:   01/04/14 2025 01/04/14 2150 01/05/14 0542 01/05/14 1335  BP:  122/54 133/59 124/79  Pulse: 88 71 78 76  Temp:  97.9 F (36.6 C) 97.5 F (36.4 C) 98.1 F (36.7 C)  TempSrc:  Oral Oral Oral  Resp:  14 14 16   Height:      Weight:      SpO2:  98%  99%    Gen:  NAD Cardiovascular:  RRR, No M/R/G Respiratory: Lungs CTAB Gastrointestinal: Abdomen soft, NT/ND with normal active bowel sounds. Extremities: No C/E/C Skin: Erythema/induration of peri-orbital area improved. Blisters/pustules on the left forehead beginning to crust over   The results of significant diagnostics from this hospitalization (including imaging, microbiology, ancillary and laboratory) are listed below for reference.     Procedures and Diagnostic Studies:   Ct Maxillofacial W/cm 2014-01-01: 1. Soft tissue swelling about the left orbit and overlying the left frontal calvarium, with associated skin thickening. This raises concern for cellulitis. No evidence of abscess. 2. The orbits remain intact bilaterally. 3. Partial absence of multiple maxillary and mandibular teeth, with associated periapical abscesses.    Labs:   Basic Metabolic Panel:  Recent Labs Lab 2014/01/01 2043  01/01/14 1904 01/02/14 0341 01/03/14 0830 01/05/14 0540  NA  --   --  137 134* 137 141  K  --   < >  3.5* 3.2* 4.3 4.5  CL  --   --  97 96 103 106  CO2  --   --  24 25 23 21   GLUCOSE  --   --  109* 117* 166* 101*  BUN  --   --  17 19 15 14   CREATININE 1.10  --  1.17* 1.27* 1.01 0.92  CALCIUM  --   --  9.5 8.7 8.7 9.7  < > = values in this interval not displayed. GFR Estimated Creatinine Clearance: 50.8 ml/min (by C-G formula based on Cr of  0.92). Liver Function Tests:  Recent Labs Lab 01/02/14 0341  AST 16  ALT 15  ALKPHOS 91  BILITOT 0.3  PROT 7.0  ALBUMIN 3.1*   CBC:  Recent Labs Lab 12/31/13 1834 01/01/14 1904 01/02/14 0341  WBC 9.2 8.6 9.2  NEUTROABS  --  4.3 3.7  HGB 12.6 12.4 10.8*  HCT 40.9 37.3 32.8*  MCV 84.6 81.6 82.0  PLT  --  315 277   CBG:  Recent Labs Lab 01/04/14 1244 01/04/14 1735 01/04/14 2147 01/05/14 0734 01/05/14 1211  GLUCAP 101* 89 110* 152* 115*   Microbiology Recent Results (from the past 240 hour(s))  CLOSTRIDIUM DIFFICILE BY PCR     Status: None   Collection Time    01/05/14 10:54 AM      Result Value Ref Range Status   C difficile by pcr NEGATIVE  NEGATIVE Final   Comment: Performed at [facility]     Discharge Instructions:   Discharge Instructions   Call MD for:  extreme fatigue    Complete by:  As directed      Call MD for:  persistant nausea and vomiting    Complete by:  As directed      Call MD for:  severe uncontrolled pain    Complete by:  As directed      Call MD for:  temperature >100.4    Complete by:  As directed      Diet - low sodium heart healthy    Complete by:  As directed      Diet Carb Modified    Complete by:  As directed      Discharge instructions    Complete by:  As directed   Pregnant women and young children who have not been immunized against chickenpox should avoid contact with you until the blisters on your forehead are completely crusted over and are no longer draining fluid.  You were cared for by Dr. Jacquelynn Cree  (a hospitalist) during your hospital stay. If you have any questions about your discharge medications or the care you received while you were in the hospital after you are discharged, you can call the unit and ask to speak with the hospitalist on call if the hospitalist that took care of you is not available. Once you are discharged, your primary care physician will handle any further medical issues. Please  note that NO REFILLS for any discharge medications will be authorized once you are discharged, as it is imperative that you return to your primary care physician (or establish a relationship with a primary care physician if you do not have one) for your aftercare needs so that they can reassess your need for medications and monitor your lab values.  Any outstanding tests can be reviewed by your PCP at your follow up visit.  It is also important to review any medicine changes with your PCP.  Please bring these  d/c instructions with you to your next visit so your physician can review these changes with you.     Increase activity slowly    Complete by:  As directed             Medication List         aspirin EC 81 MG tablet  Take 81 mg by mouth daily with breakfast.     clindamycin 300 MG capsule  Commonly known as:  CLEOCIN  Take 1 capsule (300 mg total) by mouth 3 (three) times daily. For 7 more days.     oxyCODONE 5 MG immediate release tablet  Commonly known as:  Oxy IR/ROXICODONE  Take 1 tablet (5 mg total) by mouth every 4 (four) hours as needed for moderate pain.     tiZANidine 4 MG tablet  Commonly known as:  ZANAFLEX  Take 1 tablet (4 mg total) by mouth every 6 (six) hours as needed for muscle spasms.     valACYclovir 1000 MG tablet  Commonly known as:  VALTREX  Take 1 tablet (1,000 mg total) by mouth 3 (three) times daily.     valsartan-hydrochlorothiazide 320-25 MG per tablet  Commonly known as:  DIOVAN-HCT  Take 1 tablet by mouth daily with breakfast.           Follow-up Information   Follow up with Cathlean Cower, MD. Schedule an appointment as soon as possible for a visit in 1 week. [facility] follow up, to ensure ongoing healing of lesions on forehead.)    Specialties:  Internal Medicine, Radiology   Contact information:   [place] [place] [address] [phone]        Time coordinating discharge: 35  minutes.  Signed:  RAMA,CHRISTINA  Pager [phone] [facility] 01/05/2014, 3:30 PM

## 2014-01-07 ENCOUNTER — Telehealth: Payer: Self-pay | Admitting: *Deleted

## 2014-01-07 LAB — GI PATHOGEN PANEL BY PCR, STOOL
C difficile toxin A/B: NEGATIVE
CRYPTOSPORIDIUM BY PCR: NEGATIVE
Campylobacter by PCR: NEGATIVE
E COLI 0157 BY PCR: NEGATIVE
E coli (ETEC) LT/ST: NEGATIVE
E coli (STEC): NEGATIVE
G lamblia by PCR: NEGATIVE
NOROVIRUS G1/G2: NEGATIVE
ROTAVIRUS A BY PCR: NEGATIVE
SHIGELLA BY PCR: NEGATIVE
Salmonella by PCR: NEGATIVE

## 2014-01-07 NOTE — Telephone Encounter (Signed)
Called pt concerning TCM appt on home & cell no answer. LMOM (cell) to RTC ASAP...Misty [name]

## 2014-01-08 NOTE — Telephone Encounter (Signed)
Transition Care Management Follow-up Telephone Call D/C10/10/15  How have you been since you were released from the hospital? Pt stated she is doing ok   Do you understand why you were in the hospital? YES, she understood why she was admitted to hospital   Do you understand the discharge instrcutions? YES, she understood d/c summary we reviewed again  Items Reviewed:  Medications reviewed: YES, reviewed  Allergies reviewed: YES, reviewed  Dietary changes reviewed: YES, low sodium, heart healthy  Referrals reviewed: NO referral needed   Functional Questionnaire:   Activities of Daily Living (ADLs):   She states she is  independent in the following bathing and dressing herself, walking, ect States she doesn't  require assistance  Any transportation issues/concerns?: NO   Any patient concerns? NO   Confirmed importance and date/time of follow-up visits scheduled: YES, made appt to see Dr. Jenny Reichmann 01/11/14, advise pt to make sure she keep appt   Confirmed with patient if condition begins to worsen call PCP or go to the ER.  Patient was given the Call-a-Nurse line [phone]: YES

## 2014-01-11 ENCOUNTER — Ambulatory Visit (INDEPENDENT_AMBULATORY_CARE_PROVIDER_SITE_OTHER): Payer: Medicare Other | Admitting: Internal Medicine

## 2014-01-11 ENCOUNTER — Encounter: Payer: Self-pay | Admitting: Internal Medicine

## 2014-01-11 VITALS — BP 135/80 | HR 76 | Temp 98.0°F | Wt 161.0 lb

## 2014-01-11 DIAGNOSIS — L03211 Cellulitis of face: Secondary | ICD-10-CM

## 2014-01-11 DIAGNOSIS — B029 Zoster without complications: Secondary | ICD-10-CM | POA: Diagnosis not present

## 2014-01-11 DIAGNOSIS — Z0000 Encounter for general adult medical examination without abnormal findings: Secondary | ICD-10-CM

## 2014-01-11 DIAGNOSIS — R7301 Impaired fasting glucose: Secondary | ICD-10-CM | POA: Diagnosis not present

## 2014-01-11 DIAGNOSIS — Z0189 Encounter for other specified special examinations: Secondary | ICD-10-CM

## 2014-01-11 DIAGNOSIS — E119 Type 2 diabetes mellitus without complications: Secondary | ICD-10-CM

## 2014-01-11 NOTE — Assessment & Plan Note (Signed)
Diet controlled,  Lab Results  Component Value Date   HGBA1C 6.4* 01/02/2014    to f/u any worsening symptoms or concerns, cont diet and wt control

## 2014-01-11 NOTE — Assessment & Plan Note (Signed)
Overall improved/resolved and does not appear to have PHN type pain at this time, to finish current meds,  to f/u any worsening symptoms or concerns

## 2014-01-11 NOTE — Progress Notes (Signed)
Subjective:    Patient ID: Misty [name], female    DOB: Apr 07, 1940, 73 y.o.   MRN: [id number]  HPI Here to f/u recent hospn, s/p IV antibx and antiviral for faciall cellulitis/shingles outbreak, overall much improved with near resolved swelling/erythema/tender with little to no PHN it seems.  No fever, o/w no new complaints.   Pt denies polydipsia, polyuria, or low sugar symptoms such as weakness or confusion improved with po intake.  Pt states overall good compliance with meds, trying to follow lower cholesterol, diabetic diet, wt overall stable. Pt denies chest pain, increased sob or doe, wheezing, orthopnea, PND, increased LE swelling, palpitations, dizziness or syncope. Past Medical History  Diagnosis Date  . Acute bronchitis 07/06/2008  . ALLERGIC RHINITIS 02/12/2008  . ANEMIA-IRON DEFICIENCY 02/12/2008  . DIABETES MELLITUS, TYPE II 02/12/2008  . FATIGUE 02/12/2008  . HYPERLIPIDEMIA 02/12/2008  . HYPERTENSION 11/05/2006  . KNEE PAIN, LEFT, ACUTE 05/11/2010  . LOW BACK PAIN 02/12/2008  . OSTEOPENIA 02/12/2008  . PEPTIC ULCER DISEASE 02/12/2008  . SINUSITIS- ACUTE-NOS 10/08/2008  . Facial cellulitis 01/01/2014  . Shingles outbreak 01/02/2014   Past Surgical History  Procedure Laterality Date  . Esophagogastroduodenoscopy  1970  . Appendectomy    . Abdominal hysterectomy      reports that she has quit smoking. She has never used smokeless tobacco. She reports that she drinks alcohol. She reports that she does not use illicit drugs. family history includes Colon polyps (age of onset: 66) in her brother; Coronary artery disease in her brother; Depression in her child; Hypertension in her brother; Mental illness in her other; Throat cancer in her other; Thyroid cancer in her brother. Allergies  Allergen Reactions  . Lipitor [Atorvastatin Calcium] Nausea Only  . Lovastatin Nausea Only  . Simvastatin Other (See Comments)    gi upset  . Tramadol Other (See Comments)    sleepiness    Current Outpatient Prescriptions on File Prior to Visit  Medication Sig Dispense Refill  . aspirin EC 81 MG tablet Take 81 mg by mouth daily with breakfast.      . clindamycin (CLEOCIN) 300 MG capsule Take 1 capsule (300 mg total) by mouth 3 (three) times daily. For 7 more days.  42 capsule  0  . oxyCODONE (OXY IR/ROXICODONE) 5 MG immediate release tablet Take 1 tablet (5 mg total) by mouth every 4 (four) hours as needed for moderate pain.  30 tablet  0  . tiZANidine (ZANAFLEX) 4 MG tablet Take 1 tablet (4 mg total) by mouth every 6 (six) hours as needed for muscle spasms.  40 tablet  2  . valACYclovir (VALTREX) 1000 MG tablet Take 1 tablet (1,000 mg total) by mouth 3 (three) times daily.  21 tablet  0  . valsartan-hydrochlorothiazide (DIOVAN-HCT) 320-25 MG per tablet Take 1 tablet by mouth daily with breakfast.       No current facility-administered medications on file prior to visit.   Review of Systems  Constitutional: Negative for unusual diaphoresis or other sweats  HENT: Negative for ringing in ear Eyes: Negative for double vision or worsening visual disturbance.  Respiratory: Negative for choking and stridor.   Gastrointestinal: Negative for vomiting or other signifcant bowel change Genitourinary: Negative for hematuria or decreased urine volume.  Musculoskeletal: Negative for other MSK pain or swelling Skin: Negative for color change and worsening wound.  Neurological: Negative for tremors and numbness other than noted  Psychiatric/Behavioral: Negative for decreased concentration or agitation other than above  Objective:   Physical Exam BP 135/80  Pulse 76  Temp(Src) 98 F (36.7 C)  Wt 161 lb (73.029 kg)  SpO2 96% VS noted,  Not ill Constitutional: Pt appears well-developed, well-nourished.  HENT: Head: NCAT.  Right Ear: External ear normal.  Left Ear: External ear normal.  Eyes: . Pupils are equal, round, and reactive to light. Conjunctivae and EOM are  normal Neck: Normal range of motion. Neck supple.  Cardiovascular: Normal rate and regular rhythm.   Pulmonary/Chest: Effort normal and breath sounds normal.  Neurological: Pt is alert. Not confused , motor grossly intact Skin: Skin is warm. Minor erythema only noted to left forehead, has scabbed lesion from recent cellulitis Psychiatric: Pt behavior is normal. No agitation.     Assessment & Plan:

## 2014-01-11 NOTE — Patient Instructions (Addendum)
Please continue all other medications as before, and refills have been done if requested.  Please have the pharmacy call with any other refills you may need.  Please continue your efforts at being more active, low cholesterol diet, and weight control.  You are otherwise up to date with prevention measures today.  Please keep your appointments with your specialists as you may have planned  Please return in 6 months, or sooner if needed, with Lab testing done 3-5 days before

## 2014-01-11 NOTE — Assessment & Plan Note (Signed)
Mild to mod, finished cleocin, o/w improved and stable,  to f/u any worsening symptoms or concerns

## 2014-01-11 NOTE — Progress Notes (Signed)
Pre visit review using our clinic review tool, if applicable. No additional management support is needed unless otherwise documented below in the visit note. 

## 2014-01-29 ENCOUNTER — Other Ambulatory Visit: Payer: Self-pay | Admitting: Internal Medicine

## 2014-01-29 ENCOUNTER — Other Ambulatory Visit: Payer: Self-pay

## 2014-01-29 MED ORDER — VALSARTAN-HYDROCHLOROTHIAZIDE 320-25 MG PO TABS: 1.0000 | ORAL_TABLET | Freq: Every day | ORAL | Status: DC

## 2014-05-30 ENCOUNTER — Ambulatory Visit: Payer: Medicare Other | Admitting: Internal Medicine

## 2014-07-16 ENCOUNTER — Ambulatory Visit: Payer: Medicare Other | Admitting: Internal Medicine

## 2014-07-18 ENCOUNTER — Ambulatory Visit (INDEPENDENT_AMBULATORY_CARE_PROVIDER_SITE_OTHER): Payer: Medicare Other | Admitting: Internal Medicine

## 2014-07-18 ENCOUNTER — Encounter: Payer: Self-pay | Admitting: Internal Medicine

## 2014-07-18 ENCOUNTER — Other Ambulatory Visit (INDEPENDENT_AMBULATORY_CARE_PROVIDER_SITE_OTHER): Payer: Medicare Other

## 2014-07-18 VITALS — BP 114/68 | HR 78 | Temp 98.4°F | Resp 18 | Ht 62.0 in | Wt 158.8 lb

## 2014-07-18 DIAGNOSIS — R7301 Impaired fasting glucose: Secondary | ICD-10-CM | POA: Diagnosis not present

## 2014-07-18 DIAGNOSIS — I10 Essential (primary) hypertension: Secondary | ICD-10-CM | POA: Diagnosis not present

## 2014-07-18 DIAGNOSIS — E119 Type 2 diabetes mellitus without complications: Secondary | ICD-10-CM

## 2014-07-18 DIAGNOSIS — Z0189 Encounter for other specified special examinations: Secondary | ICD-10-CM | POA: Diagnosis not present

## 2014-07-18 DIAGNOSIS — E785 Hyperlipidemia, unspecified: Secondary | ICD-10-CM | POA: Diagnosis not present

## 2014-07-18 DIAGNOSIS — Z0000 Encounter for general adult medical examination without abnormal findings: Secondary | ICD-10-CM

## 2014-07-18 LAB — HEPATIC FUNCTION PANEL
ALK PHOS: 104 U/L (ref 39–117)
ALT: 14 U/L (ref 0–35)
AST: 16 U/L (ref 0–37)
Albumin: 4 g/dL (ref 3.5–5.2)
BILIRUBIN DIRECT: 0.1 mg/dL (ref 0.0–0.3)
TOTAL PROTEIN: 8.3 g/dL (ref 6.0–8.3)
Total Bilirubin: 0.5 mg/dL (ref 0.2–1.2)

## 2014-07-18 LAB — URINALYSIS, ROUTINE W REFLEX MICROSCOPIC
BILIRUBIN URINE: NEGATIVE
Hgb urine dipstick: NEGATIVE
KETONES UR: NEGATIVE
Nitrite: NEGATIVE
PH: 6.5 (ref 5.0–8.0)
RBC / HPF: NONE SEEN (ref 0–?)
SPECIFIC GRAVITY, URINE: 1.01 (ref 1.000–1.030)
Total Protein, Urine: NEGATIVE
URINE GLUCOSE: NEGATIVE
Urobilinogen, UA: 0.2 (ref 0.0–1.0)

## 2014-07-18 LAB — CBC WITH DIFFERENTIAL/PLATELET
BASOS PCT: 0.5 % (ref 0.0–3.0)
Basophils Absolute: 0 10*3/uL (ref 0.0–0.1)
EOS PCT: 2.9 % (ref 0.0–5.0)
Eosinophils Absolute: 0.3 10*3/uL (ref 0.0–0.7)
HCT: 37.6 % (ref 36.0–46.0)
Hemoglobin: 12.4 g/dL (ref 12.0–15.0)
LYMPHS PCT: 30 % (ref 12.0–46.0)
Lymphs Abs: 2.7 10*3/uL (ref 0.7–4.0)
MCHC: 33.2 g/dL (ref 30.0–36.0)
MCV: 79.7 fl (ref 78.0–100.0)
MONO ABS: 0.9 10*3/uL (ref 0.1–1.0)
Monocytes Relative: 9.7 % (ref 3.0–12.0)
Neutro Abs: 5.2 10*3/uL (ref 1.4–7.7)
Neutrophils Relative %: 56.9 % (ref 43.0–77.0)
Platelets: 387 10*3/uL (ref 150.0–400.0)
RBC: 4.71 Mil/uL (ref 3.87–5.11)
RDW: 15.9 % — ABNORMAL HIGH (ref 11.5–15.5)
WBC: 9.1 10*3/uL (ref 4.0–10.5)

## 2014-07-18 LAB — TSH: TSH: 0.99 u[IU]/mL (ref 0.35–4.50)

## 2014-07-18 LAB — LIPID PANEL
Cholesterol: 226 mg/dL — ABNORMAL HIGH (ref 0–200)
HDL: 37.3 mg/dL — ABNORMAL LOW (ref >39.00)
LDL CALC: 152 mg/dL — AB (ref 0–99)
NonHDL: 188.7
TRIGLYCERIDES: 185 mg/dL — AB (ref 0.0–149.0)
Total CHOL/HDL Ratio: 6
VLDL: 37 mg/dL (ref 0.0–40.0)

## 2014-07-18 LAB — BASIC METABOLIC PANEL
BUN: 19 mg/dL (ref 6–23)
CO2: 30 mEq/L (ref 19–32)
Calcium: 9.8 mg/dL (ref 8.4–10.5)
Chloride: 97 mEq/L (ref 96–112)
Creatinine, Ser: 0.94 mg/dL (ref 0.40–1.20)
GFR: 61.84 mL/min (ref >60.00)
Glucose, Bld: 103 mg/dL — ABNORMAL HIGH (ref 70–99)
Potassium: 3.4 mEq/L — ABNORMAL LOW (ref 3.5–5.1)
Sodium: 134 mEq/L — ABNORMAL LOW (ref 135–145)

## 2014-07-18 LAB — MICROALBUMIN / CREATININE URINE RATIO
CREATININE, U: 71.9 mg/dL
Microalb Creat Ratio: 1 mg/g (ref 0.0–30.0)
Microalb, Ur: <0.7 mg/dL (ref 0.0–1.9)

## 2014-07-18 LAB — HEMOGLOBIN A1C: HEMOGLOBIN A1C: 6.4 % (ref 4.6–6.5)

## 2014-07-18 NOTE — Patient Instructions (Signed)

## 2014-07-18 NOTE — Progress Notes (Signed)
Subjective:    Patient ID: Misty [name], female    DOB: 22-Aug-1940, 74 y.o.   MRN: [id number]  HPI  Here for yearly f/u;  Overall doing ok;  Pt denies Chest pain, worsening SOB, DOE, wheezing, orthopnea, PND, worsening LE edema, palpitations, dizziness or syncope.  Pt denies neurological change such as new headache, facial or extremity weakness.  Pt denies polydipsia, polyuria, or low sugar symptoms. Pt states overall good compliance with treatment and medications, good tolerability, and has been trying to follow appropriate diet.  Pt denies worsening depressive symptoms, suicidal ideation or panic. No fever, night sweats, wt loss, loss of appetite, or other constitutional symptoms.  Pt states good ability with ADL's, has low fall risk, home safety reviewed and adequate, no other significant changes in hearing or vision, and only occasionally active with exercise. hospd x 5 days in oct 2015 for shingles. No currrent complaints Past Medical History  Diagnosis Date  . Acute bronchitis 07/06/2008  . ALLERGIC RHINITIS 02/12/2008  . ANEMIA-IRON DEFICIENCY 02/12/2008  . DIABETES MELLITUS, TYPE II 02/12/2008  . FATIGUE 02/12/2008  . HYPERLIPIDEMIA 02/12/2008  . HYPERTENSION 11/05/2006  . KNEE PAIN, LEFT, ACUTE 05/11/2010  . LOW BACK PAIN 02/12/2008  . OSTEOPENIA 02/12/2008  . PEPTIC ULCER DISEASE 02/12/2008  . SINUSITIS- ACUTE-NOS 10/08/2008  . Facial cellulitis 01/01/2014  . Shingles outbreak 01/02/2014   Past Surgical History  Procedure Laterality Date  . Esophagogastroduodenoscopy  1970  . Appendectomy    . Abdominal hysterectomy      reports that she has quit smoking. She has never used smokeless tobacco. She reports that she drinks alcohol. She reports that she does not use illicit drugs. family history includes Colon polyps (age of onset: 71) in her brother; Coronary artery disease in her brother; Depression in her child; Hypertension in her brother; Mental illness in her other; Throat  cancer in her other; Thyroid cancer in her brother. Allergies  Allergen Reactions  . Lipitor [Atorvastatin Calcium] Nausea Only  . Lovastatin Nausea Only  . Simvastatin Other (See Comments)    gi upset  . Tramadol Other (See Comments)    sleepiness   Current Outpatient Prescriptions on File Prior to Visit  Medication Sig Dispense Refill  . aspirin EC 81 MG tablet Take 81 mg by mouth daily with breakfast.    . clindamycin (CLEOCIN) 300 MG capsule Take 1 capsule (300 mg total) by mouth 3 (three) times daily. For 7 more days. 42 capsule 0  . oxyCODONE (OXY IR/ROXICODONE) 5 MG immediate release tablet Take 1 tablet (5 mg total) by mouth every 4 (four) hours as needed for moderate pain. 30 tablet 0  . tiZANidine (ZANAFLEX) 4 MG tablet Take 1 tablet (4 mg total) by mouth every 6 (six) hours as needed for muscle spasms. 40 tablet 2  . valACYclovir (VALTREX) 1000 MG tablet Take 1 tablet (1,000 mg total) by mouth 3 (three) times daily. 21 tablet 0  . valsartan-hydrochlorothiazide (DIOVAN-HCT) 320-25 MG per tablet Take 1 tablet by mouth daily with breakfast.    . valsartan-hydrochlorothiazide (DIOVAN-HCT) 320-25 MG per tablet Take 1 tablet by mouth daily. 90 tablet 3   No current facility-administered medications on file prior to visit.     Review of Systems Constitutional: Negative for increased diaphoresis, other activity, appetite or siginficant weight change other than noted HENT: Negative for worsening hearing loss, ear pain, facial swelling, mouth sores and neck stiffness.   Eyes: Negative for other worsening pain, redness or visual  disturbance.  Respiratory: Negative for shortness of breath and wheezing  Cardiovascular: Negative for chest pain and palpitations.  Gastrointestinal: Negative for diarrhea, blood in stool, abdominal distention or other pain Genitourinary: Negative for hematuria, flank pain or change in urine volume.  Musculoskeletal: Negative for myalgias or other joint  complaints.  Skin: Negative for color change and wound or drainage.  Neurological: Negative for syncope and numbness. other than noted Hematological: Negative for adenopathy. or other swelling Psychiatric/Behavioral: Negative for hallucinations, SI, self-injury, decreased concentration or other worsening agitation.      Objective:   Physical Exam BP 114/68 mmHg  Pulse 78  Temp(Src) 98.4 F (36.9 C) (Oral)  Resp 18  Ht '5\' 2"'$  (1.575 m)  Wt 158 lb 12.8 oz (72.031 kg)  BMI 29.04 kg/m2  SpO2 95% VS noted,  Constitutional: Pt is oriented to person, place, and time. Appears well-developed and well-nourished, in no significant distress Head: Normocephalic and atraumatic.  Right Ear: External ear normal.  Left Ear: External ear normal.  Nose: Nose normal.  Mouth/Throat: Oropharynx is clear and moist.  Eyes: Conjunctivae and EOM are normal. Pupils are equal, round, and reactive to light.  Neck: Normal range of motion. Neck supple. No JVD present. No tracheal deviation present or significant neck LA or mass Cardiovascular: Normal rate, regular rhythm, normal heart sounds and intact distal pulses.   Pulmonary/Chest: Effort normal and breath sounds without rales or wheezing  Abdominal: Soft. Bowel sounds are normal. NT. No HSM  Musculoskeletal: Normal range of motion. Exhibits no edema.  Lymphadenopathy:  Has no cervical adenopathy.  Neurological: Pt is alert and oriented to person, place, and time. Pt has normal reflexes. No cranial nerve deficit. Motor grossly intact Skin: Skin is warm and dry. No rash noted.  Psychiatric:  Has normal mood and affect. Behavior is normal.      Assessment & Plan:

## 2014-07-18 NOTE — Progress Notes (Signed)
Pre visit review using our clinic review tool, if applicable. No additional management support is needed unless otherwise documented below in the visit note. 

## 2015-01-17 ENCOUNTER — Ambulatory Visit: Payer: Medicare Other | Admitting: Internal Medicine

## 2015-02-05 ENCOUNTER — Other Ambulatory Visit: Payer: Self-pay | Admitting: Internal Medicine

## 2015-05-23 ENCOUNTER — Encounter: Payer: Self-pay | Admitting: Gastroenterology

## 2015-07-02 ENCOUNTER — Encounter: Payer: Self-pay | Admitting: Internal Medicine

## 2015-07-02 DIAGNOSIS — R262 Difficulty in walking, not elsewhere classified: Secondary | ICD-10-CM | POA: Diagnosis not present

## 2015-07-02 DIAGNOSIS — M170 Bilateral primary osteoarthritis of knee: Secondary | ICD-10-CM | POA: Diagnosis not present

## 2015-07-02 DIAGNOSIS — M25562 Pain in left knee: Secondary | ICD-10-CM | POA: Diagnosis not present

## 2015-07-02 DIAGNOSIS — M25561 Pain in right knee: Secondary | ICD-10-CM | POA: Diagnosis not present

## 2015-07-10 DIAGNOSIS — R269 Unspecified abnormalities of gait and mobility: Secondary | ICD-10-CM | POA: Diagnosis not present

## 2015-07-10 DIAGNOSIS — M25562 Pain in left knee: Secondary | ICD-10-CM | POA: Diagnosis not present

## 2015-07-10 DIAGNOSIS — M25561 Pain in right knee: Secondary | ICD-10-CM | POA: Diagnosis not present

## 2015-07-10 DIAGNOSIS — M1712 Unilateral primary osteoarthritis, left knee: Secondary | ICD-10-CM | POA: Diagnosis not present

## 2015-07-10 DIAGNOSIS — M170 Bilateral primary osteoarthritis of knee: Secondary | ICD-10-CM | POA: Diagnosis not present

## 2015-07-17 DIAGNOSIS — M1711 Unilateral primary osteoarthritis, right knee: Secondary | ICD-10-CM | POA: Diagnosis not present

## 2015-07-17 DIAGNOSIS — M25561 Pain in right knee: Secondary | ICD-10-CM | POA: Diagnosis not present

## 2015-07-24 DIAGNOSIS — M25562 Pain in left knee: Secondary | ICD-10-CM | POA: Diagnosis not present

## 2015-07-24 DIAGNOSIS — M1712 Unilateral primary osteoarthritis, left knee: Secondary | ICD-10-CM | POA: Diagnosis not present

## 2015-07-24 DIAGNOSIS — M25561 Pain in right knee: Secondary | ICD-10-CM | POA: Diagnosis not present

## 2015-07-24 DIAGNOSIS — R269 Unspecified abnormalities of gait and mobility: Secondary | ICD-10-CM | POA: Diagnosis not present

## 2015-07-24 DIAGNOSIS — M170 Bilateral primary osteoarthritis of knee: Secondary | ICD-10-CM | POA: Diagnosis not present

## 2015-07-24 DIAGNOSIS — R2689 Other abnormalities of gait and mobility: Secondary | ICD-10-CM | POA: Diagnosis not present

## 2015-07-29 DIAGNOSIS — M25561 Pain in right knee: Secondary | ICD-10-CM | POA: Diagnosis not present

## 2015-07-29 DIAGNOSIS — M25562 Pain in left knee: Secondary | ICD-10-CM | POA: Diagnosis not present

## 2015-07-29 DIAGNOSIS — R269 Unspecified abnormalities of gait and mobility: Secondary | ICD-10-CM | POA: Diagnosis not present

## 2015-07-29 DIAGNOSIS — M1711 Unilateral primary osteoarthritis, right knee: Secondary | ICD-10-CM | POA: Diagnosis not present

## 2015-07-29 DIAGNOSIS — M170 Bilateral primary osteoarthritis of knee: Secondary | ICD-10-CM | POA: Diagnosis not present

## 2015-08-05 DIAGNOSIS — M1712 Unilateral primary osteoarthritis, left knee: Secondary | ICD-10-CM | POA: Diagnosis not present

## 2015-08-05 DIAGNOSIS — M170 Bilateral primary osteoarthritis of knee: Secondary | ICD-10-CM | POA: Diagnosis not present

## 2015-08-05 DIAGNOSIS — R2689 Other abnormalities of gait and mobility: Secondary | ICD-10-CM | POA: Diagnosis not present

## 2015-08-05 DIAGNOSIS — M25562 Pain in left knee: Secondary | ICD-10-CM | POA: Diagnosis not present

## 2015-08-05 DIAGNOSIS — M25561 Pain in right knee: Secondary | ICD-10-CM | POA: Diagnosis not present

## 2015-08-12 DIAGNOSIS — M170 Bilateral primary osteoarthritis of knee: Secondary | ICD-10-CM | POA: Diagnosis not present

## 2015-08-12 DIAGNOSIS — M25561 Pain in right knee: Secondary | ICD-10-CM | POA: Diagnosis not present

## 2015-08-12 DIAGNOSIS — R2689 Other abnormalities of gait and mobility: Secondary | ICD-10-CM | POA: Diagnosis not present

## 2015-08-12 DIAGNOSIS — M1711 Unilateral primary osteoarthritis, right knee: Secondary | ICD-10-CM | POA: Diagnosis not present

## 2015-08-12 DIAGNOSIS — M25562 Pain in left knee: Secondary | ICD-10-CM | POA: Diagnosis not present

## 2015-08-18 DIAGNOSIS — R2689 Other abnormalities of gait and mobility: Secondary | ICD-10-CM | POA: Diagnosis not present

## 2015-08-18 DIAGNOSIS — M25561 Pain in right knee: Secondary | ICD-10-CM | POA: Diagnosis not present

## 2015-08-18 DIAGNOSIS — M25562 Pain in left knee: Secondary | ICD-10-CM | POA: Diagnosis not present

## 2015-08-18 DIAGNOSIS — M170 Bilateral primary osteoarthritis of knee: Secondary | ICD-10-CM | POA: Diagnosis not present

## 2015-08-21 ENCOUNTER — Telehealth: Payer: Self-pay | Admitting: Internal Medicine

## 2015-08-21 DIAGNOSIS — Z860100 Personal history of colon polyps, unspecified: Secondary | ICD-10-CM

## 2015-08-21 NOTE — Telephone Encounter (Signed)
This has been done.

## 2015-08-21 NOTE — Telephone Encounter (Signed)
Patient is requesting referral to GI for routine colonoscopy.

## 2015-09-12 ENCOUNTER — Encounter: Payer: Self-pay | Admitting: Internal Medicine

## 2015-09-12 ENCOUNTER — Ambulatory Visit (INDEPENDENT_AMBULATORY_CARE_PROVIDER_SITE_OTHER): Payer: Medicare Other | Admitting: Internal Medicine

## 2015-09-12 ENCOUNTER — Other Ambulatory Visit (INDEPENDENT_AMBULATORY_CARE_PROVIDER_SITE_OTHER): Payer: Medicare Other

## 2015-09-12 VITALS — BP 120/68 | HR 84 | Temp 98.7°F | Resp 20 | Wt 157.0 lb

## 2015-09-12 DIAGNOSIS — K5900 Constipation, unspecified: Secondary | ICD-10-CM

## 2015-09-12 DIAGNOSIS — E785 Hyperlipidemia, unspecified: Secondary | ICD-10-CM

## 2015-09-12 DIAGNOSIS — E119 Type 2 diabetes mellitus without complications: Secondary | ICD-10-CM

## 2015-09-12 DIAGNOSIS — I10 Essential (primary) hypertension: Secondary | ICD-10-CM

## 2015-09-12 LAB — URINALYSIS, ROUTINE W REFLEX MICROSCOPIC
BILIRUBIN URINE: NEGATIVE
Hgb urine dipstick: NEGATIVE
KETONES UR: NEGATIVE
Leukocytes, UA: NEGATIVE
NITRITE: NEGATIVE
PH: 6 (ref 5.0–8.0)
Specific Gravity, Urine: 1.025 (ref 1.000–1.030)
TOTAL PROTEIN, URINE-UPE24: NEGATIVE
Urine Glucose: NEGATIVE
Urobilinogen, UA: 0.2 (ref 0.0–1.0)

## 2015-09-12 LAB — HEMOGLOBIN A1C: HEMOGLOBIN A1C: 6.2 % (ref 4.6–6.5)

## 2015-09-12 LAB — BASIC METABOLIC PANEL
BUN: 23 mg/dL (ref 6–23)
CHLORIDE: 100 meq/L (ref 96–112)
CO2: 31 meq/L (ref 19–32)
Calcium: 9.9 mg/dL (ref 8.4–10.5)
Creatinine, Ser: 0.88 mg/dL (ref 0.40–1.20)
GFR: 66.52 mL/min (ref >60.00)
GLUCOSE: 92 mg/dL (ref 70–99)
POTASSIUM: 3.8 meq/L (ref 3.5–5.1)
SODIUM: 138 meq/L (ref 135–145)

## 2015-09-12 LAB — HEPATIC FUNCTION PANEL
ALK PHOS: 97 U/L (ref 39–117)
ALT: 11 U/L (ref 0–35)
AST: 14 U/L (ref 0–37)
Albumin: 3.9 g/dL (ref 3.5–5.2)
BILIRUBIN DIRECT: 0 mg/dL (ref 0.0–0.3)
TOTAL PROTEIN: 8 g/dL (ref 6.0–8.3)
Total Bilirubin: 0.4 mg/dL (ref 0.2–1.2)

## 2015-09-12 LAB — CBC WITH DIFFERENTIAL/PLATELET
BASOS PCT: 0.3 % (ref 0.0–3.0)
Basophils Absolute: 0 10*3/uL (ref 0.0–0.1)
EOS ABS: 0.2 10*3/uL (ref 0.0–0.7)
EOS PCT: 2.3 % (ref 0.0–5.0)
HEMATOCRIT: 36.5 % (ref 36.0–46.0)
HEMOGLOBIN: 12.2 g/dL (ref 12.0–15.0)
LYMPHS PCT: 28.8 % (ref 12.0–46.0)
Lymphs Abs: 2.7 10*3/uL (ref 0.7–4.0)
MCHC: 33.3 g/dL (ref 30.0–36.0)
MCV: 81.8 fl (ref 78.0–100.0)
MONOS PCT: 10 % (ref 3.0–12.0)
Monocytes Absolute: 0.9 10*3/uL (ref 0.1–1.0)
Neutro Abs: 5.4 10*3/uL (ref 1.4–7.7)
Neutrophils Relative %: 58.6 % (ref 43.0–77.0)
Platelets: 316 10*3/uL (ref 150.0–400.0)
RBC: 4.46 Mil/uL (ref 3.87–5.11)
RDW: 15.3 % (ref 11.5–15.5)
WBC: 9.3 10*3/uL (ref 4.0–10.5)

## 2015-09-12 LAB — MICROALBUMIN / CREATININE URINE RATIO
Creatinine,U: 169.1 mg/dL
MICROALB UR: 1.1 mg/dL (ref 0.0–1.9)
MICROALB/CREAT RATIO: 0.7 mg/g (ref 0.0–30.0)

## 2015-09-12 LAB — LIPID PANEL
Cholesterol: 229 mg/dL — ABNORMAL HIGH (ref 0–200)
HDL: 35.4 mg/dL — ABNORMAL LOW (ref >39.00)
NONHDL: 193.97
Total CHOL/HDL Ratio: 6
Triglycerides: 210 mg/dL — ABNORMAL HIGH (ref 0.0–149.0)
VLDL: 42 mg/dL — ABNORMAL HIGH (ref 0.0–40.0)

## 2015-09-12 LAB — TSH: TSH: 0.92 u[IU]/mL (ref 0.35–4.50)

## 2015-09-12 LAB — LDL CHOLESTEROL, DIRECT: Direct LDL: 165 mg/dL

## 2015-09-12 MED ORDER — VALSARTAN-HYDROCHLOROTHIAZIDE 320-25 MG PO TABS: 1.0000 | ORAL_TABLET | Freq: Every day | ORAL | Status: DC

## 2015-09-12 NOTE — Progress Notes (Signed)
Pre visit review using our clinic review tool, if applicable. No additional management support is needed unless otherwise documented below in the visit note. 

## 2015-09-12 NOTE — Patient Instructions (Signed)
You should be signed up for the Cologuard at your visit today  OK to take the Miralax OTC every day (also senakot 1 at night can be helpful as well)  Please continue all other medications as before, and refills have been done if requested.  Please have the pharmacy call with any other refills you may need.  Please continue your efforts at being more active, low cholesterol diet, and weight control.  You are otherwise up to date with prevention measures today.  Please keep your appointments with your specialists as you may have planned  Please go to the LAB in the Basement (turn left off the elevator) for the tests to be done today  You will be contacted by phone if any changes need to be made immediately.  Otherwise, you will receive a letter about your results with an explanation, but please check with MyChart first.  Please remember to sign up for MyChart if you have not done so, as this will be important to you in the future with finding out test results, communicating by private email, and scheduling acute appointments online when needed.  Please return in 1 year for your yearly visit, or sooner if needed

## 2015-09-12 NOTE — Assessment & Plan Note (Signed)
stable overall by history and exam, recent data reviewed with pt, and pt to continue medical treatment as before,  to f/u any worsening symptoms or concerns BP Readings from Last 3 Encounters:  09/12/15 120/68  07/18/14 114/68  01/11/14 135/80

## 2015-09-12 NOTE — Assessment & Plan Note (Signed)
Mild, etiology unclear, exam benign, for miralax daily otc,  to f/u any worsening symptoms or concerns

## 2015-09-12 NOTE — Progress Notes (Signed)
Subjective:    Patient ID: Misty [name], female    DOB: Apr 04, 1940, 75 y.o.   MRN: [id number]  HPI  Here for yearly f/u;  Overall doing ok;  Pt denies Chest pain, worsening SOB, DOE, wheezing, orthopnea, PND, worsening LE edema, palpitations, dizziness or syncope.  Pt denies neurological change such as new headache, facial or extremity weakness.  Pt denies polydipsia, polyuria, or low sugar symptoms. Pt states overall good compliance with treatment and medications, good tolerability, and has been trying to follow appropriate diet.  Pt denies worsening depressive symptoms, suicidal ideation or panic. No fever, night sweats, wt loss, loss of appetite, or other constitutional symptoms.  Pt states good ability with ADL's, has low fall risk, home safety reviewed and adequate, no other significant changes in hearing or vision, and only occasionally active with exercise. Asks for cologuard instead of f/u colonoscopy.  Flexogenics shots seemed to help the knees. Daughter died 06-Mar-2024her children not doing well, more stress for her.  Cont's to believe she is statin intolerant.Denies worsening reflux, abd pain, dysphagia, n/v, bowel change or blood, except for worsening 1 mo constipation. Past Medical History  Diagnosis Date  . Acute bronchitis 07/06/2008  . ALLERGIC RHINITIS 02/12/2008  . ANEMIA-IRON DEFICIENCY 02/12/2008  . DIABETES MELLITUS, TYPE II 02/12/2008  . FATIGUE 02/12/2008  . HYPERLIPIDEMIA 02/12/2008  . HYPERTENSION 11/05/2006  . KNEE PAIN, LEFT, ACUTE 05/11/2010  . LOW BACK PAIN 02/12/2008  . OSTEOPENIA 02/12/2008  . PEPTIC ULCER DISEASE 02/12/2008  . SINUSITIS- ACUTE-NOS 10/08/2008  . Facial cellulitis 01/01/2014  . Shingles outbreak 01/02/2014   Past Surgical History  Procedure Laterality Date  . Esophagogastroduodenoscopy  1970  . Appendectomy    . Abdominal hysterectomy      reports that she has quit smoking. She has never used smokeless tobacco. She reports that she drinks  alcohol. She reports that she does not use illicit drugs. family history includes Colon polyps (age of onset: 20) in her brother; Coronary artery disease in her brother; Depression in her child; Hypertension in her brother; Mental illness in her other; Throat cancer in her other; Thyroid cancer in her brother. Allergies  Allergen Reactions  . Lipitor [Atorvastatin Calcium] Nausea Only  . Lovastatin Nausea Only  . Simvastatin Other (See Comments)    gi upset  . Tramadol Other (See Comments)    sleepiness   Current Outpatient Prescriptions on File Prior to Visit  Medication Sig Dispense Refill  . aspirin EC 81 MG tablet Take 81 mg by mouth daily with breakfast.    . valsartan-hydrochlorothiazide (DIOVAN-HCT) 320-25 MG per tablet Take 1 tablet by mouth daily. 90 tablet 3  . valsartan-hydrochlorothiazide (DIOVAN-HCT) 320-25 MG tablet TAKE 1 TABLET BY MOUTH DAILY 90 tablet 1   No current facility-administered medications on file prior to visit.   Review of Systems Constitutional: Negative for increased diaphoresis, or other activity, appetite or siginficant weight change other than noted HENT: Negative for worsening hearing loss, ear pain, facial swelling, mouth sores and neck stiffness.   Eyes: Negative for other worsening pain, redness or visual disturbance.  Respiratory: Negative for choking or stridor Cardiovascular: Negative for other chest pain and palpitations.  Gastrointestinal: Negative for worsening diarrhea, blood in stool, or abdominal distention Genitourinary: Negative for hematuria, flank pain or change in urine volume.  Musculoskeletal: Negative for myalgias or other joint complaints.  Skin: Negative for other color change and wound or drainage.  Neurological: Negative for syncope and numbness. other than  noted Hematological: Negative for adenopathy. or other swelling Psychiatric/Behavioral: Negative for hallucinations, SI, self-injury, decreased concentration or other  worsening agitation.      Objective:   Physical Exam BP 120/68 mmHg  Pulse 84  Temp(Src) 98.7 F (37.1 C) (Oral)  Resp 20  Wt 157 lb (71.215 kg)  SpO2 96% VS noted,  Constitutional: Pt is oriented to person, place, and time. Appears well-developed and well-nourished, in no significant distress Head: Normocephalic and atraumatic  Eyes: Conjunctivae and EOM are normal. Pupils are equal, round, and reactive to light Right Ear: External ear normal.  Left Ear: External ear normal Nose: Nose normal.  Mouth/Throat: Oropharynx is clear and moist  Neck: Normal range of motion. Neck supple. No JVD present. No tracheal deviation present or significant neck LA or mass Cardiovascular: Normal rate, regular rhythm, normal heart sounds and intact distal pulses.   Pulmonary/Chest: Effort normal and breath sounds without rales or wheezing  Abdominal: Soft. Bowel sounds are normal. NT. No HSM  Musculoskeletal: Normal range of motion. Exhibits no edema Lymphadenopathy: Has no cervical adenopathy.  Neurological: Pt is alert and oriented to person, place, and time. Pt has normal reflexes. No cranial nerve deficit. Motor grossly intact Skin: Skin is warm and dry. No rash noted or new ulcers Psychiatric:  Has nervous dysphoric mood and affect. Behavior is normal.      Assessment & Plan:

## 2015-09-12 NOTE — Assessment & Plan Note (Signed)
stable overall by history and exam, recent data reviewed with pt, and pt to continue medical treatment as before,  to f/u any worsening symptoms or concerns Lab Results  Component Value Date   LDLCALC 152* 07/18/2014   Statin intolerant, declines other trial

## 2015-09-12 NOTE — Assessment & Plan Note (Signed)
stable overall by history and exam, recent data reviewed with pt, and pt to continue medical treatment as before,  to f/u any worsening symptoms or concerns Lab Results  Component Value Date   HGBA1C 6.4 07/18/2014

## 2015-09-17 DIAGNOSIS — Z1211 Encounter for screening for malignant neoplasm of colon: Secondary | ICD-10-CM | POA: Diagnosis not present

## 2015-09-17 DIAGNOSIS — Z1212 Encounter for screening for malignant neoplasm of rectum: Secondary | ICD-10-CM | POA: Diagnosis not present

## 2015-09-18 LAB — COLOGUARD: Cologuard: NEGATIVE

## 2015-09-29 ENCOUNTER — Other Ambulatory Visit: Payer: Self-pay | Admitting: *Deleted

## 2015-09-29 MED ORDER — VALSARTAN-HYDROCHLOROTHIAZIDE 320-25 MG PO TABS: 1.0000 | ORAL_TABLET | Freq: Every day | ORAL | Status: DC

## 2015-09-29 NOTE — Telephone Encounter (Signed)
Pt walk in needing to get a week supply on her BP med [facility]. Pt had a balance w/[employer] per letter that she had once the balance has been paid they will mail her medications. Pt states she had paid balance today, and was told to contact MD office for 7 day supply. Inform pt [facility].../lmb

## 2015-10-06 ENCOUNTER — Telehealth: Payer: Self-pay | Admitting: Gastroenterology

## 2015-10-06 ENCOUNTER — Telehealth: Payer: Self-pay

## 2015-10-06 NOTE — Telephone Encounter (Signed)
I have not encountered this type of insurance. If she does need the colonoscopy, should I bring her in to be seen and address the hospitalization issue?

## 2015-10-06 NOTE — Telephone Encounter (Signed)
Not sure why its a requirement for her to be hospitalized for 3 days in order to have a colonoscopy, can someone from our office check with her insurance. Thanks

## 2015-10-06 NOTE — Telephone Encounter (Signed)
Patient aware.

## 2015-10-06 NOTE — Telephone Encounter (Signed)
valsartan-hydrochlorothiazide  Patient is requesting a refill on this medication.

## 2015-10-07 MED ORDER — VALSARTAN-HYDROCHLOROTHIAZIDE 320-25 MG PO TABS: 1.0000 | ORAL_TABLET | Freq: Every day | ORAL | Status: DC

## 2015-10-07 NOTE — Telephone Encounter (Signed)
[facility] day to [facility] until pt receive mail order...Misty [name]

## 2015-10-20 ENCOUNTER — Encounter: Payer: Self-pay | Admitting: Internal Medicine

## 2015-11-06 DIAGNOSIS — R262 Difficulty in walking, not elsewhere classified: Secondary | ICD-10-CM | POA: Diagnosis not present

## 2015-11-06 DIAGNOSIS — M170 Bilateral primary osteoarthritis of knee: Secondary | ICD-10-CM | POA: Diagnosis not present

## 2015-11-06 DIAGNOSIS — M25561 Pain in right knee: Secondary | ICD-10-CM | POA: Diagnosis not present

## 2015-11-06 DIAGNOSIS — M25562 Pain in left knee: Secondary | ICD-10-CM | POA: Diagnosis not present

## 2015-11-13 ENCOUNTER — Encounter: Payer: Self-pay | Admitting: Internal Medicine

## 2015-11-26 IMAGING — CT CT MAXILLOFACIAL W/ CM
1 series · 15 of 30 positions shown, 19 images · IV contrast (omnipaque)
Comparison: None.

CLINICAL DATA: Acute onset of swelling above the left eyebrow.
Initial encounter.

EXAM:
CT MAXILLOFACIAL WITH CONTRAST
TECHNIQUE: Multidetector CT imaging of the maxillofacial structures was
performed with intravenous contrast. Multiplanar CT image
reconstructions were also generated. A small metallic BB was placed
on the right temple in order to reliably differentiate right from
left.
CONTRAST:  80mL OMNIPAQUE IOHEXOL 300 MG/ML  SOLN

[Series 3: facial st · axial · 0.34mm/px · z∈[-342,-190]mm · 15 of 82 slices shown, 19 images]
[im 3/82  brain]
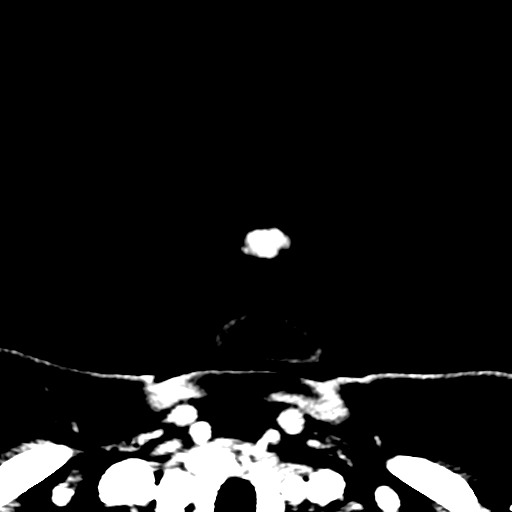
[im 3/82  bone]
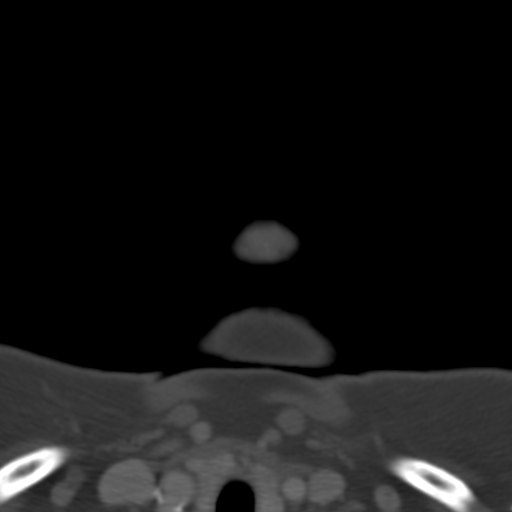
[im 9/82  bone]
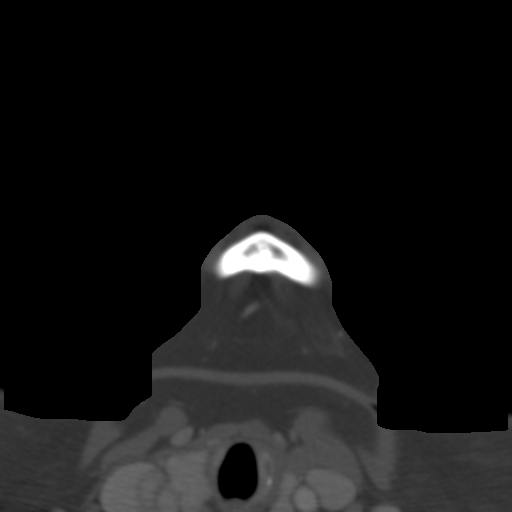
[im 14/82  bone]
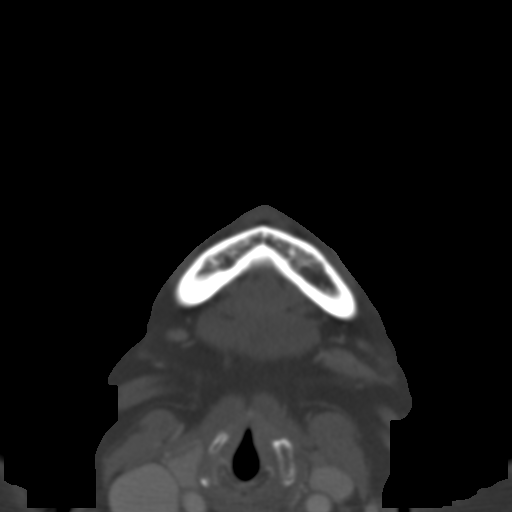
[im 20/82  bone]
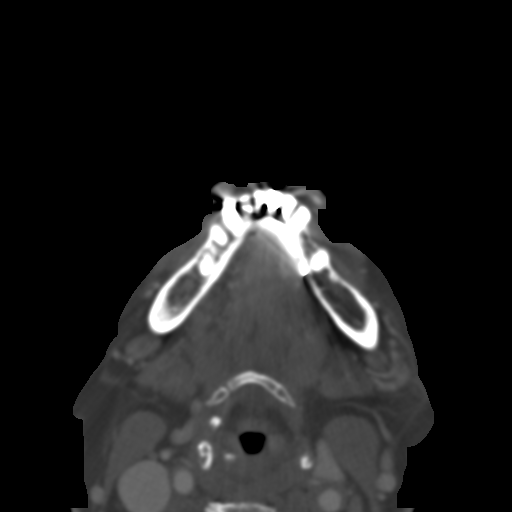
[im 26/82  brain]
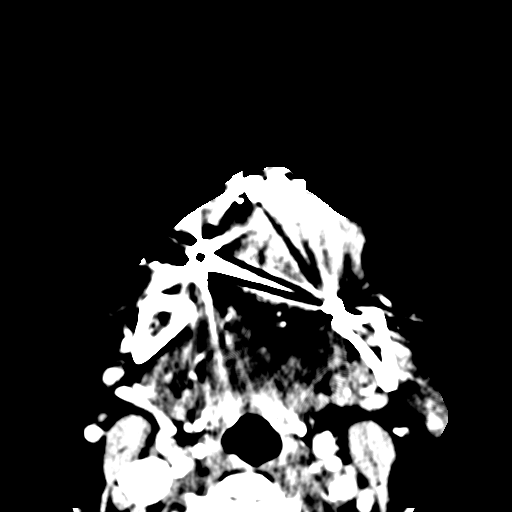
[im 26/82  bone]
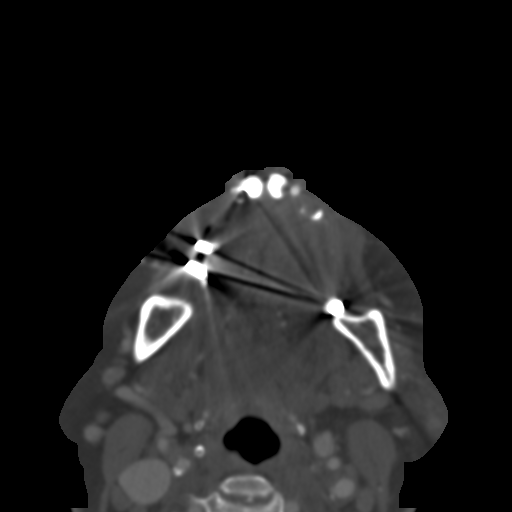
[im 31/82  bone]
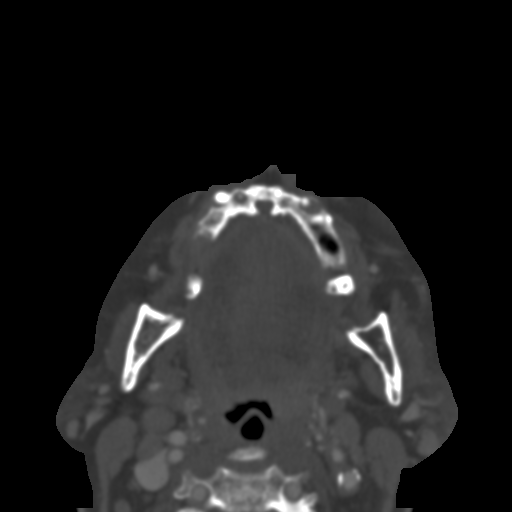
[im 37/82  bone]
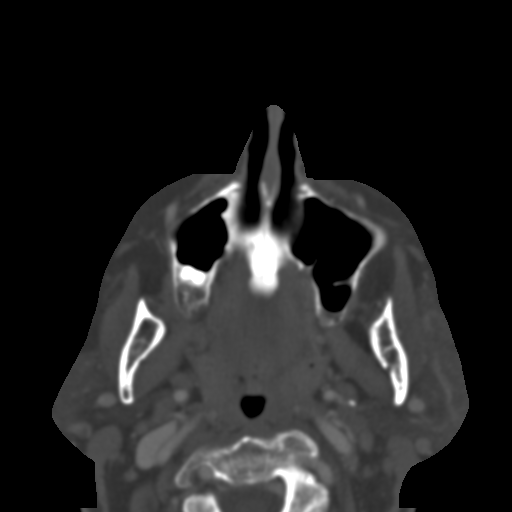
[im 42/82  bone]
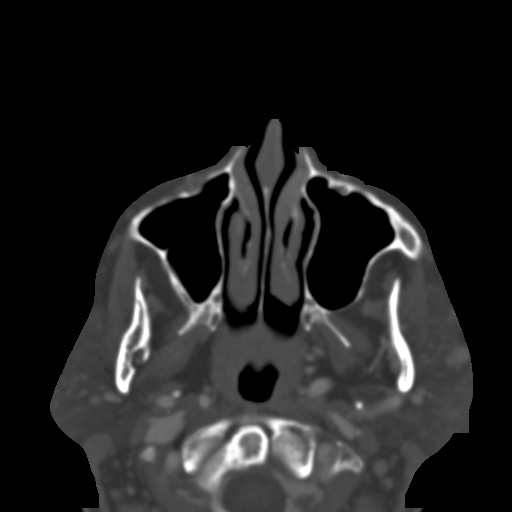
[im 45/82  brain]
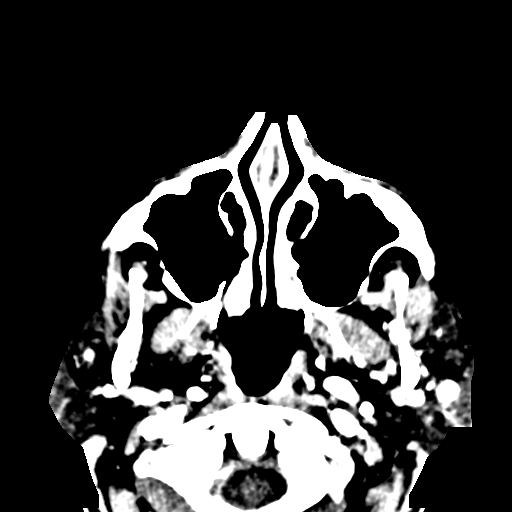
[im 45/82  bone]
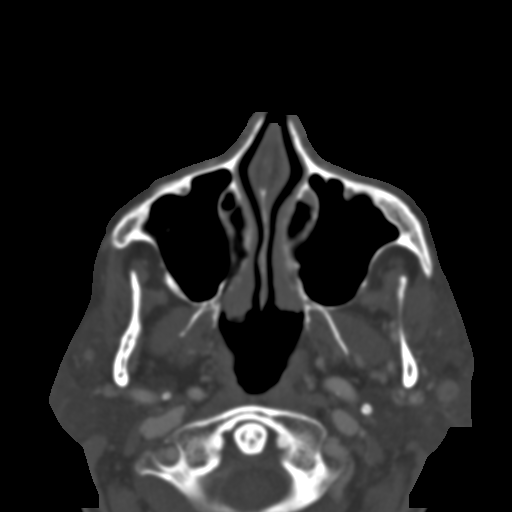
[im 51/82  bone]
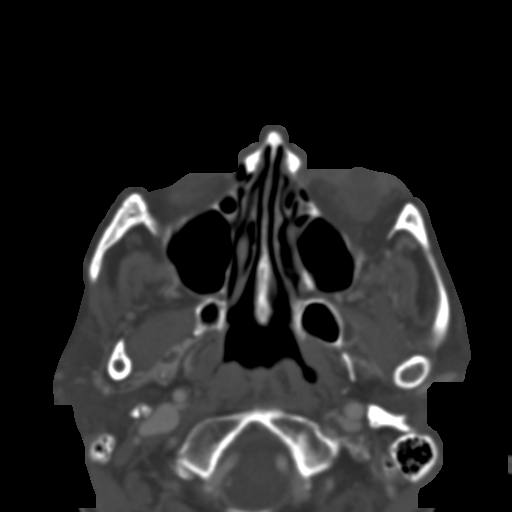
[im 56/82  bone]
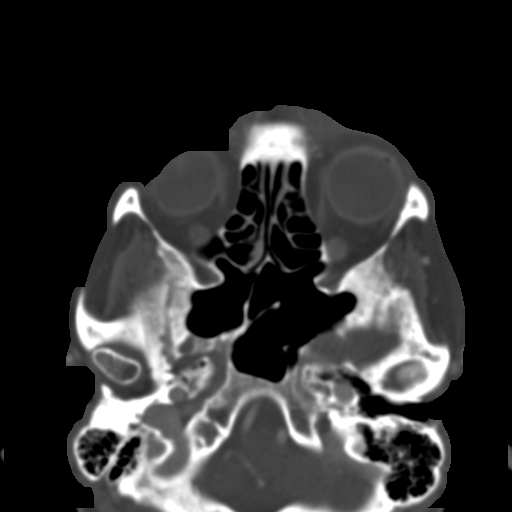
[im 62/82  bone]
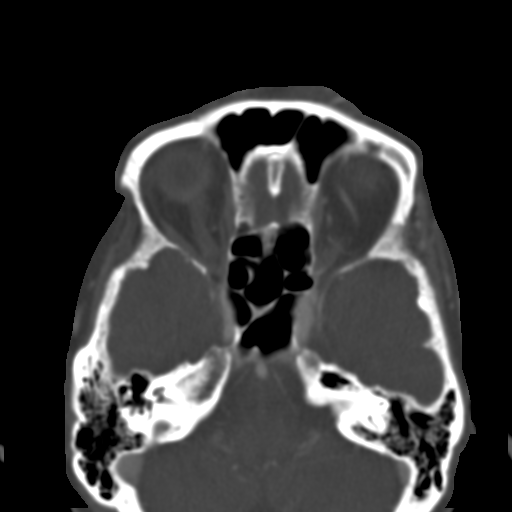
[im 68/82  brain]
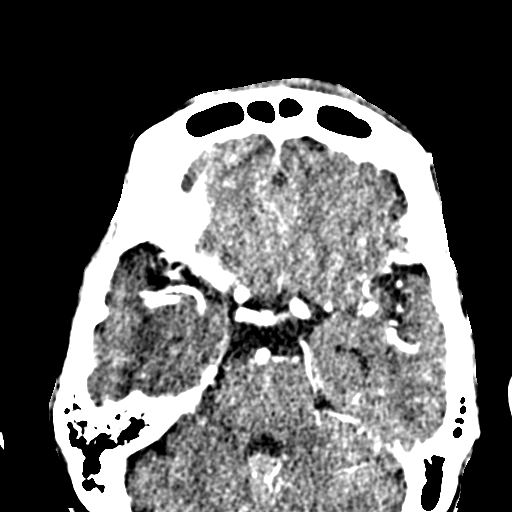
[im 68/82  bone]
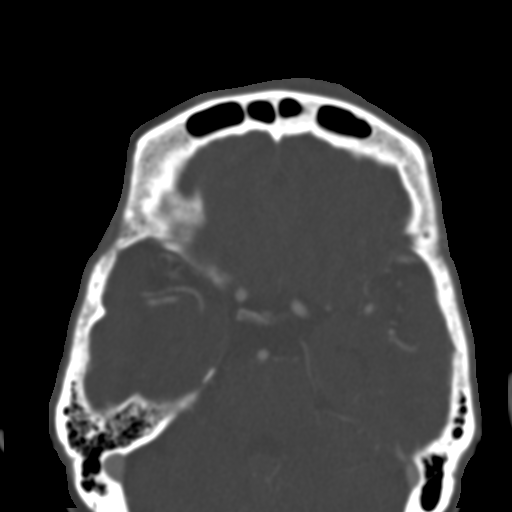
[im 73/82  bone]
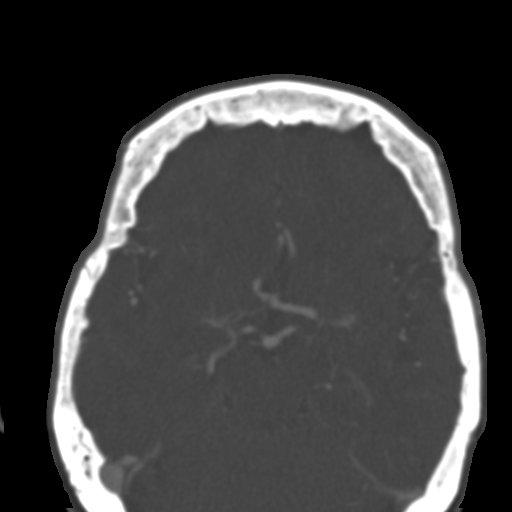
[im 79/82  bone]
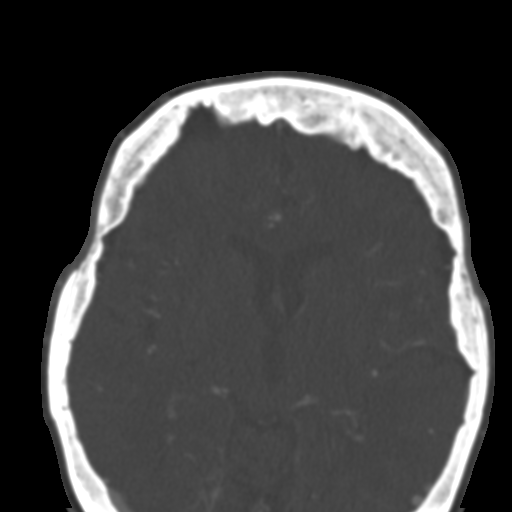

[15 of 30 positions shown; findings below may reference images not displayed]

FINDINGS: There is no evidence of fracture or dislocation. The maxilla and
mandible appear intact. The nasal bone is unremarkable in
appearance. There is partial absence of multiple maxillary and
mandibular teeth, with associated periapical abscesses.

The orbits are intact bilaterally. The visualized paranasal sinuses
and mastoid air cells are well-aerated.

Soft tissue swelling is noted about the left orbit and extending
over the left frontal calvarium, with associated skin thickening.
This raises concern for cellulitis. No underlying abscess is seen.
There is no evidence of vascular compromise.

The parapharyngeal fat planes are preserved. The nasopharynx,
oropharynx and hypopharynx are unremarkable in appearance. The
visualized portions of the valleculae and piriform sinuses are
grossly unremarkable.

The parotid and submandibular glands are within normal limits. No
cervical lymphadenopathy is seen. The visualized portions of the
brain are unremarkable in appearance. No abnormal focal contrast
enhancement is seen.
IMPRESSION: 1. Soft tissue swelling about the left orbit and overlying the left
frontal calvarium, with associated skin thickening. This raises
concern for cellulitis. No evidence of abscess.
2. The orbits remain intact bilaterally.
3. Partial absence of multiple maxillary and mandibular teeth, with
associated periapical abscesses.

## 2016-02-02 ENCOUNTER — Telehealth: Payer: Self-pay | Admitting: Internal Medicine

## 2016-02-02 NOTE — Telephone Encounter (Signed)
Called Ms Auerbach to schedule awv. Patient stated she will have to call office back to schedule awv appt.

## 2016-03-08 DIAGNOSIS — M25562 Pain in left knee: Secondary | ICD-10-CM | POA: Diagnosis not present

## 2016-03-08 DIAGNOSIS — M170 Bilateral primary osteoarthritis of knee: Secondary | ICD-10-CM | POA: Diagnosis not present

## 2016-03-08 DIAGNOSIS — M25561 Pain in right knee: Secondary | ICD-10-CM | POA: Diagnosis not present

## 2016-03-09 ENCOUNTER — Encounter: Payer: Self-pay | Admitting: Internal Medicine

## 2016-03-09 ENCOUNTER — Ambulatory Visit (INDEPENDENT_AMBULATORY_CARE_PROVIDER_SITE_OTHER): Payer: Medicare Other | Admitting: Internal Medicine

## 2016-03-09 ENCOUNTER — Other Ambulatory Visit (INDEPENDENT_AMBULATORY_CARE_PROVIDER_SITE_OTHER): Payer: Medicare Other

## 2016-03-09 VITALS — BP 120/76 | HR 99 | Temp 98.1°F | Resp 20 | Wt 154.0 lb

## 2016-03-09 DIAGNOSIS — E119 Type 2 diabetes mellitus without complications: Secondary | ICD-10-CM

## 2016-03-09 DIAGNOSIS — E785 Hyperlipidemia, unspecified: Secondary | ICD-10-CM | POA: Diagnosis not present

## 2016-03-09 DIAGNOSIS — N6320 Unspecified lump in the left breast, unspecified quadrant: Secondary | ICD-10-CM | POA: Diagnosis not present

## 2016-03-09 DIAGNOSIS — I10 Essential (primary) hypertension: Secondary | ICD-10-CM

## 2016-03-09 DIAGNOSIS — I83819 Varicose veins of unspecified lower extremities with pain: Secondary | ICD-10-CM

## 2016-03-09 DIAGNOSIS — N63 Unspecified lump in unspecified breast: Secondary | ICD-10-CM

## 2016-03-09 DIAGNOSIS — C50912 Malignant neoplasm of unspecified site of left female breast: Secondary | ICD-10-CM

## 2016-03-09 LAB — HEPATIC FUNCTION PANEL
ALBUMIN: 4.1 g/dL (ref 3.5–5.2)
ALK PHOS: 95 U/L (ref 39–117)
ALT: 12 U/L (ref 0–35)
AST: 15 U/L (ref 0–37)
Bilirubin, Direct: 0.1 mg/dL (ref 0.0–0.3)
TOTAL PROTEIN: 7.8 g/dL (ref 6.0–8.3)
Total Bilirubin: 0.3 mg/dL (ref 0.2–1.2)

## 2016-03-09 LAB — LIPID PANEL
CHOLESTEROL: 223 mg/dL — AB (ref 0–200)
HDL: 39.8 mg/dL (ref >39.00)
LDL CALC: 146 mg/dL — AB (ref 0–99)
NonHDL: 183.61
TRIGLYCERIDES: 186 mg/dL — AB (ref 0.0–149.0)
Total CHOL/HDL Ratio: 6
VLDL: 37.2 mg/dL (ref 0.0–40.0)

## 2016-03-09 LAB — BASIC METABOLIC PANEL
BUN: 21 mg/dL (ref 6–23)
CALCIUM: 9.8 mg/dL (ref 8.4–10.5)
CO2: 31 meq/L (ref 19–32)
Chloride: 98 mEq/L (ref 96–112)
Creatinine, Ser: 0.9 mg/dL (ref 0.40–1.20)
GFR: 64.73 mL/min (ref >60.00)
GLUCOSE: 135 mg/dL — AB (ref 70–99)
Potassium: 3.4 mEq/L — ABNORMAL LOW (ref 3.5–5.1)
SODIUM: 137 meq/L (ref 135–145)

## 2016-03-09 LAB — HEMOGLOBIN A1C: HEMOGLOBIN A1C: 6.3 % (ref 4.6–6.5)

## 2016-03-09 MED ORDER — EZETIMIBE 10 MG PO TABS: 10.0000 mg | ORAL_TABLET | Freq: Every day | ORAL | 3 refills | Status: DC

## 2016-03-09 NOTE — Patient Instructions (Signed)
Please take all new medication as prescribed - the zetia 10 mg for cholesterol  Please continue all other medications as before, and refills have been done if requested.  Please have the pharmacy call with any other refills you may need.  Please continue your efforts at being more active, low cholesterol diet, and weight control.  You are otherwise up to date with prevention measures today.  Please keep your appointments with your specialists as you may have planned  You will be contacted regarding the referral for: Vascular surgury, as well as the Diagnostic Mammogram at [facility]  Please go to the LAB in the Basement (turn left off the elevator) for the tests to be done today  You will be contacted by phone if any changes need to be made immediately.  Otherwise, you will receive a letter about your results with an explanation, but please check with MyChart first.  Please remember to sign up for MyChart if you have not done so, as this will be important to you in the future with finding out test results, communicating by private email, and scheduling acute appointments online when needed.  Please return in 6 months, or sooner if needed

## 2016-03-09 NOTE — Progress Notes (Signed)
Pre visit review using our clinic review tool, if applicable. No additional management support is needed unless otherwise documented below in the visit note. 

## 2016-03-09 NOTE — Progress Notes (Signed)
Subjective:    Patient ID: Misty [name], female    DOB: [birth year], 75 y.o.   MRN: [id number]  HPI  Here for yearly f/u;  Overall doing ok;  Pt denies Chest pain, worsening SOB, DOE, wheezing, orthopnea, PND, worsening LE edema, palpitations, dizziness or syncope.  Pt denies neurological change such as new headache, facial or extremity weakness.  Pt denies polydipsia, polyuria, or low sugar symptoms. Pt states overall good compliance with treatment and medications, good tolerability, and has been trying to follow appropriate diet.  Pt denies worsening depressive symptoms, suicidal ideation or panic. No fever, night sweats, wt loss, loss of appetite, or other constitutional symptoms.  Pt states good ability with ADL's, has low fall risk, home safety reviewed and adequate, no other significant changes in hearing or vision, and only occasionally active with exercise. Does have painful varicosities to both LE below the knees.  Also with new left breast mass noted for about 1 mo, seemed larger at one time and now smaller, mild discomfort but no overlying skin changes or left axillary lumps or pain.  No Fever, trauma.  No other history changes Past Medical History:  Diagnosis Date  . Acute bronchitis 07/06/2008  . ALLERGIC RHINITIS 02/12/2008  . ANEMIA-IRON DEFICIENCY 02/12/2008  . DIABETES MELLITUS, TYPE II 02/12/2008  . Facial cellulitis 01/01/2014  . FATIGUE 02/12/2008  . HYPERLIPIDEMIA 02/12/2008  . HYPERTENSION 11/05/2006  . KNEE PAIN, LEFT, ACUTE 05/11/2010  . LOW BACK PAIN 02/12/2008  . OSTEOPENIA 02/12/2008  . PEPTIC ULCER DISEASE 02/12/2008  . Shingles outbreak 01/02/2014  . SINUSITIS- ACUTE-NOS 10/08/2008   Past Surgical History:  Procedure Laterality Date  . ABDOMINAL HYSTERECTOMY    . APPENDECTOMY    . ESOPHAGOGASTRODUODENOSCOPY  1970    reports that she has quit smoking. She has never used smokeless tobacco. She reports that she drinks alcohol. She reports that she does not use  drugs. family history includes Colon polyps (age of onset: 40) in her brother; Coronary artery disease in her brother; Depression in her child; Hypertension in her brother; Mental illness in her other; Throat cancer in her other; Thyroid cancer in her brother. Allergies  Allergen Reactions  . Lipitor [Atorvastatin Calcium] Nausea Only  . Lovastatin Nausea Only  . Simvastatin Other (See Comments)    gi upset  . Tramadol Other (See Comments)    sleepiness   Current Outpatient Prescriptions on File Prior to Visit  Medication Sig Dispense Refill  . aspirin EC 81 MG tablet Take 81 mg by mouth daily with breakfast.    . valsartan-hydrochlorothiazide (DIOVAN-HCT) 320-25 MG tablet Take 1 tablet by mouth daily. 30 tablet 0   No current facility-administered medications on file prior to visit.    Review of Systems  Constitutional: Negative for unusual diaphoresis or night sweats HENT: Negative for ear swelling or discharge Eyes: Negative for worsening visual haziness  Respiratory: Negative for choking and stridor.   Gastrointestinal: Negative for distension or worsening eructation Genitourinary: Negative for retention or change in urine volume.  Musculoskeletal: Negative for other MSK pain or swelling Skin: Negative for color change and worsening wound Neurological: Negative for tremors and numbness other than noted  Psychiatric/Behavioral: Negative for decreased concentration or agitation other than above   All other system neg per pt    Objective:   Physical Exam BP 120/76   Pulse 99   Temp 98.1 F (36.7 C) (Oral)   Resp 20   Wt 154 lb (69.9 kg)  SpO2 98%   BMI 28.17 kg/m  VS noted,  Constitutional: Pt appears in no apparent distress HENT: Head: NCAT.  Right Ear: External ear normal.  Left Ear: External ear normal.  Eyes: . Pupils are equal, round, and reactive to light. Conjunctivae and EOM are normal Breasts: right without mass, d/c, skin change Left breast with > 1 cm  firm nontender subq mass without d/c, overlying skin change or axillary LA Neck: Normal range of motion. Neck supple.  Cardiovascular: Normal rate and regular rhythm.   Pulmonary/Chest: Effort normal and breath sounds without rales or wheezing.  Abd:  Soft, NT, ND, + BS Neurological: Pt is alert. Not confused , motor grossly intact Skin: Skin is warm. No rash, no LE edema but has numerous varicosities without phlebitis to LE Psychiatric: Pt behavior is normal. No agitation.  No other new exam findings    Assessment & Plan:

## 2016-03-10 ENCOUNTER — Encounter: Payer: Self-pay | Admitting: Internal Medicine

## 2016-03-15 NOTE — Assessment & Plan Note (Signed)
Etiology unclear, but for diagnostic mammogram asap, consider general surgury referral

## 2016-03-15 NOTE — Assessment & Plan Note (Signed)
stable overall by history and exam, recent data reviewed with pt, and pt to continue medical treatment as before,  to f/u any worsening symptoms or concerns Lab Results  Component Value Date   HGBA1C 6.3 03/09/2016   

## 2016-03-15 NOTE — Assessment & Plan Note (Addendum)
With subjective pain, for vascular surgury referral  Note:  Total time for pt hx, exam, review of record with pt in the room, determination of diagnoses and plan for further eval and tx is > 40 min, with over 50% spent in coordination and counseling of patient

## 2016-03-15 NOTE — Assessment & Plan Note (Signed)
stable overall by history and exam, recent data reviewed with pt, and pt to continue medical treatment as before,  to f/u any worsening symptoms or concerns Lab Results  Component Value Date   LDLCALC 146 (H) 03/09/2016   Has been statin intolerant, ok for zetia 10 qd

## 2016-03-15 NOTE — Assessment & Plan Note (Signed)
stable overall by history and exam, recent data reviewed with pt, and pt to continue medical treatment as before,  to f/u any worsening symptoms or concerns BP Readings from Last 3 Encounters:  03/09/16 120/76  09/12/15 120/68  07/18/14 114/68

## 2016-03-16 ENCOUNTER — Telehealth: Payer: Self-pay | Admitting: Internal Medicine

## 2016-03-16 DIAGNOSIS — Z1239 Encounter for other screening for malignant neoplasm of breast: Secondary | ICD-10-CM

## 2016-03-16 DIAGNOSIS — N6320 Unspecified lump in the left breast, unspecified quadrant: Secondary | ICD-10-CM

## 2016-03-16 DIAGNOSIS — C50512 Malignant neoplasm of lower-outer quadrant of left female breast: Secondary | ICD-10-CM

## 2016-03-16 DIAGNOSIS — N63 Unspecified lump in unspecified breast: Secondary | ICD-10-CM

## 2016-03-16 NOTE — Telephone Encounter (Signed)
Order done

## 2016-04-07 ENCOUNTER — Ambulatory Visit
Admission: RE | Admit: 2016-04-07 | Discharge: 2016-04-07 | Disposition: A | Discharge status: Home / Self Care | Payer: Medicare Other | Source: Ambulatory Visit | Attending: Internal Medicine | Admitting: Internal Medicine

## 2016-04-07 ENCOUNTER — Other Ambulatory Visit: Payer: Medicare Other

## 2016-04-07 ENCOUNTER — Other Ambulatory Visit: Payer: Self-pay | Admitting: Internal Medicine

## 2016-04-07 DIAGNOSIS — N6324 Unspecified lump in the left breast, lower inner quadrant: Secondary | ICD-10-CM | POA: Diagnosis not present

## 2016-04-07 DIAGNOSIS — C50512 Malignant neoplasm of lower-outer quadrant of left female breast: Secondary | ICD-10-CM

## 2016-04-09 ENCOUNTER — Other Ambulatory Visit: Payer: Self-pay | Admitting: Internal Medicine

## 2016-04-09 ENCOUNTER — Ambulatory Visit
Admission: RE | Admit: 2016-04-09 | Discharge: 2016-04-09 | Disposition: A | Discharge status: Home / Self Care | Payer: Medicare Other | Source: Ambulatory Visit | Attending: Internal Medicine | Admitting: Internal Medicine

## 2016-04-09 DIAGNOSIS — C50512 Malignant neoplasm of lower-outer quadrant of left female breast: Secondary | ICD-10-CM

## 2016-04-09 DIAGNOSIS — Z170 Estrogen receptor positive status [ER+]: Secondary | ICD-10-CM | POA: Diagnosis not present

## 2016-04-09 DIAGNOSIS — C50312 Malignant neoplasm of lower-inner quadrant of left female breast: Secondary | ICD-10-CM | POA: Diagnosis not present

## 2016-04-09 DIAGNOSIS — N6324 Unspecified lump in the left breast, lower inner quadrant: Secondary | ICD-10-CM | POA: Diagnosis not present

## 2016-04-15 ENCOUNTER — Encounter: Payer: Self-pay | Admitting: Vascular Surgery

## 2016-04-17 ENCOUNTER — Encounter: Payer: Self-pay | Admitting: General Surgery

## 2016-04-20 ENCOUNTER — Encounter: Payer: Medicare Other | Admitting: Vascular Surgery

## 2016-04-20 DIAGNOSIS — C50312 Malignant neoplasm of lower-inner quadrant of left female breast: Secondary | ICD-10-CM | POA: Diagnosis not present

## 2016-04-20 DIAGNOSIS — I10 Essential (primary) hypertension: Secondary | ICD-10-CM | POA: Diagnosis not present

## 2016-04-20 DIAGNOSIS — E785 Hyperlipidemia, unspecified: Secondary | ICD-10-CM | POA: Diagnosis not present

## 2016-04-20 DIAGNOSIS — Z90710 Acquired absence of both cervix and uterus: Secondary | ICD-10-CM | POA: Diagnosis not present

## 2016-04-20 DIAGNOSIS — Z9889 Other specified postprocedural states: Secondary | ICD-10-CM | POA: Diagnosis not present

## 2016-04-21 ENCOUNTER — Other Ambulatory Visit: Payer: Self-pay | Admitting: General Surgery

## 2016-04-21 DIAGNOSIS — C50312 Malignant neoplasm of lower-inner quadrant of left female breast: Secondary | ICD-10-CM

## 2016-04-22 ENCOUNTER — Ambulatory Visit
Admission: RE | Admit: 2016-04-22 | Discharge: 2016-04-22 | Disposition: A | Discharge status: Home / Self Care | Payer: Medicare Other | Source: Ambulatory Visit | Attending: General Surgery | Admitting: General Surgery

## 2016-04-22 DIAGNOSIS — D0512 Intraductal carcinoma in situ of left breast: Secondary | ICD-10-CM | POA: Diagnosis not present

## 2016-04-22 DIAGNOSIS — C50312 Malignant neoplasm of lower-inner quadrant of left female breast: Secondary | ICD-10-CM

## 2016-04-22 MED ORDER — GADOBENATE DIMEGLUMINE 529 MG/ML IV SOLN
13.0000 mL | Freq: Once | INTRAVENOUS | Status: AC | PRN
  Administered 2016-04-22: 13 mL via INTRAVENOUS

## 2016-04-23 ENCOUNTER — Encounter: Payer: Self-pay | Admitting: Internal Medicine

## 2016-04-23 ENCOUNTER — Encounter: Payer: Self-pay | Admitting: General Surgery

## 2016-04-23 ENCOUNTER — Telehealth: Payer: Self-pay | Admitting: Hematology and Oncology

## 2016-04-23 ENCOUNTER — Encounter: Payer: Self-pay | Admitting: Hematology and Oncology

## 2016-04-23 DIAGNOSIS — C50312 Malignant neoplasm of lower-inner quadrant of left female breast: Secondary | ICD-10-CM

## 2016-04-23 HISTORY — DX: Malignant neoplasm of lower-inner quadrant of left female breast: C50.312

## 2016-04-23 NOTE — Telephone Encounter (Signed)
Apt has been scheduled for the pt to see Dr. Lindi Adie on 1/31'@1pm'$ . Pt aware to arrive 30 minutes early. Demographics verified. Letter mailed.

## 2016-04-27 NOTE — Assessment & Plan Note (Signed)
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 After reviewing the pathology in detail, we proceeded to discuss the different treatment options between surgery, radiation, chemotherapy, antiestrogen therapies.  Recommendations: 1. Breast conserving surgery followed by 2. Oncotype DX testing to determine if chemotherapy would be of any benefit followed by 3. Adjuvant radiation therapy followed by 4. Adjuvant antiestrogen therapy  Oncotype counseling: I discussed Oncotype DX test. I explained to the patient that this is a 21 gene panel to evaluate patient tumors DNA to calculate recurrence score. This would help determine whether patient has high risk or intermediate risk or low risk breast cancer. She understands that if her tumor was found to be high risk, she would benefit from systemic chemotherapy. If low risk, no need of chemotherapy. If she was found to be intermediate risk, we would need to evaluate the score as well as other risk factors and determine if an abbreviated chemotherapy may be of benefit.  Return to clinic after surgery to discuss final pathology report and then determine if Oncotype DX testing will need to be sent.

## 2016-04-28 ENCOUNTER — Encounter: Payer: Self-pay | Admitting: Hematology and Oncology

## 2016-04-28 ENCOUNTER — Ambulatory Visit (HOSPITAL_BASED_OUTPATIENT_CLINIC_OR_DEPARTMENT_OTHER): Payer: Medicare Other | Admitting: Hematology and Oncology

## 2016-04-28 ENCOUNTER — Telehealth: Payer: Self-pay | Admitting: *Deleted

## 2016-04-28 DIAGNOSIS — Z170 Estrogen receptor positive status [ER+]: Secondary | ICD-10-CM | POA: Diagnosis not present

## 2016-04-28 DIAGNOSIS — C50312 Malignant neoplasm of lower-inner quadrant of left female breast: Secondary | ICD-10-CM | POA: Diagnosis not present

## 2016-04-28 MED ORDER — LETROZOLE 2.5 MG PO TABS: 2.5000 mg | ORAL_TABLET | Freq: Every day | ORAL | 3 refills | Status: AC

## 2016-04-28 NOTE — Telephone Encounter (Signed)
Received order per Dr. Lindi Adie for oncotype testing. Requisition sent to [facility]

## 2016-04-28 NOTE — Progress Notes (Signed)
[facility] [facility] CONSULT NOTE  Patient Care Team: Biagio Borg, MD as PCP - General  CHIEF COMPLAINTS/PURPOSE OF CONSULTATION:  Newly diagnosed breast cancer  HISTORY OF PRESENTING ILLNESS:  Misty [name] 76 y.o. female is here because of recent diagnosis of left breast cancer. Patient had a palpable abnormality in the left breast for up to 6 months. She finally obtain a mammogram that revealed a 6 cm mass in the left breast. Were no enlarged lymph nodes in axilla. She underwent a biopsy on 04/09/2016 and it revealed invasive ductal carcinoma with DCIS that was ER/PR positive HER-2 negative with a Ki-67 12 %. She was seen by Dr. Dalbert Batman who recommended neoadjuvant therapy. She was sent was for discussion regarding treatment options. She is here today accompanied by her daughter.  I reviewed her records extensively and collaborated the history with the patient.  SUMMARY OF ONCOLOGIC HISTORY:   Cancer of lower-inner quadrant of left female breast ([place])   04/07/2016 Mammogram    Left breast mass: 6 x 4.3 x 3.9 cm at 8:00 position 8 cm from the nipple, no enlarged lymph nodes       04/09/2016 Initial Diagnosis    Left breast biopsy: Invasive ductal carcinoma with DCIS, ER 95%, PR 70%, HER-2 negative ratio 1.43, Ki-67 12%      MEDICAL HISTORY:  Past Medical History:  Diagnosis Date  . Acute bronchitis 07/06/2008  . ALLERGIC RHINITIS 02/12/2008  . ANEMIA-IRON DEFICIENCY 02/12/2008  . Cancer of lower-inner quadrant of left female breast ([place]) 04/23/2016  . DIABETES MELLITUS, TYPE II 02/12/2008  . Facial cellulitis 01/01/2014  . FATIGUE 02/12/2008  . HYPERLIPIDEMIA 02/12/2008  . HYPERTENSION 11/05/2006  . KNEE PAIN, LEFT, ACUTE 05/11/2010  . LOW BACK PAIN 02/12/2008  . OSTEOPENIA 02/12/2008  . PEPTIC ULCER DISEASE 02/12/2008  . Shingles outbreak 01/02/2014  . SINUSITIS- ACUTE-NOS 10/08/2008    SURGICAL HISTORY: Past Surgical History:  Procedure Laterality Date  . ABDOMINAL  HYSTERECTOMY    . APPENDECTOMY    . ESOPHAGOGASTRODUODENOSCOPY  1970    SOCIAL HISTORY: Social History   Social History  . Marital status: Divorced    Spouse name: N/A  . Number of children: 4  . Years of education: N/A   Occupational History  . 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 Smoking status: Former Research scientist (life sciences)  . Smokeless tobacco: Never Used  . Alcohol use Yes     Comment: rare  . Drug use: No  . Sexual activity: Not on file   Other Topics Concern  . Not on file   Social History Narrative  . No narrative on file    FAMILY HISTORY: Family History  Problem Relation Age of Onset  . Hypertension Brother   . Colon polyps Brother 47  . Coronary artery disease Brother   . Thyroid cancer Brother   . Mental illness Other   . Depression Child     manic depression  . Throat cancer Other     ALLERGIES:  is allergic to lipitor [atorvastatin calcium]; lovastatin; simvastatin; and tramadol.  MEDICATIONS:  Current Outpatient Prescriptions  Medication Sig Dispense Refill  . aspirin EC 81 MG tablet Take 81 mg by mouth daily with breakfast.    . ezetimibe (ZETIA) 10 MG tablet Take 1 tablet (10 mg total) by mouth daily. 90 tablet 3  . valsartan-hydrochlorothiazide (DIOVAN-HCT) 320-25 MG tablet Take 1 tablet by mouth daily. 30 tablet  0   No current facility-administered medications for this visit.     REVIEW OF SYSTEMS:   Constitutional: Denies fevers, chills or abnormal night sweats Eyes: Denies blurriness of vision, double vision or watery eyes Ears, nose, mouth, throat, and face: Denies mucositis or sore throat Respiratory: Denies cough, dyspnea or wheezes Cardiovascular: Denies palpitation, chest discomfort or lower extremity swelling Gastrointestinal:  Denies nausea, heartburn or change in bowel habits Skin: Denies abnormal skin rashes Lymphatics: Denies new lymphadenopathy or easy bruising Neurological:Denies numbness,  tingling or new weaknesses Behavioral/Psych: Mood is stable, no new changes  Breast: Large palpable mass in the left breast underneath stuck to the pectoralis muscle All other systems were reviewed with the patient and are negative.  PHYSICAL EXAMINATION: ECOG PERFORMANCE STATUS: 1 - Symptomatic but completely ambulatory  Vitals:   04/28/16 1302  BP: 137/66  Pulse: 69  Resp: 18  Temp: 97.8 F (36.6 C)   Filed Weights   04/28/16 1302  Weight: 149 lb 8 oz (67.8 kg)    GENERAL:alert, no distress and comfortable SKIN: skin color, texture, turgor are normal, no rashes or significant lesions EYES: normal, conjunctiva are pink and non-injected, sclera clear OROPHARYNX:no exudate, no erythema and lips, buccal mucosa, and tongue normal  NECK: supple, thyroid normal size, non-tender, without nodularity LYMPH:  no palpable lymphadenopathy in the cervical, axillary or inguinal LUNGS: clear to auscultation and percussion with normal breathing effort HEART: regular rate & rhythm and no murmurs and no lower extremity edema ABDOMEN:abdomen soft, non-tender and normal bowel sounds Musculoskeletal:no cyanosis of digits and no clubbing  PSYCH: alert & oriented x 3 with fluent speech NEURO: no focal motor/sensory deficits BREAST:Large palpable mass in the left breast stuck to the pectoralis muscle. No palpable axillary or supraclavicular lymphadenopathy (exam performed in the presence of a chaperone)   LABORATORY DATA:  I have reviewed the data as listed Lab Results  Component Value Date   WBC 9.3 09/12/2015   HGB 12.2 09/12/2015   HCT 36.5 09/12/2015   MCV 81.8 09/12/2015   PLT 316.0 09/12/2015   Lab Results  Component Value Date   NA 137 03/09/2016   K 3.4 (L) 03/09/2016   CL 98 03/09/2016   CO2 31 03/09/2016    RADIOGRAPHIC STUDIES: I have personally reviewed the radiological reports and agreed with the findings in the report.  ASSESSMENT AND PLAN:  Cancer of lower-inner  quadrant of left female breast ([place]) Left breast biopsy 04/09/2016: Invasive ductal carcinoma with DCIS, ER 95%, PR 70%, HER-2 negative ratio 1.43, Ki-67 12%;  Mammogram 04/07/2016: 6 x 4.3 x 3.9 cm palpable mass 8:00 position the left breast Clinical staging: T4 N0 stage IIIB  Breast MRI: cancer over the inner lower quadrant left breast measuring 4.8 x 4.6 x 6.5 cm with evidence of pectoralis muscle invasion, but no involvement of underlying chest wall. Abnormal left internal mammary lymph node measuring 9 x 16 mm. T4aN? (Stage 3B)  Pathology and radiology counseling:Discussed with the patient, the details of pathology including the type of breast cancer,the clinical staging, the significance of ER, PR and HER-2/neu receptors and the implications for treatment. After reviewing the pathology in detail, we proceeded to discuss the different treatment options between surgery, radiation, chemotherapy, antiestrogen therapies.  Recommendations: 1. Neoadjuvant therapy based upon the result of Oncotype DX 2. followed by surgery possibly mastectomy (patient is really hoping for lumpectomy) 3. Adjuvant radiation therapy followed by 4. Adjuvant antiestrogen therapy  Oncotype counseling: I discussed Oncotype DX test.  I explained to the patient that this is a 21 gene panel to evaluate patient tumors DNA to calculate recurrence score. This would help determine whether patient has high risk or intermediate risk or low risk breast cancer. She understands that if her tumor was found to be high risk, she would benefit from systemic chemotherapy. If low risk, no need of chemotherapy. If she was found to be intermediate risk, we would need to evaluate the score as well as other risk factors and determine if an abbreviated chemotherapy may be of benefit.  Return to clinic based on Oncotype DX score. If she is found to be high intermediate to high risk, then she will need chemotherapy.  All questions were answered.  The patient knows to call the clinic with any problems, questions or concerns.    Rulon Eisenmenger, MD 04/28/16

## 2016-04-29 ENCOUNTER — Telehealth: Payer: Self-pay | Admitting: *Deleted

## 2016-04-29 NOTE — Telephone Encounter (Signed)
Called pt to discuss treatment care plan. Denies questions or needs at this time. Gave navigation resources and contact information. Encourage pt to call with needs. Received verbal understanding.

## 2016-05-05 DIAGNOSIS — C50912 Malignant neoplasm of unspecified site of left female breast: Secondary | ICD-10-CM | POA: Diagnosis not present

## 2016-05-07 DIAGNOSIS — C50312 Malignant neoplasm of lower-inner quadrant of left female breast: Secondary | ICD-10-CM | POA: Diagnosis not present

## 2016-05-10 ENCOUNTER — Telehealth: Payer: Self-pay | Admitting: *Deleted

## 2016-05-10 ENCOUNTER — Encounter (HOSPITAL_COMMUNITY): Payer: Self-pay

## 2016-05-10 NOTE — Telephone Encounter (Signed)
Received oncotype score of 19/12%. Physician team notified. Per Dr. Lindi Adie, pt does NOT need chemotherapy. Left vm requesting pt to return call to discuss results. Contact information provided.

## 2016-05-10 NOTE — Telephone Encounter (Signed)
Received Oncotype Dx results of 19/12%.  Placed a copy on Dr. Geralyn Flash desk, gave [name] a copy and took a copy to HIM to scan.

## 2016-05-11 DIAGNOSIS — R920 Mammographic microcalcification found on diagnostic imaging of breast: Secondary | ICD-10-CM | POA: Diagnosis not present

## 2016-05-11 DIAGNOSIS — C50912 Malignant neoplasm of unspecified site of left female breast: Secondary | ICD-10-CM | POA: Diagnosis not present

## 2016-05-11 DIAGNOSIS — R590 Localized enlarged lymph nodes: Secondary | ICD-10-CM | POA: Diagnosis not present

## 2016-05-11 DIAGNOSIS — C50312 Malignant neoplasm of lower-inner quadrant of left female breast: Secondary | ICD-10-CM | POA: Diagnosis not present

## 2016-05-12 ENCOUNTER — Telehealth: Payer: Self-pay | Admitting: Hematology and Oncology

## 2016-05-12 NOTE — Telephone Encounter (Signed)
FAXED RECORDS TO [facility] RELEASE ID [id number]

## 2016-05-13 DIAGNOSIS — N6321 Unspecified lump in the left breast, upper outer quadrant: Secondary | ICD-10-CM | POA: Diagnosis not present

## 2016-05-13 DIAGNOSIS — Z170 Estrogen receptor positive status [ER+]: Secondary | ICD-10-CM | POA: Diagnosis not present

## 2016-05-13 DIAGNOSIS — R413 Other amnesia: Secondary | ICD-10-CM | POA: Diagnosis not present

## 2016-05-13 DIAGNOSIS — C50312 Malignant neoplasm of lower-inner quadrant of left female breast: Secondary | ICD-10-CM | POA: Diagnosis not present

## 2016-05-13 DIAGNOSIS — I10 Essential (primary) hypertension: Secondary | ICD-10-CM | POA: Diagnosis not present

## 2016-05-13 DIAGNOSIS — Z79811 Long term (current) use of aromatase inhibitors: Secondary | ICD-10-CM | POA: Diagnosis not present

## 2016-05-13 DIAGNOSIS — M810 Age-related osteoporosis without current pathological fracture: Secondary | ICD-10-CM | POA: Diagnosis not present

## 2016-05-13 DIAGNOSIS — C50912 Malignant neoplasm of unspecified site of left female breast: Secondary | ICD-10-CM | POA: Diagnosis not present

## 2016-05-13 DIAGNOSIS — Z90710 Acquired absence of both cervix and uterus: Secondary | ICD-10-CM | POA: Diagnosis not present

## 2016-05-13 DIAGNOSIS — E785 Hyperlipidemia, unspecified: Secondary | ICD-10-CM | POA: Diagnosis not present

## 2016-05-13 DIAGNOSIS — R7303 Prediabetes: Secondary | ICD-10-CM | POA: Diagnosis not present

## 2016-05-13 DIAGNOSIS — Z9189 Other specified personal risk factors, not elsewhere classified: Secondary | ICD-10-CM | POA: Diagnosis not present

## 2016-05-13 DIAGNOSIS — Z79899 Other long term (current) drug therapy: Secondary | ICD-10-CM | POA: Diagnosis not present

## 2016-05-13 DIAGNOSIS — Z808 Family history of malignant neoplasm of other organs or systems: Secondary | ICD-10-CM | POA: Diagnosis not present

## 2016-05-17 ENCOUNTER — Telehealth: Payer: Self-pay | Admitting: *Deleted

## 2016-05-17 NOTE — Telephone Encounter (Signed)
Received notification from Misty [name] that pt has decided to have sx at [facility]. Called pt to verify and to see if she decided to have remainder of care at [facility]. Pt informed she will have all treatment at [facility]. Informed pt to please call if she has needs or questions in the future. Received verbal understanding.

## 2016-05-25 ENCOUNTER — Encounter: Payer: Self-pay | Admitting: Vascular Surgery

## 2016-05-31 ENCOUNTER — Encounter: Payer: Medicare Other | Admitting: Vascular Surgery

## 2016-06-02 DIAGNOSIS — I10 Essential (primary) hypertension: Secondary | ICD-10-CM | POA: Diagnosis not present

## 2016-06-02 DIAGNOSIS — M8589 Other specified disorders of bone density and structure, multiple sites: Secondary | ICD-10-CM | POA: Diagnosis not present

## 2016-06-02 DIAGNOSIS — C50312 Malignant neoplasm of lower-inner quadrant of left female breast: Secondary | ICD-10-CM | POA: Diagnosis not present

## 2016-06-02 DIAGNOSIS — G3184 Mild cognitive impairment, so stated: Secondary | ICD-10-CM | POA: Diagnosis not present

## 2016-06-02 DIAGNOSIS — Z170 Estrogen receptor positive status [ER+]: Secondary | ICD-10-CM | POA: Diagnosis not present

## 2016-06-02 DIAGNOSIS — R7303 Prediabetes: Secondary | ICD-10-CM | POA: Diagnosis not present

## 2016-06-17 DIAGNOSIS — R911 Solitary pulmonary nodule: Secondary | ICD-10-CM | POA: Diagnosis not present

## 2016-06-17 DIAGNOSIS — M810 Age-related osteoporosis without current pathological fracture: Secondary | ICD-10-CM | POA: Diagnosis not present

## 2016-06-17 DIAGNOSIS — Z9189 Other specified personal risk factors, not elsewhere classified: Secondary | ICD-10-CM | POA: Diagnosis not present

## 2016-06-17 DIAGNOSIS — E785 Hyperlipidemia, unspecified: Secondary | ICD-10-CM | POA: Diagnosis not present

## 2016-06-17 DIAGNOSIS — K769 Liver disease, unspecified: Secondary | ICD-10-CM | POA: Diagnosis not present

## 2016-06-17 DIAGNOSIS — R918 Other nonspecific abnormal finding of lung field: Secondary | ICD-10-CM | POA: Diagnosis not present

## 2016-06-17 DIAGNOSIS — I10 Essential (primary) hypertension: Secondary | ICD-10-CM | POA: Diagnosis not present

## 2016-06-17 DIAGNOSIS — M8580 Other specified disorders of bone density and structure, unspecified site: Secondary | ICD-10-CM | POA: Diagnosis not present

## 2016-06-17 DIAGNOSIS — Z8249 Family history of ischemic heart disease and other diseases of the circulatory system: Secondary | ICD-10-CM | POA: Diagnosis not present

## 2016-06-17 DIAGNOSIS — F09 Unspecified mental disorder due to known physiological condition: Secondary | ICD-10-CM | POA: Diagnosis not present

## 2016-06-17 DIAGNOSIS — Z780 Asymptomatic menopausal state: Secondary | ICD-10-CM | POA: Diagnosis not present

## 2016-06-17 DIAGNOSIS — Z79811 Long term (current) use of aromatase inhibitors: Secondary | ICD-10-CM | POA: Diagnosis not present

## 2016-06-17 DIAGNOSIS — J9811 Atelectasis: Secondary | ICD-10-CM | POA: Diagnosis not present

## 2016-06-17 DIAGNOSIS — R7303 Prediabetes: Secondary | ICD-10-CM | POA: Diagnosis not present

## 2016-06-17 DIAGNOSIS — Z170 Estrogen receptor positive status [ER+]: Secondary | ICD-10-CM | POA: Diagnosis not present

## 2016-06-17 DIAGNOSIS — Z79899 Other long term (current) drug therapy: Secondary | ICD-10-CM | POA: Diagnosis not present

## 2016-06-17 DIAGNOSIS — Z808 Family history of malignant neoplasm of other organs or systems: Secondary | ICD-10-CM | POA: Diagnosis not present

## 2016-06-17 DIAGNOSIS — R590 Localized enlarged lymph nodes: Secondary | ICD-10-CM | POA: Diagnosis not present

## 2016-06-17 DIAGNOSIS — J984 Other disorders of lung: Secondary | ICD-10-CM | POA: Diagnosis not present

## 2016-06-17 DIAGNOSIS — C50912 Malignant neoplasm of unspecified site of left female breast: Secondary | ICD-10-CM | POA: Diagnosis not present

## 2016-06-22 DIAGNOSIS — Z170 Estrogen receptor positive status [ER+]: Secondary | ICD-10-CM | POA: Diagnosis not present

## 2016-06-22 DIAGNOSIS — C50312 Malignant neoplasm of lower-inner quadrant of left female breast: Secondary | ICD-10-CM | POA: Diagnosis not present

## 2016-06-23 DIAGNOSIS — G319 Degenerative disease of nervous system, unspecified: Secondary | ICD-10-CM | POA: Diagnosis not present

## 2016-06-23 DIAGNOSIS — R930 Abnormal findings on diagnostic imaging of skull and head, not elsewhere classified: Secondary | ICD-10-CM | POA: Diagnosis not present

## 2016-06-23 DIAGNOSIS — C50912 Malignant neoplasm of unspecified site of left female breast: Secondary | ICD-10-CM | POA: Diagnosis not present

## 2016-06-23 DIAGNOSIS — R413 Other amnesia: Secondary | ICD-10-CM | POA: Diagnosis not present

## 2016-06-23 DIAGNOSIS — I6782 Cerebral ischemia: Secondary | ICD-10-CM | POA: Diagnosis not present

## 2016-06-30 DIAGNOSIS — R918 Other nonspecific abnormal finding of lung field: Secondary | ICD-10-CM | POA: Diagnosis not present

## 2016-06-30 DIAGNOSIS — C801 Malignant (primary) neoplasm, unspecified: Secondary | ICD-10-CM | POA: Diagnosis not present

## 2016-06-30 DIAGNOSIS — R590 Localized enlarged lymph nodes: Secondary | ICD-10-CM | POA: Diagnosis not present

## 2016-06-30 DIAGNOSIS — R911 Solitary pulmonary nodule: Secondary | ICD-10-CM | POA: Diagnosis not present

## 2016-06-30 DIAGNOSIS — I10 Essential (primary) hypertension: Secondary | ICD-10-CM | POA: Diagnosis not present

## 2016-06-30 DIAGNOSIS — E785 Hyperlipidemia, unspecified: Secondary | ICD-10-CM | POA: Diagnosis not present

## 2016-06-30 DIAGNOSIS — E119 Type 2 diabetes mellitus without complications: Secondary | ICD-10-CM | POA: Diagnosis not present

## 2016-06-30 DIAGNOSIS — C3490 Malignant neoplasm of unspecified part of unspecified bronchus or lung: Secondary | ICD-10-CM | POA: Diagnosis not present

## 2016-06-30 DIAGNOSIS — C771 Secondary and unspecified malignant neoplasm of intrathoracic lymph nodes: Secondary | ICD-10-CM | POA: Diagnosis not present

## 2016-07-13 DIAGNOSIS — Z853 Personal history of malignant neoplasm of breast: Secondary | ICD-10-CM | POA: Diagnosis not present

## 2016-07-13 DIAGNOSIS — R7303 Prediabetes: Secondary | ICD-10-CM | POA: Diagnosis not present

## 2016-07-13 DIAGNOSIS — C50312 Malignant neoplasm of lower-inner quadrant of left female breast: Secondary | ICD-10-CM | POA: Diagnosis not present

## 2016-07-13 DIAGNOSIS — C50012 Malignant neoplasm of nipple and areola, left female breast: Secondary | ICD-10-CM | POA: Diagnosis not present

## 2016-07-13 DIAGNOSIS — I10 Essential (primary) hypertension: Secondary | ICD-10-CM | POA: Diagnosis not present

## 2016-07-13 DIAGNOSIS — Z79811 Long term (current) use of aromatase inhibitors: Secondary | ICD-10-CM | POA: Diagnosis not present

## 2016-07-13 DIAGNOSIS — F0390 Unspecified dementia without behavioral disturbance: Secondary | ICD-10-CM | POA: Diagnosis not present

## 2016-07-13 DIAGNOSIS — C3480 Malignant neoplasm of overlapping sites of unspecified bronchus and lung: Secondary | ICD-10-CM | POA: Diagnosis not present

## 2016-07-13 DIAGNOSIS — G3184 Mild cognitive impairment, so stated: Secondary | ICD-10-CM | POA: Diagnosis not present

## 2016-07-13 DIAGNOSIS — Z808 Family history of malignant neoplasm of other organs or systems: Secondary | ICD-10-CM | POA: Diagnosis not present

## 2016-07-13 DIAGNOSIS — C3412 Malignant neoplasm of upper lobe, left bronchus or lung: Secondary | ICD-10-CM | POA: Diagnosis not present

## 2016-07-13 DIAGNOSIS — N951 Menopausal and female climacteric states: Secondary | ICD-10-CM | POA: Diagnosis not present

## 2016-07-13 DIAGNOSIS — Z79899 Other long term (current) drug therapy: Secondary | ICD-10-CM | POA: Diagnosis not present

## 2016-07-13 DIAGNOSIS — Z9889 Other specified postprocedural states: Secondary | ICD-10-CM | POA: Diagnosis not present

## 2016-07-13 DIAGNOSIS — R911 Solitary pulmonary nodule: Secondary | ICD-10-CM | POA: Diagnosis not present

## 2016-07-13 DIAGNOSIS — E785 Hyperlipidemia, unspecified: Secondary | ICD-10-CM | POA: Diagnosis not present

## 2016-07-13 DIAGNOSIS — Z170 Estrogen receptor positive status [ER+]: Secondary | ICD-10-CM | POA: Diagnosis not present

## 2016-07-13 DIAGNOSIS — C3492 Malignant neoplasm of unspecified part of left bronchus or lung: Secondary | ICD-10-CM | POA: Diagnosis not present

## 2016-07-13 DIAGNOSIS — Z791 Long term (current) use of non-steroidal anti-inflammatories (NSAID): Secondary | ICD-10-CM | POA: Diagnosis not present

## 2016-07-22 DIAGNOSIS — C50012 Malignant neoplasm of nipple and areola, left female breast: Secondary | ICD-10-CM | POA: Diagnosis not present

## 2016-07-22 DIAGNOSIS — Z170 Estrogen receptor positive status [ER+]: Secondary | ICD-10-CM | POA: Diagnosis not present

## 2016-07-27 DIAGNOSIS — Z79890 Hormone replacement therapy (postmenopausal): Secondary | ICD-10-CM | POA: Diagnosis not present

## 2016-07-27 DIAGNOSIS — C3480 Malignant neoplasm of overlapping sites of unspecified bronchus and lung: Secondary | ICD-10-CM | POA: Diagnosis not present

## 2016-07-27 DIAGNOSIS — Z510 Encounter for antineoplastic radiation therapy: Secondary | ICD-10-CM | POA: Diagnosis not present

## 2016-07-27 DIAGNOSIS — C3402 Malignant neoplasm of left main bronchus: Secondary | ICD-10-CM | POA: Diagnosis not present

## 2016-07-27 DIAGNOSIS — C50912 Malignant neoplasm of unspecified site of left female breast: Secondary | ICD-10-CM | POA: Diagnosis not present

## 2016-08-09 DIAGNOSIS — C3480 Malignant neoplasm of overlapping sites of unspecified bronchus and lung: Secondary | ICD-10-CM | POA: Diagnosis not present

## 2016-08-09 DIAGNOSIS — Z79890 Hormone replacement therapy (postmenopausal): Secondary | ICD-10-CM | POA: Diagnosis not present

## 2016-08-09 DIAGNOSIS — C50912 Malignant neoplasm of unspecified site of left female breast: Secondary | ICD-10-CM | POA: Diagnosis not present

## 2016-08-09 DIAGNOSIS — C3402 Malignant neoplasm of left main bronchus: Secondary | ICD-10-CM | POA: Diagnosis not present

## 2016-08-09 DIAGNOSIS — Z510 Encounter for antineoplastic radiation therapy: Secondary | ICD-10-CM | POA: Diagnosis not present

## 2016-08-12 DIAGNOSIS — Z510 Encounter for antineoplastic radiation therapy: Secondary | ICD-10-CM | POA: Diagnosis not present

## 2016-08-12 DIAGNOSIS — C3402 Malignant neoplasm of left main bronchus: Secondary | ICD-10-CM | POA: Diagnosis not present

## 2016-08-12 DIAGNOSIS — C50912 Malignant neoplasm of unspecified site of left female breast: Secondary | ICD-10-CM | POA: Diagnosis not present

## 2016-08-12 DIAGNOSIS — Z79890 Hormone replacement therapy (postmenopausal): Secondary | ICD-10-CM | POA: Diagnosis not present

## 2016-08-16 DIAGNOSIS — Z510 Encounter for antineoplastic radiation therapy: Secondary | ICD-10-CM | POA: Diagnosis not present

## 2016-08-16 DIAGNOSIS — Z79890 Hormone replacement therapy (postmenopausal): Secondary | ICD-10-CM | POA: Diagnosis not present

## 2016-08-16 DIAGNOSIS — C3402 Malignant neoplasm of left main bronchus: Secondary | ICD-10-CM | POA: Diagnosis not present

## 2016-08-16 DIAGNOSIS — C50912 Malignant neoplasm of unspecified site of left female breast: Secondary | ICD-10-CM | POA: Diagnosis not present

## 2016-08-18 DIAGNOSIS — C50912 Malignant neoplasm of unspecified site of left female breast: Secondary | ICD-10-CM | POA: Diagnosis not present

## 2016-08-18 DIAGNOSIS — Z510 Encounter for antineoplastic radiation therapy: Secondary | ICD-10-CM | POA: Diagnosis not present

## 2016-08-18 DIAGNOSIS — Z79890 Hormone replacement therapy (postmenopausal): Secondary | ICD-10-CM | POA: Diagnosis not present

## 2016-08-18 DIAGNOSIS — C3402 Malignant neoplasm of left main bronchus: Secondary | ICD-10-CM | POA: Diagnosis not present

## 2016-08-20 DIAGNOSIS — Z79890 Hormone replacement therapy (postmenopausal): Secondary | ICD-10-CM | POA: Diagnosis not present

## 2016-08-20 DIAGNOSIS — C50912 Malignant neoplasm of unspecified site of left female breast: Secondary | ICD-10-CM | POA: Diagnosis not present

## 2016-08-20 DIAGNOSIS — Z510 Encounter for antineoplastic radiation therapy: Secondary | ICD-10-CM | POA: Diagnosis not present

## 2016-08-20 DIAGNOSIS — C3402 Malignant neoplasm of left main bronchus: Secondary | ICD-10-CM | POA: Diagnosis not present

## 2016-08-24 DIAGNOSIS — C50912 Malignant neoplasm of unspecified site of left female breast: Secondary | ICD-10-CM | POA: Diagnosis not present

## 2016-08-24 DIAGNOSIS — C3480 Malignant neoplasm of overlapping sites of unspecified bronchus and lung: Secondary | ICD-10-CM | POA: Diagnosis not present

## 2016-08-24 DIAGNOSIS — Z79890 Hormone replacement therapy (postmenopausal): Secondary | ICD-10-CM | POA: Diagnosis not present

## 2016-08-24 DIAGNOSIS — C3402 Malignant neoplasm of left main bronchus: Secondary | ICD-10-CM | POA: Diagnosis not present

## 2016-08-24 DIAGNOSIS — Z510 Encounter for antineoplastic radiation therapy: Secondary | ICD-10-CM | POA: Diagnosis not present

## 2016-09-05 ENCOUNTER — Other Ambulatory Visit: Payer: Self-pay | Admitting: Internal Medicine

## 2016-09-07 ENCOUNTER — Ambulatory Visit: Payer: Medicare Other | Admitting: Internal Medicine

## 2016-09-16 DIAGNOSIS — Z170 Estrogen receptor positive status [ER+]: Secondary | ICD-10-CM | POA: Diagnosis not present

## 2016-09-16 DIAGNOSIS — C3402 Malignant neoplasm of left main bronchus: Secondary | ICD-10-CM | POA: Diagnosis not present

## 2016-09-16 DIAGNOSIS — C50912 Malignant neoplasm of unspecified site of left female breast: Secondary | ICD-10-CM | POA: Diagnosis not present

## 2016-10-19 ENCOUNTER — Encounter: Payer: Self-pay | Admitting: Vascular Surgery

## 2016-11-08 ENCOUNTER — Encounter: Payer: Self-pay | Admitting: Vascular Surgery

## 2016-11-08 ENCOUNTER — Ambulatory Visit (INDEPENDENT_AMBULATORY_CARE_PROVIDER_SITE_OTHER): Payer: Medicare Other | Admitting: Vascular Surgery

## 2016-11-08 VITALS — BP 104/58 | HR 60 | Temp 98.8°F | Resp 14 | Ht 63.0 in | Wt 138.0 lb

## 2016-11-08 DIAGNOSIS — I83893 Varicose veins of bilateral lower extremities with other complications: Secondary | ICD-10-CM | POA: Diagnosis not present

## 2016-11-08 NOTE — Progress Notes (Signed)
Subjective:     Patient ID: Misty [name], female   DOB: [birth year], 76 y.o.   MRN: [id number]  HPI This 76 year old female was referred by Dr. Jenny Reichmann for evaluation of bilateral varicose veins. Patient has a history of breast cancer treated currently at [facility]. She states she did have some injection treatments in the early 1990s and one of her legs. She denies any significant pain or swelling. She does not relate to compression stockings. She has noted some more prominent veins recently. She has no history of DVT thrombophlebitis stasis ulcers or bleeding.  Past Medical History:  Diagnosis Date  . Acute bronchitis 07/06/2008  . ALLERGIC RHINITIS 02/12/2008  . ANEMIA-IRON DEFICIENCY 02/12/2008  . Cancer of lower-inner quadrant of left female breast ([place]) 04/23/2016  . DIABETES MELLITUS, TYPE II 02/12/2008  . Facial cellulitis 01/01/2014  . FATIGUE 02/12/2008  . HYPERLIPIDEMIA 02/12/2008  . HYPERTENSION 11/05/2006  . KNEE PAIN, LEFT, ACUTE 05/11/2010  . LOW BACK PAIN 02/12/2008  . OSTEOPENIA 02/12/2008  . PEPTIC ULCER DISEASE 02/12/2008  . Shingles outbreak 01/02/2014  . SINUSITIS- ACUTE-NOS 10/08/2008    Social History  Substance Use Topics  . Smoking status: Former Research scientist (life sciences)  . Smokeless tobacco: Never Used  . Alcohol use Yes     Comment: rare    Family History  Problem Relation Age of Onset  . Hypertension Brother   . Colon polyps Brother 69  . Coronary artery disease Brother   . Thyroid cancer Brother   . Mental illness Other   . Depression Child        manic depression  . Throat cancer Other     Allergies  Allergen Reactions  . Lipitor [Atorvastatin Calcium] Nausea Only  . Lovastatin Nausea Only  . Simvastatin Other (See Comments)    gi upset  . Tramadol Other (See Comments)    sleepiness     Current Outpatient Prescriptions:  .  aspirin EC 81 MG tablet, Take 81 mg by mouth daily with breakfast., Disp: , Rfl:  .  ezetimibe (ZETIA) 10 MG tablet, Take 1 tablet (10  mg total) by mouth daily., Disp: 90 tablet, Rfl: 3 .  letrozole (FEMARA) 2.5 MG tablet, Take 1 tablet (2.5 mg total) by mouth daily., Disp: 90 tablet, Rfl: 3 .  valsartan-hydrochlorothiazide (DIOVAN-HCT) 320-25 MG tablet, TAKE 1 TABLET BY MOUTH DAILY., Disp: 90 tablet, Rfl: 1  Vitals:   11/08/16 1353  BP: (!) 104/58  Pulse: 60  Resp: 14  Temp: 98.8 F (37.1 C)  SpO2: 94%  Weight: 138 lb (62.6 kg)  Height: 5\' 3"  (1.6 m)    Body mass index is 24.45 kg/m.         Review of Systems Denies chest pain, dyspnea on exertion, PND, orthopnea, hemoptysis. Does have type 2 diabetes mellitus, hypertension, history of breast cancer.    Objective:   Physical Exam BP (!) 104/58 (BP Location: Left Arm, Patient Position: Sitting, Cuff Size: Normal)   Pulse 60   Temp 98.8 F (37.1 C)   Resp 14   Ht 5\' 3"  (1.6 m)   Wt 138 lb (62.6 kg)   SpO2 94%   BMI 24.45 kg/m     Gen.-alert and oriented x3 in no apparent distress HEENT normal for age Lungs no rhonchi or wheezing Cardiovascular regular rhythm no murmurs carotid pulses 3+ palpable no bruits audible Abdomen soft nontender no palpable masses Musculoskeletal free of  major deformities Skin clear -no rashes Neurologic normal Lower extremities 3+  femoral and dorsalis pedis pulses palpable bilaterally with no edema Both legs with some very small varicosities in the medial calf and some spider and small reticular veins medial lateral 5 posterior distal thigh. No hyperpigmentation or ulceration noted. No significant bulging varicosities noted.  Today I visualized both great saphenous veins using the SonoSite is a bedside exam and both great saphenous veins are small in caliber with no evidence of gross reflux       Assessment:     Scattered spider and reticular veins bilaterally-asymptomatic with no evidence gross reflux bilateral great saphenous veins on bedside ultrasound exam    Plan:     Would not recommend any treatment for  these telangiectasia which are asymptomatic I answered her questions regarding this and she seems satisfied with no treatment

## 2016-11-25 ENCOUNTER — Ambulatory Visit
Admission: RE | Admit: 2016-11-25 | Discharge: 2016-11-25 | Disposition: A | Discharge status: Home / Self Care | Payer: MEDICARE

## 2016-11-25 ENCOUNTER — Ambulatory Visit
Admission: RE | Admit: 2016-11-25 | Discharge: 2016-11-25 | Disposition: A | Discharge status: Home / Self Care | Payer: MEDICARE | Admitting: Adult Health

## 2016-11-25 DIAGNOSIS — Z170 Estrogen receptor positive status [ER+]: Secondary | ICD-10-CM

## 2016-11-25 DIAGNOSIS — C50912 Malignant neoplasm of unspecified site of left female breast: Principal | ICD-10-CM

## 2016-11-25 DIAGNOSIS — C3402 Malignant neoplasm of left main bronchus: Secondary | ICD-10-CM

## 2016-11-25 DIAGNOSIS — R922 Inconclusive mammogram: Secondary | ICD-10-CM | POA: Diagnosis not present

## 2016-11-25 DIAGNOSIS — N6321 Unspecified lump in the left breast, upper outer quadrant: Secondary | ICD-10-CM | POA: Diagnosis not present

## 2017-01-25 ENCOUNTER — Ambulatory Visit
Admission: RE | Admit: 2017-01-25 | Discharge: 2017-01-26 | Disposition: A | Discharge status: Home / Self Care | Payer: MEDICARE

## 2017-01-25 ENCOUNTER — Ambulatory Visit
Admission: RE | Admit: 2017-01-25 | Discharge: 2017-01-26 | Disposition: A | Discharge status: Home / Self Care | Attending: Radiation Oncology | Admitting: Radiation Oncology

## 2017-01-25 DIAGNOSIS — C3402 Malignant neoplasm of left main bronchus: Principal | ICD-10-CM

## 2017-01-25 DIAGNOSIS — C50912 Malignant neoplasm of unspecified site of left female breast: Secondary | ICD-10-CM

## 2017-01-25 DIAGNOSIS — C3480 Malignant neoplasm of overlapping sites of unspecified bronchus and lung: Secondary | ICD-10-CM

## 2017-01-25 DIAGNOSIS — C3432 Malignant neoplasm of lower lobe, left bronchus or lung: Principal | ICD-10-CM

## 2017-01-25 DIAGNOSIS — Z170 Estrogen receptor positive status [ER+]: Secondary | ICD-10-CM

## 2017-01-25 DIAGNOSIS — R590 Localized enlarged lymph nodes: Secondary | ICD-10-CM | POA: Diagnosis not present

## 2017-01-25 DIAGNOSIS — Z510 Encounter for antineoplastic radiation therapy: Secondary | ICD-10-CM | POA: Diagnosis not present

## 2017-01-25 DIAGNOSIS — Z79890 Hormone replacement therapy (postmenopausal): Secondary | ICD-10-CM | POA: Diagnosis not present

## 2017-01-25 DIAGNOSIS — N6320 Unspecified lump in the left breast, unspecified quadrant: Secondary | ICD-10-CM | POA: Diagnosis not present

## 2017-02-21 ENCOUNTER — Telehealth: Payer: Self-pay | Admitting: Internal Medicine

## 2017-02-21 MED ORDER — EZETIMIBE 10 MG PO TABS: 10.0000 mg | ORAL_TABLET | Freq: Every day | ORAL | 0 refills | Status: DC

## 2017-02-21 NOTE — Telephone Encounter (Signed)
Per office policy sent 30 day to local pharmacy until appt.../lmb  

## 2017-02-21 NOTE — Telephone Encounter (Signed)
Pt called for a refill of her  ezetimibe (ZETIA) 10 MG tablet  Please advise  appt set up for 12/3 [facility] File

## 2017-02-28 ENCOUNTER — Encounter: Payer: Self-pay | Admitting: Internal Medicine

## 2017-02-28 ENCOUNTER — Ambulatory Visit (INDEPENDENT_AMBULATORY_CARE_PROVIDER_SITE_OTHER): Payer: Medicare Other | Admitting: Internal Medicine

## 2017-02-28 VITALS — BP 100/70 | HR 88 | Temp 98.4°F | Ht 63.0 in | Wt 132.0 lb

## 2017-02-28 DIAGNOSIS — E785 Hyperlipidemia, unspecified: Secondary | ICD-10-CM

## 2017-02-28 DIAGNOSIS — E2839 Other primary ovarian failure: Secondary | ICD-10-CM | POA: Diagnosis not present

## 2017-02-28 DIAGNOSIS — I10 Essential (primary) hypertension: Secondary | ICD-10-CM | POA: Diagnosis not present

## 2017-02-28 DIAGNOSIS — E119 Type 2 diabetes mellitus without complications: Secondary | ICD-10-CM

## 2017-02-28 MED ORDER — AMLODIPINE BESYLATE 5 MG PO TABS: 5.0000 mg | ORAL_TABLET | Freq: Every day | ORAL | 3 refills | Status: DC

## 2017-02-28 NOTE — Assessment & Plan Note (Addendum)
Though little to no symptoms,l I suspect is overcontrolled, to d/c diovan HCT as has been recalled as well, start amlodipine 5 qd, o/w stable overall by history and exam, recent data reviewed with pt,  to f/u any worsening symptoms or concerns BP Readings from Last 3 Encounters:  02/28/17 100/70  11/08/16 (!) 104/58  04/28/16 137/66

## 2017-02-28 NOTE — Assessment & Plan Note (Signed)
stable overall by history and exam, recent data reviewed with pt, and pt to continue medical treatment as before,  to f/u any worsening symptoms or concerns, for f/u labs, goal LDL < 70

## 2017-02-28 NOTE — Patient Instructions (Addendum)
Please schedule the bone density test before leaving today at the scheduling desk (where you check out)  oK to stop the Diovan HCT as this appears to be too much  Please take all new medication as prescribed - the amlodipine 5 mg per day  Please continue all other medications as before, and refills have been done if requested.  Please have the pharmacy call with any other refills you may need.  Please continue your efforts at being more active, low cholesterol diet, and weight control.  You are otherwise up to date with prevention measures today.  Please keep your appointments with your specialists as you may have planned  Please go to the LAB in the Basement (turn left off the elevator) for the tests to be done today  You will be contacted by phone if any changes need to be made immediately.  Otherwise, you will receive a letter about your results with an explanation, but please check with MyChart first.  Please remember to sign up for MyChart if you have not done so, as this will be important to you in the future with finding out test results, communicating by private email, and scheduling acute appointments online when needed.  Please return in 6 months, or sooner if needed

## 2017-02-28 NOTE — Assessment & Plan Note (Signed)
stable overall by history and exam, recent data reviewed with pt, and pt to continue medical treatment as before,  to f/u any worsening symptoms or concerns Lab Results  Component Value Date   HGBA1C 6.3 03/09/2016

## 2017-02-28 NOTE — Progress Notes (Signed)
Subjective:    Patient ID: Misty [name], female    DOB: 1940/05/02, 76 y.o.   MRN: [id number]  HPI  Here for yearly f/u;  Overall doing ok;  Pt denies Chest pain, worsening SOB, DOE, wheezing, orthopnea, PND, worsening LE edema, palpitations, dizziness or syncope.  Pt denies neurological change such as new headache, facial or extremity weakness.  Pt denies polydipsia, polyuria, or low sugar symptoms. Pt states overall good compliance with treatment and medications, good tolerability, and has been trying to follow appropriate diet.  Pt denies worsening depressive symptoms, suicidal ideation or panic. No fever,   Pt states good ability with ADL's, has low fall risk, home safety reviewed and adequate, no other significant changes in hearing or vision, and not active with exercise. Wt Readings from Last 3 Encounters:  02/28/17 132 lb (59.9 kg)  11/08/16 138 lb (62.6 kg)  04/28/16 149 lb 8 oz (67.8 kg)  Declines flu shot and optho referral.   Past Medical History:  Diagnosis Date  . Acute bronchitis 07/06/2008  . ALLERGIC RHINITIS 02/12/2008  . ANEMIA-IRON DEFICIENCY 02/12/2008  . Cancer of lower-inner quadrant of left female breast ([place]) 04/23/2016  . DIABETES MELLITUS, TYPE II 02/12/2008  . Facial cellulitis 01/01/2014  . FATIGUE 02/12/2008  . HYPERLIPIDEMIA 02/12/2008  . HYPERTENSION 11/05/2006  . KNEE PAIN, LEFT, ACUTE 05/11/2010  . LOW BACK PAIN 02/12/2008  . OSTEOPENIA 02/12/2008  . PEPTIC ULCER DISEASE 02/12/2008  . Shingles outbreak 01/02/2014  . SINUSITIS- ACUTE-NOS 10/08/2008   Past Surgical History:  Procedure Laterality Date  . ABDOMINAL HYSTERECTOMY    . APPENDECTOMY    . ESOPHAGOGASTRODUODENOSCOPY  1970    reports that she has quit smoking. she has never used smokeless tobacco. She reports that she drinks alcohol. She reports that she does not use drugs. family history includes Colon polyps (age of onset: 38) in her brother; Coronary artery disease in her brother; Depression  in her child; Hypertension in her brother; Mental illness in her other; Throat cancer in her other; Thyroid cancer in her brother. Allergies  Allergen Reactions  . Lipitor [Atorvastatin Calcium] Nausea Only  . Lovastatin Nausea Only  . Simvastatin Other (See Comments)    gi upset  . Tramadol Other (See Comments)    sleepiness   Current Outpatient Medications on File Prior to Visit  Medication Sig Dispense Refill  . aspirin EC 81 MG tablet Take 81 mg by mouth daily with breakfast.    . ezetimibe (ZETIA) 10 MG tablet Take 1 tablet (10 mg total) by mouth daily. Must keep scheduled appt for future refills 30 tablet 0  . letrozole (FEMARA) 2.5 MG tablet Take 1 tablet (2.5 mg total) by mouth daily. 90 tablet 3   No current facility-administered medications on file prior to visit.    Review of Systems Constitutional: Negative for other unusual diaphoresis, sweats, appetite or weight changes HENT: Negative for other worsening hearing loss, ear pain, facial swelling, mouth sores or neck stiffness.   Eyes: Negative for other worsening pain, redness or other visual disturbance.  Respiratory: Negative for other stridor or swelling Cardiovascular: Negative for other palpitations or other chest pain  Gastrointestinal: Negative for worsening diarrhea or loose stools, blood in stool, distention or other pain Genitourinary: Negative for hematuria, flank pain or other change in urine volume.  Musculoskeletal: Negative for myalgias or other joint swelling.  Skin: Negative for other color change, or other wound or worsening drainage.  Neurological: Negative for other syncope or  numbness. Hematological: Negative for other adenopathy or swelling Psychiatric/Behavioral: Negative for hallucinations, other worsening agitation, SI, self-injury, or new decreased concentration All other system neg per pt    Objective:   Physical Exam BP 100/70   Pulse 88   Temp 98.4 F (36.9 C) (Oral)   Ht 5\' 3"  (1.6 m)    Wt 132 lb (59.9 kg)   SpO2 97%   BMI 23.38 kg/m  VS noted, thinner/lost wt Constitutional: Pt is oriented to person, place, and time. Appears well-developed and well-nourished, in no significant distress and comfortable Head: Normocephalic and atraumatic  Eyes: Conjunctivae and EOM are normal. Pupils are equal, round, and reactive to light Right Ear: External ear normal without discharge Left Ear: External ear normal without discharge Nose: Nose without discharge or deformity Mouth/Throat: Oropharynx is without other ulcerations and moist  Neck: Normal range of motion. Neck supple. No JVD present. No tracheal deviation present or significant neck LA or mass Cardiovascular: Normal rate, regular rhythm, normal heart sounds and intact distal pulses.   Pulmonary/Chest: WOB normal and breath sounds without rales or wheezing  Abdominal: Soft. Bowel sounds are normal. NT. No HSM  Musculoskeletal: Normal range of motion. Exhibits no edema Lymphadenopathy: Has no other cervical adenopathy.  Neurological: Pt is alert and oriented to person, place, and time. Pt has normal reflexes. No cranial nerve deficit. Motor grossly intact, Gait intact Skin: Skin is warm and dry. No rash noted or new ulcerations Psychiatric:  Has normal mood and affect. Behavior is normal without agitation No other exam findings    Assessment & Plan:

## 2017-03-04 ENCOUNTER — Ambulatory Visit
Admission: RE | Admit: 2017-03-04 | Discharge: 2017-03-04 | Disposition: A | Discharge status: Home / Self Care | Payer: MEDICARE | Attending: Adult Health | Admitting: Adult Health

## 2017-03-04 DIAGNOSIS — Z170 Estrogen receptor positive status [ER+]: Secondary | ICD-10-CM

## 2017-03-04 DIAGNOSIS — C50912 Malignant neoplasm of unspecified site of left female breast: Principal | ICD-10-CM

## 2017-03-04 MED ORDER — TAMOXIFEN 20 MG TABLET
ORAL_TABLET | Freq: Every day | ORAL | 3 refills | 0 days | Status: CP
Start: 2017-03-04 — End: 2017-05-17

## 2017-03-07 ENCOUNTER — Inpatient Hospital Stay: Admission: RE | Admit: 2017-03-07 | Payer: Medicare Other | Source: Ambulatory Visit

## 2017-03-16 ENCOUNTER — Other Ambulatory Visit: Payer: Self-pay | Admitting: Internal Medicine

## 2017-03-26 ENCOUNTER — Other Ambulatory Visit: Payer: Self-pay | Admitting: Internal Medicine

## 2017-03-30 ENCOUNTER — Ambulatory Visit: Admit: 2017-03-30 | Discharge: 2017-04-28 | Payer: MEDICARE

## 2017-03-30 ENCOUNTER — Ambulatory Visit
Admit: 2017-03-30 | Discharge: 2017-04-28 | Payer: MEDICARE | Attending: Radiation Oncology | Primary: Radiation Oncology

## 2017-03-30 DIAGNOSIS — C3402 Malignant neoplasm of left main bronchus: Principal | ICD-10-CM

## 2017-04-08 ENCOUNTER — Ambulatory Visit: Admit: 2017-04-08 | Discharge: 2017-04-09 | Payer: MEDICARE

## 2017-04-08 ENCOUNTER — Ambulatory Visit
Admit: 2017-04-08 | Discharge: 2017-04-09 | Payer: MEDICARE | Attending: Geriatric Medicine | Primary: Geriatric Medicine

## 2017-04-08 DIAGNOSIS — Z170 Estrogen receptor positive status [ER+]: Secondary | ICD-10-CM

## 2017-04-08 DIAGNOSIS — C50812 Malignant neoplasm of overlapping sites of left female breast: Principal | ICD-10-CM

## 2017-04-08 DIAGNOSIS — C50912 Malignant neoplasm of unspecified site of left female breast: Principal | ICD-10-CM

## 2017-04-08 DIAGNOSIS — C3402 Malignant neoplasm of left main bronchus: Secondary | ICD-10-CM

## 2017-04-12 DIAGNOSIS — C50812 Malignant neoplasm of overlapping sites of left female breast: Principal | ICD-10-CM

## 2017-04-12 DIAGNOSIS — Z170 Estrogen receptor positive status [ER+]: Secondary | ICD-10-CM

## 2017-04-14 ENCOUNTER — Ambulatory Visit: Admit: 2017-04-14 | Discharge: 2017-04-15 | Payer: MEDICARE | Attending: Adult Health | Primary: Adult Health

## 2017-04-14 DIAGNOSIS — C50812 Malignant neoplasm of overlapping sites of left female breast: Principal | ICD-10-CM

## 2017-04-14 DIAGNOSIS — Z170 Estrogen receptor positive status [ER+]: Secondary | ICD-10-CM

## 2017-04-25 DIAGNOSIS — C50312 Malignant neoplasm of lower-inner quadrant of left female breast: Principal | ICD-10-CM

## 2017-04-26 ENCOUNTER — Ambulatory Visit
Admit: 2017-04-26 | Discharge: 2017-04-27 | Disposition: A | Discharge status: Home Health | Payer: MEDICARE | Admitting: Surgical Oncology

## 2017-04-26 ENCOUNTER — Encounter
Admit: 2017-04-26 | Discharge: 2017-04-27 | Disposition: A | Discharge status: Home Health | Payer: MEDICARE | Attending: Anesthesiology | Admitting: Surgical Oncology

## 2017-04-26 DIAGNOSIS — C50312 Malignant neoplasm of lower-inner quadrant of left female breast: Principal | ICD-10-CM

## 2017-04-27 MED ORDER — GENERIC EXTERNAL MEDICATION
Status: DC
Start: ? — End: 2017-04-27

## 2017-04-27 MED ORDER — TAMOXIFEN CITRATE 10 MG PO TABS
20.00 mg | ORAL_TABLET | ORAL | Status: DC
Start: 2017-04-28 — End: 2017-04-27

## 2017-04-27 MED ORDER — BUPIVACAINE LIPOSOME IJ
1.00 | INTRAMUSCULAR | Status: DC
Start: ? — End: 2017-04-27

## 2017-04-27 MED ORDER — ACETAMINOPHEN 325 MG PO TABS
650.00 mg | ORAL_TABLET | ORAL | Status: DC
Start: 2017-04-27 — End: 2017-04-27

## 2017-04-27 MED ORDER — ONDANSETRON 4 MG PO TBDP
4.00 mg | ORAL_TABLET | ORAL | Status: DC
Start: ? — End: 2017-04-27

## 2017-04-27 MED ORDER — DOCUSATE SODIUM 100 MG PO CAPS
100.00 mg | ORAL_CAPSULE | ORAL | Status: DC
Start: 2017-04-27 — End: 2017-04-27

## 2017-04-27 MED ORDER — AMLODIPINE BESYLATE 5 MG PO TABS
5.00 mg | ORAL_TABLET | ORAL | Status: DC
Start: 2017-04-28 — End: 2017-04-27

## 2017-04-27 MED ORDER — OXYCODONE 5 MG TABLET
ORAL_TABLET | ORAL | 0 refills | 0.00000 days | Status: CP | PRN
Start: 2017-04-27 — End: 2017-05-02

## 2017-04-28 ENCOUNTER — Telehealth: Payer: Self-pay | Admitting: Internal Medicine

## 2017-04-28 MED ORDER — VALSARTAN-HYDROCHLOROTHIAZIDE 320-25 MG PO TABS: 1.0000 | ORAL_TABLET | Freq: Every day | ORAL | 1 refills | Status: AC

## 2017-04-28 NOTE — Telephone Encounter (Signed)
Ok for verbal 

## 2017-04-28 NOTE — Telephone Encounter (Signed)
Resent rx to [facility]lmb

## 2017-04-28 NOTE — Telephone Encounter (Signed)
Copied from [place] [phone]. Topic: General - Other >> Apr 28, 2017 12:00 PM Carolyn Stare wrote: Pt threw the meds below away and need a rx sent to the pharmacy    valsartan-hydrochlorothiazide (DIOVAN-HCT) 320-25 MG tablet    amlodipine   [facility]

## 2017-04-28 NOTE — Telephone Encounter (Signed)
Copied from [place] [phone]. Topic: General - Other >> Apr 28, 2017  2:45 PM Darl Householder, RMA wrote: Reason for CRM: [employer] from [facility] is requesting verbal orders for skilled nursing once a week x4 weeks, please return call at [phone]

## 2017-04-29 ENCOUNTER — Telehealth: Payer: Self-pay | Admitting: Internal Medicine

## 2017-04-29 NOTE — Telephone Encounter (Signed)
Copied from [place] [phone]. Topic: Quick Communication - See Telephone Encounter >> Apr 29, 2017  1:35 PM Burnis Medin, NT wrote: CRM for notification. See Telephone encounter for: Thomasenia Bottoms is calling for verbal orders for patient for  PT one time a week for 1 week and twice a week for 3 weeks and 1 time a week for 1 week. Also to add an  OT evaluation. He would like a call back.   04/29/17.

## 2017-04-29 NOTE — Telephone Encounter (Signed)
Verbals orders given via VM

## 2017-04-29 NOTE — Telephone Encounter (Signed)
Ok for verbals 

## 2017-04-29 NOTE — Telephone Encounter (Signed)
Notified [employer] w/ MD response.Marland KitchenJohny Chess

## 2017-05-03 ENCOUNTER — Encounter: Payer: Self-pay | Admitting: Internal Medicine

## 2017-05-03 ENCOUNTER — Ambulatory Visit (INDEPENDENT_AMBULATORY_CARE_PROVIDER_SITE_OTHER): Payer: Medicare Other | Admitting: Internal Medicine

## 2017-05-03 ENCOUNTER — Other Ambulatory Visit (INDEPENDENT_AMBULATORY_CARE_PROVIDER_SITE_OTHER): Payer: Medicare Other

## 2017-05-03 VITALS — BP 112/72 | HR 89 | Temp 98.4°F | Ht 63.0 in | Wt 124.0 lb

## 2017-05-03 DIAGNOSIS — I10 Essential (primary) hypertension: Secondary | ICD-10-CM | POA: Diagnosis not present

## 2017-05-03 DIAGNOSIS — Z86010 Personal history of colonic polyps: Secondary | ICD-10-CM

## 2017-05-03 DIAGNOSIS — E119 Type 2 diabetes mellitus without complications: Secondary | ICD-10-CM | POA: Diagnosis not present

## 2017-05-03 DIAGNOSIS — E785 Hyperlipidemia, unspecified: Secondary | ICD-10-CM

## 2017-05-03 DIAGNOSIS — E2839 Other primary ovarian failure: Secondary | ICD-10-CM

## 2017-05-03 DIAGNOSIS — I83819 Varicose veins of unspecified lower extremities with pain: Secondary | ICD-10-CM

## 2017-05-03 LAB — BASIC METABOLIC PANEL
BUN: 25 mg/dL — ABNORMAL HIGH (ref 6–23)
CALCIUM: 9.2 mg/dL (ref 8.4–10.5)
CO2: 30 mEq/L (ref 19–32)
Chloride: 100 mEq/L (ref 96–112)
Creatinine, Ser: 0.89 mg/dL (ref 0.40–1.20)
GFR: 65.37 mL/min (ref >60.00)
GLUCOSE: 112 mg/dL — AB (ref 70–99)
Potassium: 3.8 mEq/L (ref 3.5–5.1)
SODIUM: 139 meq/L (ref 135–145)

## 2017-05-03 LAB — HEPATIC FUNCTION PANEL
ALBUMIN: 3.6 g/dL (ref 3.5–5.2)
ALT: 12 U/L (ref 0–35)
AST: 14 U/L (ref 0–37)
Alkaline Phosphatase: 62 U/L (ref 39–117)
Bilirubin, Direct: 0 mg/dL (ref 0.0–0.3)
TOTAL PROTEIN: 7.3 g/dL (ref 6.0–8.3)
Total Bilirubin: 0.4 mg/dL (ref 0.2–1.2)

## 2017-05-03 LAB — LIPID PANEL
CHOLESTEROL: 159 mg/dL (ref 0–200)
HDL: 42.5 mg/dL (ref >39.00)
LDL CALC: 80 mg/dL (ref 0–99)
NonHDL: 116.98
TRIGLYCERIDES: 187 mg/dL — AB (ref 0.0–149.0)
Total CHOL/HDL Ratio: 4
VLDL: 37.4 mg/dL (ref 0.0–40.0)

## 2017-05-03 LAB — URINALYSIS, ROUTINE W REFLEX MICROSCOPIC
Bilirubin Urine: NEGATIVE
Hgb urine dipstick: NEGATIVE
Ketones, ur: NEGATIVE
Leukocytes, UA: NEGATIVE
Nitrite: NEGATIVE
PH: 6 (ref 5.0–8.0)
RBC / HPF: NONE SEEN (ref 0–?)
Specific Gravity, Urine: 1.02 (ref 1.000–1.030)
TOTAL PROTEIN, URINE-UPE24: NEGATIVE
URINE GLUCOSE: NEGATIVE
UROBILINOGEN UA: 0.2 (ref 0.0–1.0)

## 2017-05-03 LAB — TSH: TSH: 1.52 u[IU]/mL (ref 0.35–4.50)

## 2017-05-03 NOTE — Patient Instructions (Addendum)
Please schedule the bone density test before leaving today at the scheduling desk (where you check out)  You will be contacted regarding the referral for: vein clinic  Please remember to call about your yearly [facility] exam at [facility]  You will be contacted regarding the referral for: colonoscopy  Please continue all other medications as before, and refills have been done if requested.  Please have the pharmacy call with any other refills you may need.  Please continue your efforts at being more active, low cholesterol diet, and weight control.  You are otherwise up to date with prevention measures today.  Please keep your appointments with your specialists as you may have planned  Please go to the LAB in the Basement (turn left off the elevator) for the tests to be done today  You will be contacted by phone if any changes need to be made immediately.  Otherwise, you will receive a letter about your results with an explanation, but please check with MyChart first.  Please remember to sign up for MyChart if you have not done so, as this will be important to you in the future with finding out test results, communicating by private email, and scheduling acute appointments online when needed.  Please return in 6 months, or sooner if needed

## 2017-05-03 NOTE — Progress Notes (Signed)
Subjective:    Patient ID: Misty [name], female    DOB: [birth year], 77 y.o.   MRN: [id number]  HPI Here for yearly f/u;  Overall doing ok;  Pt denies Chest pain, worsening SOB, DOE, wheezing, orthopnea, PND, worsening LE edema, palpitations, dizziness or syncope.  Pt denies neurological change such as new headache, facial or extremity weakness.  Pt denies polydipsia, polyuria, or low sugar symptoms. Pt states overall good compliance with treatment and medications, good tolerability, and has been trying to follow appropriate diet.  Pt denies worsening depressive symptoms, suicidal ideation or panic. No fever, night sweats, wt loss, loss of appetite, or other constitutional symptoms.  Pt states good ability with ADL's, has low fall risk, home safety reviewed and adequate, no other significant changes in hearing or vision, and not active with exercise. Also Just s/p left radical mastectomy at [facility] about 1 wk ago, drains looking much less red today, smaller amounts.  Taking tylenol only for pain. Due to start local XRT soon. 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   Due for DXA as well Past Medical History:  Diagnosis Date  . Acute bronchitis 07/06/2008  . ALLERGIC RHINITIS 02/12/2008  . ANEMIA-IRON DEFICIENCY 02/12/2008  . Cancer of lower-inner quadrant of left female breast ([place]) 04/23/2016  . DIABETES MELLITUS, TYPE II 02/12/2008  . Facial cellulitis 01/01/2014  . FATIGUE 02/12/2008  . HYPERLIPIDEMIA 02/12/2008  . HYPERTENSION 11/05/2006  . KNEE PAIN, LEFT, ACUTE 05/11/2010  . LOW BACK PAIN 02/12/2008  . OSTEOPENIA 02/12/2008  . PEPTIC ULCER DISEASE 02/12/2008  . Shingles outbreak 01/02/2014  . SINUSITIS- ACUTE-NOS 10/08/2008   Past Surgical History:  Procedure Laterality Date  . ABDOMINAL HYSTERECTOMY    . APPENDECTOMY    . ESOPHAGOGASTRODUODENOSCOPY  1970    reports that she has quit smoking. she has never  used smokeless tobacco. She reports that she drinks alcohol. She reports that she does not use drugs. family history includes Colon polyps (age of onset: 71) in her brother; Coronary artery disease in her brother; Depression in her child; Hypertension in her brother; Mental illness in her other; Throat cancer in her other; Thyroid cancer in her brother. Allergies  Allergen Reactions  . Lipitor [Atorvastatin Calcium] Nausea Only  . Lovastatin Nausea Only  . Simvastatin Other (See Comments)    gi upset  . Tramadol Other (See Comments)    sleepiness   Current Outpatient Medications on File Prior to Visit  Medication Sig Dispense Refill  . amLODipine (NORVASC) 5 MG tablet Take 1 tablet (5 mg total) by mouth daily. 90 tablet 3  . aspirin EC 81 MG tablet Take 81 mg by mouth daily with breakfast.    . ezetimibe (ZETIA) 10 MG tablet TAKE 1 TABLET (10 MG TOTAL) BY MOUTH DAILY. MUST KEEP SCHEDULED APPT FOR FUTURE REFILLS 30 tablet 5  . letrozole (FEMARA) 2.5 MG tablet Take 1 tablet (2.5 mg total) by mouth daily. 90 tablet 3  . valsartan-hydrochlorothiazide (DIOVAN-HCT) 320-25 MG tablet Take 1 tablet by mouth daily. 90 tablet 1   No current facility-administered medications on file prior to visit.    Review of Systems Constitutional: Negative for other unusual diaphoresis, sweats, appetite or weight changes HENT: Negative for other worsening hearing loss, ear pain, facial swelling, mouth sores or neck stiffness.   Eyes: Negative for other worsening pain, redness or other visual disturbance.  Respiratory: Negative for other stridor  or swelling Cardiovascular: Negative for other palpitations or other chest pain  Gastrointestinal: Negative for worsening diarrhea or loose stools, blood in stool, distention or other pain Genitourinary: Negative for hematuria, flank pain or other change in urine volume.  Musculoskeletal: Negative for myalgias or other joint swelling.  Skin: Negative for other color  change, or other wound or worsening drainage.  Neurological: Negative for other syncope or numbness. Hematological: Negative for other adenopathy or swelling Psychiatric/Behavioral: Negative for hallucinations, other worsening agitation, SI, self-injury, or new decreased concentration All other system neg per pt    Objective:   Physical Exam BP 112/72   Pulse 89   Temp 98.4 F (36.9 C) (Oral)   Ht 5\' 3"  (1.6 m)   Wt 124 lb (56.2 kg)   SpO2 97%   BMI 21.97 kg/m  VS noted,  Constitutional: Pt is oriented to person, place, and time. Appears well-developed and well-nourished, in no significant distress and comfortable Head: Normocephalic and atraumatic  Eyes: Conjunctivae and EOM are normal. Pupils are equal, round, and reactive to light Right Ear: External ear normal without discharge Left Ear: External ear normal without discharge Nose: Nose without discharge or deformity Mouth/Throat: Oropharynx is without other ulcerations and moist  Neck: Normal range of motion. Neck supple. No JVD present. No tracheal deviation present or significant neck LA or mass Cardiovascular: Normal rate, regular rhythm, normal heart sounds and intact distal pulses.   Pulmonary/Chest: WOB normal and breath sounds without rales or wheezing  Abdominal: Soft. Bowel sounds are normal. NT. No HSM  Musculoskeletal: Normal range of motion. Exhibits no edema Lymphadenopathy: Has no other cervical adenopathy.  Neurological: Pt is alert and oriented to person, place, and time. Pt has normal reflexes. No cranial nerve deficit. Motor grossly intact, Gait intact Skin: Skin is warm and dry. No rash noted or new ulcerations Psychiatric:  Has normal mood and affect. Behavior is normal without agitation No other exam findings    Assessment & Plan:

## 2017-05-04 DIAGNOSIS — Z86010 Personal history of colonic polyps: Secondary | ICD-10-CM | POA: Insufficient documentation

## 2017-05-04 NOTE — Assessment & Plan Note (Signed)
stable overall by history and exam, recent data reviewed with pt, and pt to continue medical treatment as before,  to f/u any worsening symptoms or concerns BP Readings from Last 3 Encounters:  05/03/17 112/72  02/28/17 100/70  11/08/16 (!) 104/58

## 2017-05-04 NOTE — Assessment & Plan Note (Signed)
Lab Results  Component Value Date   HGBA1C 6.3 03/09/2016  stable overall by history and exam, recent data reviewed with pt, and pt to continue medical treatment as before,  to f/u any worsening symptoms or concerns

## 2017-05-04 NOTE — Assessment & Plan Note (Signed)
Lab Results  Component Value Date   LDLCALC 80 05/03/2017  stable overall by history and exam, recent data reviewed with pt, and pt to continue medical treatment as before,  to f/u any worsening symptoms or concerns

## 2017-05-04 NOTE — Assessment & Plan Note (Signed)
Due for colonoscopy - will refer

## 2017-05-04 NOTE — Assessment & Plan Note (Signed)
For vein clinic referral

## 2017-05-05 ENCOUNTER — Ambulatory Visit: Admit: 2017-05-05 | Discharge: 2017-05-06 | Payer: MEDICARE | Attending: Surgical Oncology | Primary: Surgical Oncology

## 2017-05-05 DIAGNOSIS — Z170 Estrogen receptor positive status [ER+]: Secondary | ICD-10-CM

## 2017-05-05 DIAGNOSIS — C50812 Malignant neoplasm of overlapping sites of left female breast: Principal | ICD-10-CM

## 2017-05-10 ENCOUNTER — Ambulatory Visit: Payer: Medicare Other | Admitting: Internal Medicine

## 2017-05-12 ENCOUNTER — Ambulatory Visit
Admit: 2017-05-12 | Discharge: 2017-05-13 | Payer: MEDICARE | Attending: Nurse Practitioner | Primary: Nurse Practitioner

## 2017-05-12 DIAGNOSIS — C50912 Malignant neoplasm of unspecified site of left female breast: Principal | ICD-10-CM

## 2017-05-17 ENCOUNTER — Encounter: Payer: Medicare Other | Admitting: Vascular Surgery

## 2017-05-17 ENCOUNTER — Ambulatory Visit
Admit: 2017-05-17 | Discharge: 2017-05-26 | Payer: MEDICARE | Attending: Radiation Oncology | Primary: Radiation Oncology

## 2017-05-17 ENCOUNTER — Ambulatory Visit: Admit: 2017-05-17 | Discharge: 2017-05-26 | Payer: MEDICARE

## 2017-05-17 DIAGNOSIS — Z170 Estrogen receptor positive status [ER+]: Secondary | ICD-10-CM

## 2017-05-17 DIAGNOSIS — C50812 Malignant neoplasm of overlapping sites of left female breast: Principal | ICD-10-CM

## 2017-05-27 ENCOUNTER — Ambulatory Visit
Admit: 2017-05-27 | Discharge: 2017-06-26 | Payer: MEDICARE | Attending: Radiation Oncology | Primary: Radiation Oncology

## 2017-05-27 ENCOUNTER — Ambulatory Visit: Admit: 2017-05-27 | Discharge: 2017-06-26 | Payer: MEDICARE

## 2017-05-27 DIAGNOSIS — C3402 Malignant neoplasm of left main bronchus: Principal | ICD-10-CM

## 2017-05-31 DIAGNOSIS — C50812 Malignant neoplasm of overlapping sites of left female breast: Principal | ICD-10-CM

## 2017-05-31 DIAGNOSIS — Z170 Estrogen receptor positive status [ER+]: Secondary | ICD-10-CM

## 2017-06-02 ENCOUNTER — Ambulatory Visit
Admit: 2017-06-02 | Discharge: 2017-06-03 | Payer: MEDICARE | Attending: Geriatric Medicine | Primary: Geriatric Medicine

## 2017-06-02 DIAGNOSIS — C50912 Malignant neoplasm of unspecified site of left female breast: Principal | ICD-10-CM

## 2017-06-02 DIAGNOSIS — Z5111 Encounter for antineoplastic chemotherapy: Secondary | ICD-10-CM

## 2017-06-02 DIAGNOSIS — Z170 Estrogen receptor positive status [ER+]: Secondary | ICD-10-CM

## 2017-06-02 DIAGNOSIS — Z9012 Acquired absence of left breast and nipple: Secondary | ICD-10-CM

## 2017-06-02 DIAGNOSIS — C3402 Malignant neoplasm of left main bronchus: Secondary | ICD-10-CM

## 2017-06-02 MED ORDER — ONDANSETRON HCL 8 MG TABLET
ORAL_TABLET | Freq: Three times a day (TID) | ORAL | 2 refills | 0 days | Status: CP | PRN
Start: 2017-06-02 — End: 2017-08-02

## 2017-06-02 MED ORDER — PROCHLORPERAZINE MALEATE 10 MG TABLET
ORAL_TABLET | Freq: Four times a day (QID) | ORAL | 2 refills | 0 days | Status: CP | PRN
Start: 2017-06-02 — End: 2017-08-02

## 2017-06-09 ENCOUNTER — Encounter: Payer: Self-pay | Admitting: Gastroenterology

## 2017-06-09 ENCOUNTER — Ambulatory Visit: Admit: 2017-06-09 | Discharge: 2017-06-10 | Payer: MEDICARE

## 2017-06-09 DIAGNOSIS — C3402 Malignant neoplasm of left main bronchus: Principal | ICD-10-CM

## 2017-06-09 DIAGNOSIS — Z170 Estrogen receptor positive status [ER+]: Secondary | ICD-10-CM

## 2017-06-09 DIAGNOSIS — C50812 Malignant neoplasm of overlapping sites of left female breast: Secondary | ICD-10-CM

## 2017-06-14 DIAGNOSIS — C3402 Malignant neoplasm of left main bronchus: Principal | ICD-10-CM

## 2017-06-15 ENCOUNTER — Ambulatory Visit: Admit: 2017-06-15 | Discharge: 2017-06-16

## 2017-06-16 ENCOUNTER — Ambulatory Visit: Admit: 2017-06-16 | Discharge: 2017-06-17

## 2017-06-17 ENCOUNTER — Ambulatory Visit: Admit: 2017-06-17 | Discharge: 2017-06-18

## 2017-06-20 ENCOUNTER — Ambulatory Visit: Admit: 2017-06-20 | Discharge: 2017-06-21

## 2017-06-21 ENCOUNTER — Ambulatory Visit: Admit: 2017-06-21 | Discharge: 2017-06-22

## 2017-06-21 DIAGNOSIS — Z170 Estrogen receptor positive status [ER+]: Secondary | ICD-10-CM

## 2017-06-21 DIAGNOSIS — C50812 Malignant neoplasm of overlapping sites of left female breast: Principal | ICD-10-CM

## 2017-06-22 ENCOUNTER — Ambulatory Visit: Admit: 2017-06-22 | Discharge: 2017-06-23

## 2017-06-23 ENCOUNTER — Ambulatory Visit: Admit: 2017-06-23 | Discharge: 2017-06-24

## 2017-06-23 DIAGNOSIS — C3402 Malignant neoplasm of left main bronchus: Principal | ICD-10-CM

## 2017-06-24 ENCOUNTER — Ambulatory Visit: Admit: 2017-06-24 | Discharge: 2017-06-25

## 2017-06-27 ENCOUNTER — Ambulatory Visit
Admit: 2017-06-27 | Discharge: 2017-07-26 | Payer: MEDICARE | Attending: Radiation Oncology | Primary: Radiation Oncology

## 2017-06-27 ENCOUNTER — Ambulatory Visit: Admit: 2017-06-27 | Discharge: 2017-07-26 | Payer: MEDICARE

## 2017-06-27 ENCOUNTER — Ambulatory Visit: Admit: 2017-06-27 | Discharge: 2017-06-28

## 2017-06-27 DIAGNOSIS — C3402 Malignant neoplasm of left main bronchus: Principal | ICD-10-CM

## 2017-06-28 ENCOUNTER — Ambulatory Visit: Admit: 2017-06-28 | Discharge: 2017-06-29

## 2017-06-28 DIAGNOSIS — C50812 Malignant neoplasm of overlapping sites of left female breast: Principal | ICD-10-CM

## 2017-06-28 DIAGNOSIS — Z170 Estrogen receptor positive status [ER+]: Secondary | ICD-10-CM

## 2017-06-29 ENCOUNTER — Ambulatory Visit: Admit: 2017-06-29 | Discharge: 2017-06-30

## 2017-06-30 ENCOUNTER — Ambulatory Visit: Admit: 2017-06-30 | Discharge: 2017-07-01

## 2017-07-01 ENCOUNTER — Ambulatory Visit: Admit: 2017-07-01 | Discharge: 2017-07-02

## 2017-07-01 ENCOUNTER — Ambulatory Visit: Admit: 2017-07-01 | Discharge: 2017-07-02 | Attending: Radiation Oncology | Primary: Radiation Oncology

## 2017-07-04 ENCOUNTER — Ambulatory Visit: Admit: 2017-07-04 | Discharge: 2017-07-05

## 2017-07-05 ENCOUNTER — Ambulatory Visit: Admit: 2017-07-05 | Discharge: 2017-07-06

## 2017-07-05 DIAGNOSIS — Z170 Estrogen receptor positive status [ER+]: Secondary | ICD-10-CM

## 2017-07-05 DIAGNOSIS — C50812 Malignant neoplasm of overlapping sites of left female breast: Principal | ICD-10-CM

## 2017-07-06 ENCOUNTER — Ambulatory Visit: Admit: 2017-07-06 | Discharge: 2017-07-07

## 2017-07-07 ENCOUNTER — Ambulatory Visit: Admit: 2017-07-07 | Discharge: 2017-07-08

## 2017-07-08 ENCOUNTER — Ambulatory Visit: Admit: 2017-07-08 | Discharge: 2017-07-09

## 2017-07-11 ENCOUNTER — Ambulatory Visit: Admit: 2017-07-11 | Discharge: 2017-07-12

## 2017-07-12 ENCOUNTER — Ambulatory Visit: Admit: 2017-07-12 | Discharge: 2017-07-13

## 2017-07-12 DIAGNOSIS — C3402 Malignant neoplasm of left main bronchus: Principal | ICD-10-CM

## 2017-07-13 ENCOUNTER — Ambulatory Visit: Admit: 2017-07-13 | Discharge: 2017-07-14

## 2017-07-13 ENCOUNTER — Ambulatory Visit: Admit: 2017-07-13 | Discharge: 2017-07-14 | Attending: Radiation Oncology | Primary: Radiation Oncology

## 2017-07-14 ENCOUNTER — Ambulatory Visit: Admit: 2017-07-14 | Discharge: 2017-07-15

## 2017-07-15 ENCOUNTER — Ambulatory Visit: Admit: 2017-07-15 | Discharge: 2017-07-16

## 2017-07-18 ENCOUNTER — Ambulatory Visit: Admit: 2017-07-18 | Discharge: 2017-07-19

## 2017-07-19 ENCOUNTER — Ambulatory Visit: Admit: 2017-07-19 | Discharge: 2017-07-20

## 2017-07-19 DIAGNOSIS — Z170 Estrogen receptor positive status [ER+]: Secondary | ICD-10-CM

## 2017-07-19 DIAGNOSIS — C50812 Malignant neoplasm of overlapping sites of left female breast: Principal | ICD-10-CM

## 2017-07-20 ENCOUNTER — Encounter: Payer: Medicare Other | Admitting: Gastroenterology

## 2017-07-20 ENCOUNTER — Ambulatory Visit: Admit: 2017-07-20 | Discharge: 2017-07-21

## 2017-07-21 ENCOUNTER — Ambulatory Visit: Admit: 2017-07-21 | Discharge: 2017-07-22

## 2017-07-22 ENCOUNTER — Ambulatory Visit: Admit: 2017-07-22 | Discharge: 2017-07-23

## 2017-07-22 ENCOUNTER — Ambulatory Visit: Admit: 2017-07-22 | Discharge: 2017-07-23 | Attending: Radiation Oncology | Primary: Radiation Oncology

## 2017-07-25 ENCOUNTER — Ambulatory Visit: Admit: 2017-07-25 | Discharge: 2017-07-26

## 2017-07-26 ENCOUNTER — Ambulatory Visit: Admit: 2017-07-26 | Discharge: 2017-07-27

## 2017-07-26 DIAGNOSIS — C50812 Malignant neoplasm of overlapping sites of left female breast: Principal | ICD-10-CM

## 2017-07-26 DIAGNOSIS — Z170 Estrogen receptor positive status [ER+]: Secondary | ICD-10-CM

## 2017-07-26 MED ORDER — SILVER SULFADIAZINE 1 % TOPICAL CREAM
1 refills | 0 days | Status: CP
Start: 2017-07-26 — End: 2017-08-02

## 2017-07-27 ENCOUNTER — Ambulatory Visit
Admit: 2017-07-27 | Discharge: 2017-08-26 | Payer: MEDICARE | Attending: Radiation Oncology | Primary: Radiation Oncology

## 2017-07-27 ENCOUNTER — Ambulatory Visit: Admit: 2017-07-27 | Discharge: 2017-08-26 | Payer: MEDICARE

## 2017-07-27 ENCOUNTER — Ambulatory Visit: Admit: 2017-07-27 | Discharge: 2017-07-28

## 2017-07-27 DIAGNOSIS — Z170 Estrogen receptor positive status [ER+]: Secondary | ICD-10-CM

## 2017-07-27 DIAGNOSIS — C3480 Malignant neoplasm of overlapping sites of unspecified bronchus and lung: Secondary | ICD-10-CM

## 2017-07-27 DIAGNOSIS — C3402 Malignant neoplasm of left main bronchus: Principal | ICD-10-CM

## 2017-07-27 DIAGNOSIS — C50812 Malignant neoplasm of overlapping sites of left female breast: Principal | ICD-10-CM

## 2017-07-29 ENCOUNTER — Ambulatory Visit: Admit: 2017-07-29 | Discharge: 2017-07-30

## 2017-08-02 DIAGNOSIS — C50812 Malignant neoplasm of overlapping sites of left female breast: Principal | ICD-10-CM

## 2017-08-02 DIAGNOSIS — Z170 Estrogen receptor positive status [ER+]: Secondary | ICD-10-CM

## 2017-08-02 MED ORDER — SILVER SULFADIAZINE 1 % TOPICAL CREAM
1 refills | 0 days | Status: CP
Start: 2017-08-02 — End: 2017-08-04

## 2017-08-04 ENCOUNTER — Telehealth: Payer: Self-pay

## 2017-08-04 ENCOUNTER — Ambulatory Visit: Admit: 2017-08-04 | Discharge: 2017-08-04 | Payer: MEDICARE

## 2017-08-04 ENCOUNTER — Ambulatory Visit: Admit: 2017-08-04 | Discharge: 2017-08-04 | Payer: MEDICARE | Attending: Adult Health | Primary: Adult Health

## 2017-08-04 DIAGNOSIS — Z170 Estrogen receptor positive status [ER+]: Secondary | ICD-10-CM

## 2017-08-04 DIAGNOSIS — C50812 Malignant neoplasm of overlapping sites of left female breast: Principal | ICD-10-CM

## 2017-08-04 MED ORDER — SILVER SULFADIAZINE 1 % TOPICAL CREAM
0 refills | 0 days | Status: CP
Start: 2017-08-04 — End: 2017-08-04

## 2017-08-04 MED ORDER — ONDANSETRON HCL 8 MG TABLET
ORAL_TABLET | Freq: Three times a day (TID) | ORAL | 2 refills | 0 days | Status: CP | PRN
Start: 2017-08-04 — End: 2017-12-29

## 2017-08-04 MED ORDER — PROCHLORPERAZINE MALEATE 10 MG TABLET
ORAL_TABLET | Freq: Four times a day (QID) | ORAL | 2 refills | 0 days | Status: CP | PRN
Start: 2017-08-04 — End: 2017-12-29

## 2017-08-04 NOTE — Telephone Encounter (Signed)
Patient No Showed for Pre-Visit. A message was left on the two numbers that were given. Patient was instructed to call and reschedule her appointment before 5:00 Pm today. If we do not hear from her before 5:00 Pm her procedure that is scheduled on 08/18/17 will be canceled per [facility] protocol. A no show letter will also be mailed.   Riki Sheer, LPN ( [facility] )

## 2017-08-12 ENCOUNTER — Other Ambulatory Visit: Admit: 2017-08-12 | Discharge: 2017-08-13 | Payer: MEDICARE

## 2017-08-12 ENCOUNTER — Ambulatory Visit: Admit: 2017-08-12 | Discharge: 2017-08-13 | Payer: MEDICARE

## 2017-08-12 DIAGNOSIS — C3402 Malignant neoplasm of left main bronchus: Principal | ICD-10-CM

## 2017-08-18 ENCOUNTER — Encounter: Payer: Medicare Other | Admitting: Gastroenterology

## 2017-08-30 ENCOUNTER — Ambulatory Visit: Payer: Medicare Other | Admitting: Internal Medicine

## 2017-08-30 ENCOUNTER — Ambulatory Visit
Admit: 2017-08-30 | Discharge: 2017-09-25 | Payer: MEDICARE | Attending: Radiation Oncology | Primary: Radiation Oncology

## 2017-08-30 DIAGNOSIS — Z170 Estrogen receptor positive status [ER+]: Secondary | ICD-10-CM

## 2017-08-30 DIAGNOSIS — C50812 Malignant neoplasm of overlapping sites of left female breast: Principal | ICD-10-CM

## 2017-09-01 ENCOUNTER — Other Ambulatory Visit: Admit: 2017-09-01 | Discharge: 2017-09-01 | Payer: MEDICARE

## 2017-09-01 ENCOUNTER — Ambulatory Visit: Admit: 2017-09-01 | Discharge: 2017-09-01 | Payer: MEDICARE

## 2017-09-01 ENCOUNTER — Ambulatory Visit: Admit: 2017-09-01 | Discharge: 2017-09-01 | Payer: MEDICARE | Attending: Adult Health | Primary: Adult Health

## 2017-09-01 DIAGNOSIS — C3402 Malignant neoplasm of left main bronchus: Principal | ICD-10-CM

## 2017-09-01 DIAGNOSIS — C50812 Malignant neoplasm of overlapping sites of left female breast: Secondary | ICD-10-CM

## 2017-09-01 DIAGNOSIS — Z170 Estrogen receptor positive status [ER+]: Secondary | ICD-10-CM

## 2017-09-22 ENCOUNTER — Ambulatory Visit: Admit: 2017-09-22 | Discharge: 2017-09-22 | Payer: MEDICARE

## 2017-09-22 ENCOUNTER — Ambulatory Visit: Admit: 2017-09-22 | Discharge: 2017-09-22 | Payer: MEDICARE | Attending: Adult Health | Primary: Adult Health

## 2017-09-22 ENCOUNTER — Other Ambulatory Visit: Admit: 2017-09-22 | Discharge: 2017-09-22 | Payer: MEDICARE

## 2017-09-22 DIAGNOSIS — R05 Cough: Principal | ICD-10-CM

## 2017-09-22 DIAGNOSIS — C50812 Malignant neoplasm of overlapping sites of left female breast: Secondary | ICD-10-CM

## 2017-09-22 DIAGNOSIS — Z170 Estrogen receptor positive status [ER+]: Secondary | ICD-10-CM

## 2017-09-22 DIAGNOSIS — C3402 Malignant neoplasm of left main bronchus: Principal | ICD-10-CM

## 2017-09-22 MED ORDER — BENZONATATE 100 MG CAPSULE
ORAL_CAPSULE | Freq: Three times a day (TID) | ORAL | 1 refills | 0 days | Status: CP | PRN
Start: 2017-09-22 — End: 2017-12-05

## 2017-09-24 ENCOUNTER — Other Ambulatory Visit: Payer: Self-pay | Admitting: Internal Medicine

## 2017-09-28 ENCOUNTER — Ambulatory Visit: Admit: 2017-09-28 | Discharge: 2017-09-29 | Payer: MEDICARE

## 2017-09-28 ENCOUNTER — Other Ambulatory Visit: Admit: 2017-09-28 | Discharge: 2017-09-29 | Payer: MEDICARE

## 2017-09-28 DIAGNOSIS — Z170 Estrogen receptor positive status [ER+]: Secondary | ICD-10-CM

## 2017-09-28 DIAGNOSIS — C50812 Malignant neoplasm of overlapping sites of left female breast: Principal | ICD-10-CM

## 2017-09-28 DIAGNOSIS — R530 Neoplastic (malignant) related fatigue: Secondary | ICD-10-CM

## 2017-09-28 DIAGNOSIS — C3402 Malignant neoplasm of left main bronchus: Secondary | ICD-10-CM

## 2017-09-28 DIAGNOSIS — E079 Disorder of thyroid, unspecified: Secondary | ICD-10-CM

## 2017-10-21 ENCOUNTER — Other Ambulatory Visit: Admit: 2017-10-21 | Discharge: 2017-10-22 | Payer: MEDICARE

## 2017-10-21 ENCOUNTER — Ambulatory Visit: Admit: 2017-10-21 | Discharge: 2017-10-22 | Payer: MEDICARE | Attending: Adult Health | Primary: Adult Health

## 2017-10-21 ENCOUNTER — Ambulatory Visit: Admit: 2017-10-21 | Discharge: 2017-10-22 | Payer: MEDICARE

## 2017-10-21 DIAGNOSIS — R530 Neoplastic (malignant) related fatigue: Principal | ICD-10-CM

## 2017-10-21 DIAGNOSIS — C3402 Malignant neoplasm of left main bronchus: Secondary | ICD-10-CM

## 2017-10-21 DIAGNOSIS — E079 Disorder of thyroid, unspecified: Secondary | ICD-10-CM

## 2017-10-21 DIAGNOSIS — F0390 Unspecified dementia without behavioral disturbance: Secondary | ICD-10-CM

## 2017-10-21 DIAGNOSIS — Z170 Estrogen receptor positive status [ER+]: Secondary | ICD-10-CM

## 2017-10-21 DIAGNOSIS — C50812 Malignant neoplasm of overlapping sites of left female breast: Principal | ICD-10-CM

## 2017-11-01 ENCOUNTER — Ambulatory Visit: Payer: Medicare Other | Admitting: Internal Medicine

## 2017-11-01 DIAGNOSIS — Z0289 Encounter for other administrative examinations: Secondary | ICD-10-CM

## 2017-11-14 ENCOUNTER — Ambulatory Visit: Admit: 2017-11-14 | Discharge: 2017-11-14 | Payer: MEDICARE

## 2017-11-14 ENCOUNTER — Ambulatory Visit: Admit: 2017-11-14 | Discharge: 2017-11-14 | Payer: MEDICARE | Attending: Adult Health | Primary: Adult Health

## 2017-11-14 ENCOUNTER — Other Ambulatory Visit: Admit: 2017-11-14 | Discharge: 2017-11-14 | Payer: MEDICARE

## 2017-11-14 DIAGNOSIS — C3480 Malignant neoplasm of overlapping sites of unspecified bronchus and lung: Principal | ICD-10-CM

## 2017-11-14 DIAGNOSIS — C3402 Malignant neoplasm of left main bronchus: Secondary | ICD-10-CM

## 2017-11-14 DIAGNOSIS — Z170 Estrogen receptor positive status [ER+]: Secondary | ICD-10-CM

## 2017-11-14 DIAGNOSIS — R530 Neoplastic (malignant) related fatigue: Principal | ICD-10-CM

## 2017-11-14 DIAGNOSIS — C50812 Malignant neoplasm of overlapping sites of left female breast: Secondary | ICD-10-CM

## 2017-11-14 DIAGNOSIS — E079 Disorder of thyroid, unspecified: Secondary | ICD-10-CM

## 2017-12-05 ENCOUNTER — Ambulatory Visit
Admit: 2017-12-05 | Discharge: 2017-12-05 | Payer: MEDICARE | Attending: Geriatric Medicine | Primary: Geriatric Medicine

## 2017-12-05 ENCOUNTER — Ambulatory Visit: Admit: 2017-12-05 | Discharge: 2017-12-05 | Payer: MEDICARE

## 2017-12-05 ENCOUNTER — Ambulatory Visit: Admit: 2017-12-05 | Discharge: 2017-12-05 | Payer: MEDICARE | Attending: Registered" | Primary: Registered"

## 2017-12-05 ENCOUNTER — Other Ambulatory Visit: Admit: 2017-12-05 | Discharge: 2017-12-05 | Payer: MEDICARE

## 2017-12-05 DIAGNOSIS — C3402 Malignant neoplasm of left main bronchus: Secondary | ICD-10-CM

## 2017-12-05 DIAGNOSIS — G4700 Insomnia, unspecified: Secondary | ICD-10-CM

## 2017-12-05 DIAGNOSIS — R530 Neoplastic (malignant) related fatigue: Principal | ICD-10-CM

## 2017-12-05 DIAGNOSIS — L299 Pruritus, unspecified: Secondary | ICD-10-CM

## 2017-12-05 DIAGNOSIS — Z853 Personal history of malignant neoplasm of breast: Secondary | ICD-10-CM

## 2017-12-05 DIAGNOSIS — Z170 Estrogen receptor positive status [ER+]: Secondary | ICD-10-CM

## 2017-12-05 DIAGNOSIS — C50812 Malignant neoplasm of overlapping sites of left female breast: Principal | ICD-10-CM

## 2017-12-05 DIAGNOSIS — E079 Disorder of thyroid, unspecified: Secondary | ICD-10-CM

## 2017-12-05 DIAGNOSIS — Z713 Dietary counseling and surveillance: Principal | ICD-10-CM

## 2017-12-05 DIAGNOSIS — Z08 Encounter for follow-up examination after completed treatment for malignant neoplasm: Secondary | ICD-10-CM

## 2017-12-05 DIAGNOSIS — R05 Cough: Secondary | ICD-10-CM

## 2017-12-05 DIAGNOSIS — Z5111 Encounter for antineoplastic chemotherapy: Secondary | ICD-10-CM

## 2017-12-05 MED ORDER — BENZONATATE 100 MG CAPSULE
ORAL_CAPSULE | Freq: Three times a day (TID) | ORAL | 1 refills | 0 days | Status: CP | PRN
Start: 2017-12-05 — End: 2018-12-05

## 2017-12-26 ENCOUNTER — Ambulatory Visit: Admit: 2017-12-26 | Discharge: 2017-12-26 | Payer: MEDICARE

## 2017-12-26 ENCOUNTER — Other Ambulatory Visit: Admit: 2017-12-26 | Discharge: 2017-12-26 | Payer: MEDICARE

## 2017-12-26 ENCOUNTER — Ambulatory Visit: Admit: 2017-12-26 | Discharge: 2017-12-26 | Payer: MEDICARE | Attending: Registered" | Primary: Registered"

## 2017-12-26 ENCOUNTER — Ambulatory Visit: Admit: 2017-12-26 | Discharge: 2017-12-26 | Payer: MEDICARE | Attending: Adult Health | Primary: Adult Health

## 2017-12-26 DIAGNOSIS — C50812 Malignant neoplasm of overlapping sites of left female breast: Principal | ICD-10-CM

## 2017-12-26 DIAGNOSIS — E079 Disorder of thyroid, unspecified: Secondary | ICD-10-CM

## 2017-12-26 DIAGNOSIS — Z713 Dietary counseling and surveillance: Principal | ICD-10-CM

## 2017-12-26 DIAGNOSIS — C3402 Malignant neoplasm of left main bronchus: Secondary | ICD-10-CM

## 2017-12-26 DIAGNOSIS — Z170 Estrogen receptor positive status [ER+]: Secondary | ICD-10-CM

## 2017-12-26 DIAGNOSIS — R530 Neoplastic (malignant) related fatigue: Principal | ICD-10-CM

## 2017-12-29 ENCOUNTER — Ambulatory Visit: Admit: 2017-12-29 | Discharge: 2017-12-30 | Payer: MEDICARE

## 2017-12-29 DIAGNOSIS — F0390 Unspecified dementia without behavioral disturbance: Principal | ICD-10-CM

## 2017-12-29 DIAGNOSIS — R29898 Other symptoms and signs involving the musculoskeletal system: Secondary | ICD-10-CM

## 2018-01-16 ENCOUNTER — Other Ambulatory Visit: Admit: 2018-01-16 | Discharge: 2018-01-16 | Payer: MEDICARE

## 2018-01-16 ENCOUNTER — Ambulatory Visit: Admit: 2018-01-16 | Discharge: 2018-01-16 | Payer: MEDICARE

## 2018-01-16 ENCOUNTER — Ambulatory Visit
Admit: 2018-01-16 | Discharge: 2018-01-16 | Payer: MEDICARE | Attending: Geriatric Medicine | Primary: Geriatric Medicine

## 2018-01-16 DIAGNOSIS — C3402 Malignant neoplasm of left main bronchus: Secondary | ICD-10-CM

## 2018-01-16 DIAGNOSIS — Z5111 Encounter for antineoplastic chemotherapy: Secondary | ICD-10-CM

## 2018-01-16 DIAGNOSIS — J302 Other seasonal allergic rhinitis: Secondary | ICD-10-CM

## 2018-01-16 DIAGNOSIS — Z170 Estrogen receptor positive status [ER+]: Secondary | ICD-10-CM

## 2018-01-16 DIAGNOSIS — R05 Cough: Secondary | ICD-10-CM

## 2018-01-16 DIAGNOSIS — C50812 Malignant neoplasm of overlapping sites of left female breast: Principal | ICD-10-CM

## 2018-01-16 DIAGNOSIS — R530 Neoplastic (malignant) related fatigue: Principal | ICD-10-CM

## 2018-01-16 DIAGNOSIS — E079 Disorder of thyroid, unspecified: Secondary | ICD-10-CM

## 2018-01-16 DIAGNOSIS — R5383 Other fatigue: Secondary | ICD-10-CM

## 2018-01-16 MED ORDER — EXEMESTANE 25 MG TABLET
ORAL_TABLET | Freq: Every day | ORAL | 11 refills | 0.00000 days | Status: CP
Start: 2018-01-16 — End: 2018-04-10

## 2018-01-16 MED ORDER — FLUTICASONE PROPIONATE 50 MCG/ACTUATION NASAL SPRAY,SUSPENSION
Freq: Every day | NASAL | 0 refills | 0.00000 days | Status: CP
Start: 2018-01-16 — End: 2019-01-16

## 2018-01-24 ENCOUNTER — Other Ambulatory Visit: Payer: Self-pay | Admitting: Internal Medicine

## 2018-03-17 ENCOUNTER — Ambulatory Visit: Admit: 2018-03-17 | Discharge: 2018-03-17 | Payer: MEDICARE

## 2018-03-17 DIAGNOSIS — Z170 Estrogen receptor positive status [ER+]: Secondary | ICD-10-CM

## 2018-03-17 DIAGNOSIS — C50812 Malignant neoplasm of overlapping sites of left female breast: Principal | ICD-10-CM

## 2018-03-17 DIAGNOSIS — C3402 Malignant neoplasm of left main bronchus: Secondary | ICD-10-CM

## 2018-03-18 ENCOUNTER — Ambulatory Visit
Admit: 2018-03-18 | Discharge: 2018-03-28 | Payer: MEDICARE | Attending: Radiation Oncology | Primary: Radiation Oncology

## 2018-03-20 ENCOUNTER — Other Ambulatory Visit: Payer: Self-pay | Admitting: Internal Medicine

## 2018-03-28 ENCOUNTER — Ambulatory Visit
Admit: 2018-03-28 | Discharge: 2018-03-29 | Payer: MEDICARE | Attending: Critical Care Medicine | Primary: Critical Care Medicine

## 2018-03-28 DIAGNOSIS — C50812 Malignant neoplasm of overlapping sites of left female breast: Principal | ICD-10-CM

## 2018-03-28 DIAGNOSIS — Z170 Estrogen receptor positive status [ER+]: Secondary | ICD-10-CM

## 2018-03-28 DIAGNOSIS — C3402 Malignant neoplasm of left main bronchus: Secondary | ICD-10-CM

## 2018-03-30 DIAGNOSIS — J9809 Other diseases of bronchus, not elsewhere classified: Principal | ICD-10-CM

## 2018-03-31 ENCOUNTER — Encounter: Admit: 2018-03-31 | Discharge: 2018-03-31 | Payer: MEDICARE

## 2018-03-31 ENCOUNTER — Ambulatory Visit: Admit: 2018-03-31 | Discharge: 2018-03-31 | Payer: MEDICARE

## 2018-03-31 DIAGNOSIS — J9809 Other diseases of bronchus, not elsewhere classified: Principal | ICD-10-CM

## 2018-04-10 ENCOUNTER — Ambulatory Visit: Admit: 2018-04-10 | Discharge: 2018-04-11 | Payer: MEDICARE | Attending: Adult Health | Primary: Adult Health

## 2018-04-10 DIAGNOSIS — Z170 Estrogen receptor positive status [ER+]: Secondary | ICD-10-CM

## 2018-04-10 DIAGNOSIS — C50812 Malignant neoplasm of overlapping sites of left female breast: Principal | ICD-10-CM

## 2018-04-10 LAB — COMPREHENSIVE METABOLIC PANEL
ALBUMIN: 3.9 g/dL (ref 3.5–5.0)
ALKALINE PHOSPHATASE: 84 U/L (ref 38–126)
ALT (SGPT): 11 U/L (ref <35)
ANION GAP: 10 mmol/L (ref 7–15)
AST (SGOT): 20 U/L (ref 14–38)
BILIRUBIN TOTAL: 0.3 mg/dL (ref 0.0–1.2)
BLOOD UREA NITROGEN: 23 mg/dL — ABNORMAL HIGH (ref 7–21)
BUN / CREAT RATIO: 30
CALCIUM: 9.5 mg/dL (ref 8.5–10.2)
CHLORIDE: 100 mmol/L (ref 98–107)
CREATININE: 0.76 mg/dL (ref 0.60–1.00)
EGFR CKD-EPI AA FEMALE: 88 mL/min/{1.73_m2} (ref >=60)
EGFR CKD-EPI NON-AA FEMALE: 76 mL/min/{1.73_m2} (ref >=60)
GLUCOSE RANDOM: 104 mg/dL (ref 70–179)
POTASSIUM: 3.9 mmol/L (ref 3.5–5.0)
PROTEIN TOTAL: 7.6 g/dL (ref 6.5–8.3)
SODIUM: 137 mmol/L (ref 135–145)

## 2018-04-10 LAB — CBC W/ AUTO DIFF
BASOPHILS ABSOLUTE COUNT: 0.1 10*9/L (ref 0.0–0.1)
EOSINOPHILS ABSOLUTE COUNT: 0.2 10*9/L (ref 0.0–0.4)
EOSINOPHILS RELATIVE PERCENT: 4 %
HEMATOCRIT: 32.7 % — ABNORMAL LOW (ref 36.0–46.0)
LARGE UNSTAINED CELLS: 2 % (ref 0–4)
LYMPHOCYTES ABSOLUTE COUNT: 1.1 10*9/L — ABNORMAL LOW (ref 1.5–5.0)
LYMPHOCYTES RELATIVE PERCENT: 17.3 %
MEAN CORPUSCULAR HEMOGLOBIN CONC: 32.4 g/dL (ref 31.0–37.0)
MEAN CORPUSCULAR HEMOGLOBIN: 29.1 pg (ref 26.0–34.0)
MEAN CORPUSCULAR VOLUME: 89.8 fL (ref 80.0–100.0)
MEAN PLATELET VOLUME: 8 fL (ref 7.0–10.0)
MONOCYTES ABSOLUTE COUNT: 0.5 10*9/L (ref 0.2–0.8)
MONOCYTES RELATIVE PERCENT: 8.6 %
NEUTROPHILS ABSOLUTE COUNT: 4.1 10*9/L (ref 2.0–7.5)
NEUTROPHILS RELATIVE PERCENT: 67.1 %
PLATELET COUNT: 323 10*9/L (ref 150–440)
RED BLOOD CELL COUNT: 3.65 10*12/L — ABNORMAL LOW (ref 4.00–5.20)
RED CELL DISTRIBUTION WIDTH: 15.2 % — ABNORMAL HIGH (ref 12.0–15.0)
WBC ADJUSTED: 6.1 10*9/L (ref 4.5–11.0)

## 2018-04-10 LAB — MEAN CORPUSCULAR VOLUME: Lab: 89.8

## 2018-04-10 LAB — EGFR CKD-EPI NON-AA FEMALE: Lab: 76

## 2018-04-10 MED ORDER — LETROZOLE 2.5 MG TABLET
ORAL_TABLET | Freq: Every day | ORAL | 3 refills | 0.00000 days | Status: CP
Start: 2018-04-10 — End: 2019-04-10

## 2018-04-10 MED ORDER — PALBOCICLIB 100 MG CAPSULE
ORAL_CAPSULE | Freq: Every day | ORAL | 5 refills | 0.00000 days | Status: CP
Start: 2018-04-10 — End: 2018-05-01

## 2018-04-10 MED ORDER — PALBOCICLIB 100 MG CAPSULE: 100 mg | capsule | Freq: Every day | 5 refills | 0 days | Status: AC

## 2018-04-10 NOTE — Unmapped (Signed)
It was a pleasure to see you today in the Medical Oncology Clinic.  Please call our nurse navigator, Modena Jansky, if you have any interval questions or concerns:     For health related questions call:   Please call NURSE TRIAGE LINE at [phone]    For appointments & questions Monday through Friday 8 AM???5 PM     Please call [phone] or Toll free [phone]    On Lovenia Kim and Holidays  Call [phone] and ask for the oncologist on call    Nurse Navigator:   Modena Jansky, RN: phone: [phone]     Prescription refills: [phone]     FAX: [phone]     For emergencies, evenings or weekends, please call [phone] and ask for the oncology fellow on call.     Reasons to call emergency line may include:   Fever of 100.5 or greater   Nausea and/or vomiting not relieved with nausea medicine   Diarrhea or constipation not relieved with bowel regimen   Severe pain not relieved with usual pain regimen     Essa Malachi Gaynell Face, DNP, NP-C  Breast Medical Oncology  [facility] Hematology/Oncology  [address]  [place], [place] [address]

## 2018-04-10 NOTE — Unmapped (Signed)
Informed Consent Documentation  Study: STRATA     I met with patient Katherine Sherman to discuss consent for the STRATA trial.  The protocol was reviewed including discussion of risks & benefits, confidentiality, time commitments involved, study contact list, and the option to withdraw at any time. Alternatives to study participation were discussed.  The patient was given reasonable time to consider participation in the study, in the absence of coercion or undue influence. Patient was offered an opportunity for questions and these questions were answered.  Patient verbalized understanding of information presented.      The patient has signed the study informed consent and HIPAA Authorization Form as follows:   X     In my presence  A copy of the informed consent and HIPAA Authorization Form was given to the patient.  The informed consent and HIPAA Authorization Form will be placed in the regulatory files in the Office of Clinical & Translational Research. Every effort to maintain confidentiality will be employed.     Date: 04/10/2018  Time: 11:29 AM  X     HIPAA Form Signature:  Same as above        Plan: Histology will cut slides to be sent to [facility] for Next-Generation Sequencing. Once report is generated it will be put in pts Labs tab in their EMR.

## 2018-04-10 NOTE — Unmapped (Addendum)
Follow-up outpatient evaluation    Referring Physician: Murrell Redden, MD    PCP: Corwin Levins, MD    Consulting Physicians: Surgical oncology: Lucretia Roers M.D.    First line: Ibrance + letrozole  Second line: consider PACE study (with Faslodex)   -----------------------------------------------------------------------------------------------------  Assessment/Plan:  I spent at least 40  minutes with this patient more than 50% in counseling. The following issues were discussed: Katherine Sherman is a 78 y.o. 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 NSCLC confirmed on bx of left hilar LN (11L).  She was on primary endocrine therapy (letrozole and tamoxifen) from 03/2016 until 03/2017, underwent left mastectomy on 04/26/2017 followed by postmastectomy radiation completed 07/2017 , and IV CMF x 8 cycles completed 01/16/2018.  She was then started on exemestane.    She underwent her annual CT chest for lung cancer surveillance and unfortunately was found to have several new lung masses in both lobes.  Largest was left hilar paramediastinal mass measuring 5 x 3 cm.  She underwent bronchoscopy for biopsy of the left hilar mass on 03/31/2018, biopsy was positive for metastatic adenocarcinoma morphologically similar to breast cancer with ER 60% +, PR 30% + and HER-2 negative.  She presents today to review imaging and biopsy results, her daughter (Amy) was notified by Dr. Curt Bears of the biopsy results on 04/06/2018.    We discussed her diagnosis, stage, pathology, and overall prognosis. We discussed the natural history of stage IV breast cancer and the standard approach to treating stage IV HR+ breast cancer. She was advised that while her disease is incurable, it is very treatable and many women live years on treatment. Our goals of therapy are to control your disease for as long as possible while minimizing side effects.  We discussed the plan for treatment with Ibrance + letrozole, her recurrence occurred on exemestane and will plan to hold Faslodex for second line treatment (consider the PACE clinical trial).  We discussed today the [id number] clinical trial for 70+ adults which would include the standard of care letrozole + Ibrance. Her daughter has concern over the cost of Ilda Foil, met with Aimee [place] today for teaching and discussion of grant/financial support for Ibrance. They would like to know insurance cost at which point Aimee will contact them to discuss grant and financial support options.    She needs to complete stating with CT a/p + Bone scan which we will get ASAP. She will stop the exemestane and start letrozole, will plan to start Ibrance once we get it approved. Will start Ibrance at 100 mg given her age and concern for tolerance. Will plan to have her get labs 2 weeks after Ibrance, and return in 4 weeks prior to next cycle of Ibrance.     PLAN:    Metastatic Breast Cancer to lung/hilar nodes (ER 60%, PR 30%, and HER2 -)  -- Treatment: first line letrozole + palbociclib 100 mg (d/t age), met with coordinator for the [id number] (70+) trial, hesitant about Ilda Foil given potential cost. 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 Started on Tamoxifen 03/04/17 with progression in 1 month.   -- Surgery: 04/26/17 Mastectomy and SLNE: 7.9 cm invasive ductal carcinoma with 2+ lymph nodes the largest deposit measuring 1.5 cm with extracapsular extension.   -- Chemotherapy: first generation CMF q21d x 8 cycles completed 01/12/18.   -- Radiation: Post-mastectomy radiation completed on 07/28/17.    Stage IIB [cT1cN1] - mucoepidermoid NSCLC 2.6 cm left hilar mass, with + left hilar Ln (N1) and negative N2, N3 LNs.   -- Completed tentative radiation to the left hilar mass between 08/10/16 and 08/24/16   -- Repeat systemic stating 05/30/17 with no concern for recurrence of distant metastasis.   -- scheduled for repeat CT chest 02/28/18 with Dr. Dawna Part f/u    Supportive care  --Cough: persistent for > 6 months. Per family feel that cough has gotten worse in last month. Dry, sometimes with scant sputum production. CTM  -- loss of appetite: weight stable at ~ 122 lbs. Eating more carbs/sugars, snacking throughout the day. CTM her weight closely  -- Pain: denies any pain today  -- Chest pain: on the left chest, just above her mastectomy incision. Mild tenderness to palpation without masses or skin changes. No bone mets on CT scan, not requiring medications.     Follow-up  -- Letrozole + palbociclib called in the pharmacy.  -- stop exemstane and start letrozole. Aimee with f/u with family re: palbo cost  -- CT a/p + Bone scan ASAP  -- RTC in 4 weeks after starting Ibrance.     Patient seen today with Dr. Archie Balboa, please see his attestation for additional visit details.     ADDENDUM 04/27/18: Bone scan showed new bone lesions. Right femur, left 7th, and L3 lesions. Given this, we will plan to initiate Xgeva at her next visit, orders placed today. CT a/p with increased nodularity of the right adrenal gland.- Carlos American, NP  ------------------------------------------------------------------------------------------------------------------------------------  Chief Complaint   Patient presents with   ??? Routine Follow-up       History of the Present Illness: Katherine Sherman is a pleasant 78 y.o. female with history of HTN, HDL and at least mild cognitive impairment who was recently diagnosed with at least Stage III breast cancer and started on neoadjuvant endocrine therapy with letrozole. She was started on letrozole by her local oncologist since  04/27/2016. She was not a candidate for ALTERNATE trial since she had already been started on endocrine therapy. We performed staging bone and CT scans which showed a 2.6 cm left hilar mass, multiple small left lower lobe lung nodules up to 1 cm, and multiple hypodensities in the liver that are indeterminate up to 1.cm. Bone scan was negative. MRI brain ruled out brain mets. Imaging discussed at [facility] and IP performed EBUS with mediastinal/ hilar LN biopsy and an 11L (left hilar) LN was  positive for NSCLC - mucoepidermoid carcinoma. At least Stage IIB [cT1c,N1] NSCLC. Given the cognitive impairment and mildly limited functional status she was felt to not be a got surgical candidate and therefore definitive radiation therapy was recommended. It remains unclear what the other LLL pulmonary nodules and the indeterminate hepatic hypodensities represent (lung cancer, breast  cancer or benign).    Initially she was started on primary endocrine therapy with letrozole.  Consideration for neoadjuvant TC however given low rates of PCR and cognitive impairment making it difficult to tolerate chemo elected to continue with primary endocrine therapy.  Initially had response, in 12/2016 developed a new left axillary lymph node, on exam in December 2018 found to have a 1 cm of erosion through the skin. She underwent left mastectomy and sentinel lymph node evaluation on 04/26/17. Final pathology showed a 7.9 cm invasive ductal carcinoma with 2/10 + lymph nodes the largest deposit measuring 1.5 cm with extracapsular extension. Restaging CT Chest/Abd 06/09/17 generally stable compared to prior with no definitive evidence of metastatic disease. Given the concern for local control with rapid progression on primary endocrine therapy, elected to undergo radiation to the chest wall and nodal regions initially in the adjuvant setting.  She completed radiation on 07/29/2017. Started on CMF completed 12/2017. On 03/10/18 CT chest with new lung massess and hilar mass (5 x 3 cm), bx confirmed metastatic breast cancer ER 60%, PR 30% and HER2-.     Interval history:   Accompanied today by her daughter (Amy) and son-in-law. Sometimes here with her son Kathlene November). Here today to review scans and biopsy results  -- she has had a cough > 6 months, felt to be post-nasal drip. Her daughter thinks the dry hacking has been worse in the last month. She sometimes has scants amount of sputum when she coughs. Denies any SOB or DOE.  -- fatigue is mild, she is not very active during the day despite encouragement from her daughter (whom she lives with)  -- left chest pain, just above the mastectomy scar. First noted a few weeks ago. Mild tenderness on exam. She has no masses or skin changes overlying this area. Has not been taking tylenol or ibuprofen for this as it is only bothersome intermittently.  -- loss of appetite, her daughter reports she has been eating unhealthy only wanting a bund of carbs and sugar. Her weight has been stable in the last 3 months (~ 122 lbs)  -- she otherwise denies any new pains. No nausea, vomiting, early satiety. No abdominal pain.     Review of Systems: A complete twelve systems review was obtained and is positive per the HPI but otherwise negative in detail. See MIMS #1170 where available.    Functional Status:  ECOG PS = 1 independent ADLs.  Requires assistance with IADLs.       Malignant neoplasm of left breast in female, estrogen receptor positive (CMS-HCC)    03/2016 -  Presenting Symptoms     6 months of noticing left breast mass. Eventually got MMG which showed a 6 cm Left beast mass.       04/09/2016 Biopsy     IDC with DCIS, ER +, PR+., HER2 neg, Ki67 12%.       04/09/2016 Initial Diagnosis     Malignant neoplasm of left breast in female, estrogen receptor positive (RAF-HCC).       04/22/2016 -  Cancer Staged     MRI breast b/l:  inner lower quadrant left breast  measuring 4.8 x 4.6 x 6.5 cm with evidence of pectoralis muscle invasion, but no involvement of underlying chest wall. Abnormal left internal mammary lymph node measuring 9 x 16 mm. (cN2b ipsilat int mammary without axillary LN). No suspicious axillary adenopathy.    [place] read of this MRI: 2 left internal mammary nodes 1.6 and  0.6 cm, Left level 1 axillary node 1.3 cm     This makes it cN3b (ispilateral internal mammary and axillary LN)    However [place] read of targeted left axillary US showed no abnormal LN.     Stage IIIA vs IIIC [T3N2b vs N3c]          04/27/2016 -  Other     Oncotype DX testing sent by primary oncologist, Dr Pamelia Hoit at [facility]  to guide decision for neoadjuvant chemotherapy vs endocrine therapy. RS = 19.       04/27/2016 Endocrine/Hormone Therapy     Started on Neoadjuvant Endocrine therapy with Letrazole       06/17/2016 -  Cancer Staged     CT C/A/P:   2.6 cm left hilar mass, several LLL nodules up to 1.0 cm and a 2.5 cm LLL nodule. Several liver hypodensities up to 1.0 cm - indeterminate.  Known left breast mass 5.5 cm involving the pectoralis muscle.     Bone scan negative. Brain MRI negative.         Malignant neoplasm of overlapping sites of lung (CMS-HCC)    06/17/2016 -  Cancer Staged     CT C/A/P: 2.6 cm left hilar mass, several LLL nodules up to 1.0 cm and a 2.5 cm LLL nodule. Several liver hypodensities up to 1.0 cm - indeterminate.  Known left breast mass.     MRI brain - negative        06/30/2016 Biopsy     EBUS: N2, N3 Ln negative . 11L LN + for mucoepidermoid NSCLC.       07/06/2016 Initial Diagnosis     Malignant neoplasm of overlapping sites of lung (CMS-HCC). 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 Metastatic adenocarcinoma    The previous lung carcinoma ([id number]) demonstrating a mucoepidermoid of the lung is reviewed as well as the previous ductal carcinoma of the breast ([id number]???[id number]) and the current malignancy is morphologically similar to the breast carcinoma. ??Panel of immunohistochemical stains also supports that the metastatic adenocarcinoma is of breast origin based on positive staining of the malignant cells in the cell block for Gata-3, estrogen receptor protein (moderate to strong staining, 60%), and progesterone receptor protein (mild to moderate staining, 30%) with 0 negative staining for HER-2/neu. ??Immunohistochemical stain for P 40 as would be expected in an muco-epidermoid carcinoma is negative.         Patient Active Problem List   Diagnosis   ??? Malignant neoplasm of left breast in female, estrogen receptor positive (CMS-HCC)   ??? Essential hypertension   ??? Borderline diabetes   ??? Osteopenia of multiple sites   ??? Malignant neoplasm of overlapping sites of lung (CMS-HCC)   ??? Dementia without behavioral disturbance (CMS-HCC)   ??? 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 Neoplastic malignant related fatigue   ??? Thyroid dysfunction   ??? Allergic rhinitis       Past Medical History:   Diagnosis Date   ??? Borderline diabetes    ??? Breast cancer (CMS-HCC) 03/2016    left   ??? Forgetfulness    ??? HTN (hypertension)    ??? Hyperlipemia    ??? Lung cancer (CMS-HCC)          Past Surgical  History:   Procedure Laterality Date   ??? BREAST BIOPSY Left 03/2016    br ca   ??? HYSTERECTOMY  1979   ??? PR BRNCHSC EBUS GUIDED SAMPL 1/2 NODE STATION/STRUX N/A 03/31/2018    Procedure: Bronch, Rigid Or Flexible, Inc Fluoro Guidance, When Performed; With Ebus Guided Transtracheal And/Or Transbronchial Sampling, One Or Two Mediastinal And/Or Hilar Lymph Node Stations Or Structures;  Surgeon: Sherwood Gambler, MD;  Location: MAIN OR [facility];  Service: Pulmonary   ??? PR BRNCHSC EBUS GUIDED SAMPL 3/> NODE STATION/STRUX N/A 06/30/2016    Procedure: Bronch, Rigid Or Flexible, Including Fluoro Guidance, When Performed; W Ebus Guided Transtracheal And/Or Transbronchial Sampling, 3 Or More Mediastinal And/Or Hilar Lymph Node Stations Or Structures;  Surgeon: Sherwood Gambler, MD;  Location: MAIN OR [facility];  Service: Pulmonary   ??? PR BRONCHOSCOPY,BIOPSY N/A 03/31/2018    Procedure: Bronchoscopy, Rigid/Flexible, Include Fluoro Guide When Performed; W/Bronch/Endobronch Bx, Single/Mult Site;  Surgeon: Sherwood Gambler, MD;  Location: MAIN OR [facility];  Service: Pulmonary   ??? PR MASTECTOMY, RADICAL, URBAN TYPE Left 04/26/2017    Procedure: Mastectomy, Radical, Including Pectoral Muscles, Axillary And Internal Mammary Lymph Nodes (Urban Type Op);  Surgeon: Aris Everts, MD;  Location: MAIN OR [facility];  Service: Surgical Oncology Breast   ??? RADIATION Left     x 1 month       Gyn History:  G4P4. No h/o HRT. Had OCP for 30 years.  Attained menopause at 78 years old with partial hysterectomy 2/2 adheshions. Ovaries left in.     Medications:  Current Outpatient Medications   Medication Sig Dispense Refill   ??? amLODIPine (NORVASC) 5 MG tablet Take 5 mg by mouth daily.     ??? ezetimibe (ZETIA) 10 mg tablet Take 10 mg by mouth daily.     ??? benzonatate (TESSALON PERLES) 100 MG capsule Take 1 capsule (100 mg total) by mouth Three (3) times a day as needed for cough. (Patient not taking: Reported on 03/28/2018) 30 capsule 1   ??? fluticasone propionate (FLONASE) 50 mcg/actuation nasal spray 1 spray by Each Nare route daily. (Patient not taking: Reported on 03/28/2018) 16 g 0   ??? ibuprofen (ADVIL,MOTRIN) 200 MG tablet Take 200 mg by mouth daily as needed for pain.     ??? letrozole (FEMARA) 2.5 mg tablet Take 1 tablet (2.5 mg total) by mouth daily. 90 tablet 3   ??? palbociclib (IBRANCE) 100 mg capsule Take 1 capsule (100 mg total) by mouth daily. Take with food. 30 capsule 0     No current facility-administered medications for this visit.        Allergies:  No Known Allergies    Family History:  Cancer-related family history includes Cancer in her daughter. There is no history of Breast cancer or Ovarian cancer.  1 living brother. Twin sister died after birth.  Another brother died in 13 Of heart disease. No h/o breast cancer.  Daughter died at age 49 with GIST.     Social History:   Social History     Patient does not qualify to have social determinant information on file (likely too young).   Social History Narrative    She lives with her daughter Linton Rump, son-in-law Michele Mcalpine, and her grandson in [place], [place] (lived independently until Summer 2018). 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 She is divorced. She has four children - one deceased (metastatic GIST 07/05/15), three in [place] (  [place] in [place], and [place] in [place]). Denies smoking, EtOH. She requires assistance in all iADLs but is independent in ADLs.        Physical Examination:  Vital Signs:BP 128/68  - Pulse 77  - Temp 36.1 ??C (97 ??F) (Temporal)  - Resp 18  - Wt 55 kg (121 lb 3.2 oz)  - SpO2 98%  - BMI 22.16 kg/m??   General: Frail-appearing patient in no acute distress.  HEENT: PERRTLA, EOMI, OP clear  Cardiovascular: Normal S1 and S2. RRR. No audible murmurs.  Respiratory: Chest clear to percussion and auscultation.   Gastrointestinal: Abdomen soft without masses and tenderness, no hepatosplenomegaly or other masses.   Musculoskeletal: No bony pain or tenderness over spine or hips.  Skin: No rash, ecchymoses or purpuric lesions noted.  Breasts: Right: Normal. Left breast: Status post mastectomy w/ well-healed incision extending into left axilla, resolving hyperpigmentation 2/2 radiation. Mild tenderness on left chest wall just below clavicle, no palpable masses or skin changes.  Neurologic: Alert and oriented. Grossly non-focal Lymphatic: No cervical, axillary or supraclavicular adenopathy.   Extremity: No lower extremity edema or upper extremity lymphedema      DATA REVIEW:    Laboratory:     Lab Results   Component Value Date    WBC 4.9 01/16/2018    HGB 12.1 01/16/2018    HCT 37.6 01/16/2018    PLT 349 01/16/2018       Lab Results   Component Value Date    NA 139 08/04/2017    K 4.2 08/04/2017    CL 102 08/04/2017    CO2 26.0 08/04/2017    BUN 26 (H) 08/04/2017    CREATININE 0.78 08/04/2017    GLU 90 08/04/2017    CALCIUM 10.0 08/04/2017       Lab Results   Component Value Date    BILITOT 0.4 08/04/2017    PROT 7.4 08/04/2017    ALBUMIN 4.1 08/04/2017    ALT 18 08/04/2017    AST 22 08/04/2017    ALKPHOS 90 08/04/2017

## 2018-04-10 NOTE — Unmapped (Signed)
Per test claim for [facility] 100MG  CAPSULE at the [facility], patient needs Medication Assistance Program for Prior Authorization.

## 2018-04-11 NOTE — Unmapped (Signed)
Pharmacist Oral Chemotherapy Education    Medications: Palbociclib    Medication Education     Katherine Sherman is a 78 y.o. female with breast cancer who I am counseling today on palbociclib.    Palbociclib (Ibrance)  Oral chemotherapy regimen: Palbociclib 100 mg PO daily x 21 days of a 28 day cycle  Specialty Pharmacy: Pending benefits investigation  Start date: Pending    Side effects discussed included but were not limited to: myelosuppression, particularly neutropenia, nausea/vomiting, fatigue, diarrhea, mucositis, alopecia, and pulmonary toxicity.. Proper administration of the medication, regimen schedule, oral chemotherapy handling precautions, and potential drug interactions were explained. Also, reviewed that CBC/diff will be monitored q2weeks for the first two cycles.    Drug interactions: None identified    Handouts provided:  Patient drug information handouts from [facility]    Patient verbalized understanding of the above information as well as how to contact the Team with any questions/concerns.    Approximate time with patient: 20 minutes     Zenith Kercheval Oleh Genin PharmD, BCOP, CPP  Hematology/Oncology Pharmacist  P: [phone]

## 2018-04-11 NOTE — Unmapped (Signed)
Informed Consent Documentation  Study: [id number] HARMONY     I met with patient Katherine Sherman to discuss consent for the HARMONY trial.  The HARMONY trial is an interventional trial designed to determine the impact of molecular information on clinical decision-making in metastatic breast cancer.The protocol was reviewed including discussion of risks & benefits, confidentiality, time commitments involved, study contact list, and the option to withdraw at any time. Alternatives to study participation were discussed.  The patient was given reasonable time to consider participation in the study, in the absence of coercion or undue influence. Patient was offered an opportunity for questions and these questions were answered.  Patient verbalized understanding of information presented.      The patient has signed the study informed consent and HIPAA Authorization Form as follows:   X     In my presence  A copy of the informed consent and HIPAA Authorization Form was given to the patient.  The informed consent and HIPAA Authorization Form will be placed in the regulatory files in the Office of Clinical & Translational Research. Every effort to maintain confidentiality will be employed.     Date: 04/10/2018   X     HIPAA Form Signature:  Same as above        Plan: Primary and metastatic tissue will be sent for genetic testing.  PAM50 results from the primary site will be reported back to the patient's provider.

## 2018-04-11 NOTE — Unmapped (Signed)
Per test claim for Ibrance at the [facility], patient needs Medication Assistance Program for High Copay.

## 2018-04-14 NOTE — Unmapped (Signed)
Informed Consent Documentation     Study: [id number]: A Phase II Trial Assessing the Tolerability of Palbociclib in Combination with Letrozole or Fulvestrant in Patient Aged 78 and Older with Estrogen Receptor-Positive, HER2-Negative Metastatic Breast Cancer     Dr. Archie Balboa, Lynnell Chad NP, and I met with patient Katherine Sherman. [place] and her daughter Linton Rump and son-in-law to discuss consent for the [phone] trial.  The protocol was reviewed including discussion of risks & benefits, that the treatment involves research, medications/treatments used, procedures, confidentiality, time commitments involved, study contact list, the option to withdraw at anytime, required use of birth control & cost (if applicable). Alternatives to study participation were discussed. The patient was given reasonable time to consider participation in the study, in the absence of coercion or undue influence. Patient was offered an opportunity for questions and these questions were answered.  Patient verbalized understanding of information presented.      The patient has signed the study informed consent and HIPAA Authorization Form as follows:   X    In my presence    A copy of the informed consent and HIPAA Authorization Form was given to the patient.     The informed consent and HIPAA Authorization Form was placed in the patient???s medical record and in the patient's Clinical Trial Office shadow chart. Every effort to maintain confidentiality will be employed.     Date: 04/10/18 Time:1025     X    HIPAA Form Signature:  Same as above        ECOG:1    Menopausal status: post menopausal     Life expectancy: <     Plan: RTC to obtain baseline labs, and bone CT scans before Day 1 study activities commence. Patient understands that lab values must meet eligibility requirements.     Patient is able to read and comprehend English  Patient is over age 54. (78yrs old)  Patient is able to swallow pills  Patient is not currently taking any antibiotics or have any current, active infections.   Patient has agreed to abstain from any grapefruit or grapefruit juice during study.   Patient has declined the sub-studies offered for specimen banking.    Marguerita Merles  Study Coordinator  [phone]

## 2018-04-26 ENCOUNTER — Ambulatory Visit: Admit: 2018-04-26 | Discharge: 2018-05-09 | Payer: MEDICARE

## 2018-04-26 ENCOUNTER — Ambulatory Visit: Admit: 2018-04-26 | Discharge: 2018-05-25 | Payer: MEDICARE

## 2018-04-26 DIAGNOSIS — C50919 Malignant neoplasm of unspecified site of unspecified female breast: Principal | ICD-10-CM

## 2018-04-26 DIAGNOSIS — Z170 Estrogen receptor positive status [ER+]: Secondary | ICD-10-CM

## 2018-04-26 DIAGNOSIS — C50812 Malignant neoplasm of overlapping sites of left female breast: Secondary | ICD-10-CM
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
## 2018-05-19 NOTE — Unmapped (Signed)
Lab orders faxed to [facility] in [place] (Fax [phone]; T [phone]).  Patient to get labs on 3/2.

## 2018-05-28 ENCOUNTER — Other Ambulatory Visit: Payer: Self-pay | Admitting: Internal Medicine

## 2018-05-30 ENCOUNTER — Other Ambulatory Visit: Payer: Self-pay | Admitting: Internal Medicine

## 2018-05-30 NOTE — Unmapped (Signed)
Pharmacist Oral Chemotherapy Telephone Follow-Up    Assessment and recommendations:  Katherine Sherman has tolerated palbociclib well for the first two weeks of the medication.  Her ANC is adequate for her to continue this current cycle.    Follow-up:  3/16 for provider visit and C2D1 palbociclib labs  ___________________________________________________________________________  HPI: Katherine Sherman is a 78 y.o. female with metastatic breast cancer who I am following while on oral chemotherapy.      Oral Chemotherapy: Palbociclib 100 mg po daily x 21 days then 7 days off  Start date: 2//17/20  Pharmacy:  [facility]ist, molecular Assistance program    Interim History: Katherine Sherman had C1D15 follow-up labs for palbociclib on 3/2.  I spoke with her daughter, Amy, to assess how her mother has done with the first two weeks of therapy.  Amy reports that her mother has done well and has no side effects to report.  Labs from [facility] today have been uploaded to the media tab.  ANC is 1.2.    Adherence: No missed doses    Side effects:  Amy reports that her mother has no adverse effects    Drug Interactions: Amy is interested in having her mother drink [employer] super foods smoothie.  I told her that I would review the product to see if there any potential drug interactions with palbociclib.

## 2018-06-01 ENCOUNTER — Ambulatory Visit (INDEPENDENT_AMBULATORY_CARE_PROVIDER_SITE_OTHER): Payer: Medicare Other | Admitting: Internal Medicine

## 2018-06-01 ENCOUNTER — Encounter: Payer: Self-pay | Admitting: Internal Medicine

## 2018-06-01 ENCOUNTER — Other Ambulatory Visit: Payer: Self-pay | Admitting: Internal Medicine

## 2018-06-01 VITALS — BP 100/56 | HR 81 | Temp 97.6°F | Ht 63.0 in | Wt 116.0 lb

## 2018-06-01 DIAGNOSIS — E119 Type 2 diabetes mellitus without complications: Secondary | ICD-10-CM | POA: Diagnosis not present

## 2018-06-01 DIAGNOSIS — I10 Essential (primary) hypertension: Secondary | ICD-10-CM | POA: Diagnosis not present

## 2018-06-01 DIAGNOSIS — E785 Hyperlipidemia, unspecified: Secondary | ICD-10-CM | POA: Diagnosis not present

## 2018-06-01 NOTE — Assessment & Plan Note (Addendum)
Diet controlled, stable overall by history and exam, recent data reviewed with pt, and pt to continue medical treatment as before,  to f/u any worsening symptoms or concerns, for a1c with labs

## 2018-06-01 NOTE — Progress Notes (Signed)
Subjective:    Patient ID: Misty [name], female    DOB: 11-24-1940, 78 y.o.   MRN: [id number]  HPI  Here to f/u; overall doing ok,  Pt denies chest pain, increasing sob or doe, wheezing, orthopnea, PND, increased LE swelling, palpitations, dizziness or syncope.  Pt denies new neurological symptoms such as new headache, or facial or extremity weakness or numbness.  Pt denies polydipsia, polyuria, or low sugar episode.  Pt states overall good compliance with meds, mostly trying to follow appropriate diet, with wt overall stable,  but little exercise however.  Not checked BP at home recently.  Remains generally weak, and lost wt Wt Readings from Last 3 Encounters:  06/01/18 116 lb (52.6 kg)  05/03/17 124 lb (56.2 kg)  02/28/17 132 lb (59.9 kg)   Past Medical History:  Diagnosis Date  . Acute bronchitis 07/06/2008  . ALLERGIC RHINITIS 02/12/2008  . ANEMIA-IRON DEFICIENCY 02/12/2008  . Cancer of lower-inner quadrant of left female breast ([place]) 04/23/2016  . DIABETES MELLITUS, TYPE II 02/12/2008  . Facial cellulitis 01/01/2014  . FATIGUE 02/12/2008  . HYPERLIPIDEMIA 02/12/2008  . HYPERTENSION 11/05/2006  . KNEE PAIN, LEFT, ACUTE 05/11/2010  . LOW BACK PAIN 02/12/2008  . OSTEOPENIA 02/12/2008  . PEPTIC ULCER DISEASE 02/12/2008  . Shingles outbreak 01/02/2014  . SINUSITIS- ACUTE-NOS 10/08/2008   Past Surgical History:  Procedure Laterality Date  . ABDOMINAL HYSTERECTOMY    . APPENDECTOMY    . ESOPHAGOGASTRODUODENOSCOPY  1970    reports that she has quit smoking. She has never used smokeless tobacco. She reports current alcohol use. She reports that she does not use drugs. family history includes Colon polyps (age of onset: 78) in her brother; Coronary artery disease in her brother; Depression in her child; Hypertension in her brother; Mental illness in an other family member; Throat cancer in an other family member; Thyroid cancer in her brother. Allergies  Allergen Reactions  . Lipitor  [Atorvastatin Calcium] Nausea Only  . Lovastatin Nausea Only  . Simvastatin Other (See Comments)    gi upset  . Tramadol Other (See Comments)    sleepiness   Current Outpatient Medications on File Prior to Visit  Medication Sig Dispense Refill  . aspirin EC 81 MG tablet Take 81 mg by mouth daily with breakfast.    . ezetimibe (ZETIA) 10 MG tablet TAKE 1 TABLET (10 MG TOTAL) BY MOUTH DAILY. MUST KEEP SCHEDULED APPT FOR FUTURE REFILLS 30 tablet 5  . letrozole (FEMARA) 2.5 MG tablet Take 1 tablet (2.5 mg total) by mouth daily. 90 tablet 3  . valsartan-hydrochlorothiazide (DIOVAN-HCT) 320-25 MG tablet Take 1 tablet by mouth daily. 90 tablet 1  . amLODipine (NORVASC) 5 MG tablet Take 1 tablet (5 mg total) by mouth daily. 90 tablet 3   No current facility-administered medications on file prior to visit.    Review of Systems  Constitutional: Negative for other unusual diaphoresis or sweats HENT: Negative for ear discharge or swelling Eyes: Negative for other worsening visual disturbances Respiratory: Negative for stridor or other swelling  Gastrointestinal: Negative for worsening distension or other blood Genitourinary: Negative for retention or other urinary change Musculoskeletal: Negative for other MSK pain or swelling Skin: Negative for color change or other new lesions Neurological: Negative for worsening tremors and other numbness  Psychiatric/Behavioral: Negative for worsening agitation or other fatigue All other system neg per pt    Objective:   Physical Exam BP (!) 100/56   Pulse 81   Temp 97.6  F (36.4 C) (Oral)   Ht 5\' 3"  (1.6 m)   Wt 116 lb (52.6 kg)   SpO2 93%   BMI 20.55 kg/m  VS noted,  Constitutional: Pt appears in NAD HENT: Head: NCAT.  Right Ear: External ear normal.  Left Ear: External ear normal.  Eyes: . Pupils are equal, round, and reactive to light. Conjunctivae and EOM are normal Nose: without d/c or deformity Neck: Neck supple. Gross normal  ROM Cardiovascular: Normal rate and regular rhythm.   Pulmonary/Chest: Effort normal and breath sounds without rales or wheezing.  Abd:  Soft, NT, ND, + BS, no organomegaly Neurological: Pt is alert. At baseline orientation, motor grossly intact Skin: Skin is warm. No rashes, other new lesions, no LE edema Psychiatric: Pt behavior is normal without agitation  No other exam findings  Lab Results  Component Value Date   WBC 9.3 09/12/2015   HGB 12.2 09/12/2015   HCT 36.5 09/12/2015   PLT 316.0 09/12/2015   GLUCOSE 112 (H) 05/03/2017   CHOL 159 05/03/2017   TRIG 187.0 (H) 05/03/2017   HDL 42.50 05/03/2017   LDLDIRECT 165.0 09/12/2015   LDLCALC 80 05/03/2017   ALT 12 05/03/2017   AST 14 05/03/2017   NA 139 05/03/2017   K 3.8 05/03/2017   CL 100 05/03/2017   CREATININE 0.89 05/03/2017   BUN 25 (H) 05/03/2017   CO2 30 05/03/2017   TSH 1.52 05/03/2017   HGBA1C 6.3 03/09/2016   MICROALBUR 1.1 09/12/2015       Assessment & Plan:

## 2018-06-01 NOTE — Assessment & Plan Note (Signed)
Likely overcontrolled after wt loss, to decresae the amlod to 2.5 qd, and f/u BP at home, may be able to d/c if BP remains consistently < 120/80

## 2018-06-01 NOTE — Patient Instructions (Signed)
Ok to decrease the amlodipine to 2.5 mg per day  Please check your blood pressure on a regular basis, as if you are consistently lower the 120 on top number, we may be able to stop the amlodipine completely  Please call if you change your mind about the colonoscopy  Please continue all other medications as before, and refills have been done if requested.  Please have the pharmacy call with any other refills you may need.  Please continue your efforts at being more active, low cholesterol diet, and weight control.  You are otherwise up to date with prevention measures today.  Please keep your appointments with your specialists as you may have planned  Please go to the LAB in the Basement (turn left off the elevator) for the tests to be done today  You will be contacted by phone if any changes need to be made immediately.  Otherwise, you will receive a letter about your results with an explanation, but please check with MyChart first.  Please remember to sign up for MyChart if you have not done so, as this will be important to you in the future with finding out test results, communicating by private email, and scheduling acute appointments online when needed.  Please return in 1 year for your yearly visit, or sooner if needed, with Lab testing done 3-5 days before

## 2018-06-01 NOTE — Assessment & Plan Note (Signed)
Improved with last labs, for f/u lipid today, low chol diet

## 2018-06-06 NOTE — Unmapped (Deleted)
Follow-up outpatient evaluation    Referring Physician: Murrell Redden, MD    PCP: Corwin Levins, MD    Consulting Physicians: Surgical oncology: Lucretia Roers M.D.    First line: Ibrance + letrozole  Second line: consider PACE study (with Faslodex)   -----------------------------------------------------------------------------------------------------  Assessment/Plan:  I spent at least 40  minutes with this patient more than 50% in counseling. The following issues were discussed: Katherine Sherman is a 78 y.o. 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 NSCLC confirmed on bx of left hilar LN (11L).  She was on primary endocrine therapy (letrozole and tamoxifen) from 03/2016 until 03/2017, underwent left mastectomy on 04/26/2017 followed by postmastectomy radiation completed 07/2017 , and IV CMF x 8 cycles completed 01/16/2018.  She was then started on exemestane.    She underwent her annual CT chest for lung cancer surveillance and unfortunately was found to have several new lung masses in both lobes.  Largest was left hilar paramediastinal mass measuring 5 x 3 cm.  She underwent bronchoscopy for biopsy of the left hilar mass on 03/31/2018, biopsy was positive for metastatic adenocarcinoma morphologically similar to breast cancer with ER 60% +, PR 30% + and HER-2 negative.  She presents today to review imaging and biopsy results, her daughter (Amy) was notified by Dr. Curt Bears of the biopsy results on 04/06/2018.    We discussed her diagnosis, stage, pathology, and overall prognosis. We discussed the natural history of stage IV breast cancer and the standard approach to treating stage IV HR+ breast cancer. She was advised that while her disease is incurable, it is very treatable and many women live years on treatment. Our goals of therapy are to control your disease for as long as possible while minimizing side effects.  We discussed the plan for treatment with Ibrance + letrozole, her recurrence occurred on exemestane and will plan to hold Faslodex for second line treatment (consider the PACE clinical trial).  We discussed today the [id number] clinical trial for 70+ adults which would include the standard of care letrozole + Ibrance. Her daughter has concern over the cost of Ilda Foil, met with Aimee [place] today for teaching and discussion of grant/financial support for Ibrance. They would like to know insurance cost at which point Aimee will contact them to discuss grant and financial support options.    She needs to complete stating with CT a/p + Bone scan which we will get ASAP. She will stop the exemestane and start letrozole, will plan to start Ibrance once we get it approved. Will start Ibrance at 100 mg given her age and concern for tolerance. Will plan to have her get labs 2 weeks after Ibrance, and return in 4 weeks prior to next cycle of Ibrance.     PLAN:    Metastatic Breast Cancer to lung/hilar nodes (ER 60%, PR 30%, and HER2 -)  -- Treatment: first line letrozole + palbociclib 100 mg (d/t age), met with coordinator for the [id number] (70+) trial, hesitant about Ilda Foil given potential cost. 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 Started on Tamoxifen 03/04/17 with progression in 1 month.   -- Surgery: 04/26/17 Mastectomy and SLNE: 7.9 cm invasive ductal carcinoma with 2+ lymph nodes the largest deposit measuring 1.5 cm with extracapsular extension.   -- Chemotherapy: first generation CMF q21d x 8 cycles completed 01/12/18.   -- Radiation: Post-mastectomy radiation completed on 07/28/17.    Stage IIB [cT1cN1] - mucoepidermoid NSCLC 2.6 cm left hilar mass, with + left hilar Ln (N1) and negative N2, N3 LNs.   -- Completed tentative radiation to the left hilar mass between 08/10/16 and 08/24/16   -- Repeat systemic stating 05/30/17 with no concern for recurrence of distant metastasis.   -- scheduled for repeat CT chest 02/28/18 with Dr. Dawna Part f/u    Supportive care  --Cough: persistent for > 6 months. Per family feel that cough has gotten worse in last month. Dry, sometimes with scant sputum production. CTM  -- loss of appetite: weight stable at ~ 122 lbs. Eating more carbs/sugars, snacking throughout the day. CTM her weight closely  -- Pain: denies any pain today  -- Chest pain: on the left chest, just above her mastectomy incision. Mild tenderness to palpation without masses or skin changes. No bone mets on CT scan, not requiring medications.     Follow-up  -- Letrozole + palbociclib called in the pharmacy.  -- stop exemstane and start letrozole. Aimee with f/u with family re: palbo cost  -- CT a/p + Bone scan ASAP  -- RTC in 4 weeks after starting Ibrance.     Patient seen today with Dr. Archie Balboa, please see his attestation for additional visit details.     ADDENDUM 04/27/18: Bone scan showed new bone lesions. Right femur, left 7th, and L3 lesions. Given this, we will plan to initiate Xgeva at her next visit, orders placed today. CT a/p with increased nodularity of the right adrenal gland.- Carlos American, NP  ------------------------------------------------------------------------------------------------------------------------------------  No chief complaint on file.      History of the Present Illness: Katherine Sherman is a pleasant 78 y.o. female with history of HTN, HDL and at least mild cognitive impairment who was recently diagnosed with at least Stage III breast cancer and started on neoadjuvant endocrine therapy with letrozole. She was started on letrozole by her local oncologist since  04/27/2016. She was not a candidate for ALTERNATE trial since she had already been started on endocrine therapy. We performed staging bone and CT scans which showed a 2.6 cm left hilar mass, multiple small left lower lobe lung nodules up to 1 cm, and multiple hypodensities in the liver that are indeterminate up to 1.cm. Bone scan was negative. MRI brain ruled out brain mets. Imaging discussed at [facility] and IP performed EBUS with mediastinal/ hilar LN biopsy and an 11L (left hilar) LN was  positive for NSCLC - mucoepidermoid carcinoma. At least Stage IIB [cT1c,N1] NSCLC. Given the cognitive impairment and mildly limited functional status she was felt to not be a got surgical candidate and therefore definitive radiation therapy was recommended. It remains unclear what the other LLL pulmonary nodules and the indeterminate hepatic hypodensities represent (lung cancer, breast cancer or benign).    Initially she  was started on primary endocrine therapy with letrozole.  Consideration for neoadjuvant TC however given low rates of PCR and cognitive impairment making it difficult to tolerate chemo elected to continue with primary endocrine therapy.  Initially had response, in 12/2016 developed a new left axillary lymph node, on exam in December 2018 found to have a 1 cm of erosion through the skin. She underwent left mastectomy and sentinel lymph node evaluation on 04/26/17. Final pathology showed a 7.9 cm invasive ductal carcinoma with 2/10 + lymph nodes the largest deposit measuring 1.5 cm with extracapsular extension. Restaging CT Chest/Abd 06/09/17 generally stable compared to prior with no definitive evidence of metastatic disease. Given the concern for local control with rapid progression on primary endocrine therapy, elected to undergo radiation to the chest wall and nodal regions initially in the adjuvant setting.  She completed radiation on 07/29/2017. Started on CMF completed 12/2017. On 03/10/18 CT chest with new lung massess and hilar mass (5 x 3 cm), bx confirmed metastatic breast cancer ER 60%, PR 30% and HER2-.     Interval history:   Accompanied today by her daughter (Amy) and son-in-law. Sometimes here with her son Kathlene November). Here today to review scans and biopsy results  -- she has had a cough > 6 months, felt to be post-nasal drip. Her daughter thinks the dry hacking has been worse in the last month. She sometimes has scants amount of sputum when she coughs. Denies any SOB or DOE.  -- fatigue is mild, she is not very active during the day despite encouragement from her daughter (whom she lives with)  -- left chest pain, just above the mastectomy scar. First noted a few weeks ago. Mild tenderness on exam. She has no masses or skin changes overlying this area. Has not been taking tylenol or ibuprofen for this as it is only bothersome intermittently.  -- loss of appetite, her daughter reports she has been eating unhealthy only wanting a bund of carbs and sugar. Her weight has been stable in the last 3 months (~ 122 lbs)  -- she otherwise denies any new pains. No nausea, vomiting, early satiety. No abdominal pain.     Review of Systems: A complete twelve systems review was obtained and is positive per the HPI but otherwise negative in detail. See MIMS #1170 where available.    Functional Status:  ECOG PS = 1 independent ADLs.  Requires assistance with IADLs.    Oncology History    STRATA  ESR1 p.D538G  [id number]  Estimated variant allele frequency:  25%  TP53 p.V272L  [id number].[id number]>C  Estimated variant allele frequency:  43%  MSS  Microsatellite Stable  TMB - Low  Mutations per MB: 3  Confidence interval: 1 - 8  PD-L1 - Low  RNA expression score: 14  The estimated tumor content was  less than 50%, potentially increasing  representation of PD-L1 expression  from non-tumor cells  Strata Immune Signature - Low        Malignant neoplasm of left breast in female, estrogen receptor positive (CMS-HCC)    03/2016 -  Presenting Symptoms     6 months of noticing left breast mass. Eventually got MMG which showed a 6 cm Left beast mass.       04/09/2016 Biopsy     IDC with DCIS, ER +, PR+., HER2 neg, Ki67 12%.       04/09/2016 Initial Diagnosis     Malignant neoplasm of left breast in female, estrogen receptor positive (RAF-HCC).  04/22/2016 -  Cancer Staged     MRI breast b/l:  inner lower quadrant left breast  measuring 4.8 x 4.6 x 6.5 cm with evidence of pectoralis muscle invasion, but no involvement of underlying chest wall. Abnormal left internal mammary lymph node measuring 9 x 16 mm. (cN2b ipsilat int mammary without axillary LN). No suspicious axillary adenopathy.    [place] read of this MRI: 2 left internal mammary nodes 1.6 and 0.6 cm, Left level 1 axillary node 1.3 cm     This makes it cN3b (ispilateral internal mammary and axillary LN)    However [place] read of targeted left axillary US showed no abnormal LN.     Stage IIIA vs IIIC [T3N2b vs N3c]          04/27/2016 -  Other     Oncotype DX testing sent by primary oncologist, Dr Pamelia Hoit at [facility]  to guide decision for neoadjuvant chemotherapy vs endocrine therapy. RS = 19.       04/27/2016 Endocrine/Hormone Therapy     Started on Neoadjuvant Endocrine therapy with Letrazole       06/17/2016 -  Cancer Staged     CT C/A/P:   2.6 cm left hilar mass, several LLL nodules up to 1.0 cm and a 2.5 cm LLL nodule. Several liver hypodensities up to 1.0 cm - indeterminate.  Known left breast mass 5.5 cm involving the pectoralis muscle.     Bone scan negative. Brain MRI negative.         Malignant neoplasm of overlapping sites of lung (CMS-HCC)    06/17/2016 -  Cancer Staged     CT C/A/P: 2.6 cm left hilar mass, several LLL nodules up to 1.0 cm and a 2.5 cm LLL nodule. Several liver hypodensities up to 1.0 cm - indeterminate.  Known left breast mass.     MRI brain - negative        06/30/2016 Biopsy     EBUS: N2, N3 Ln negative . 11L LN + for mucoepidermoid NSCLC.       07/06/2016 Initial Diagnosis     Malignant neoplasm of overlapping sites of lung (CMS-HCC). 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 Metastatic adenocarcinoma    The previous lung carcinoma ([id number]) demonstrating a mucoepidermoid of the lung is reviewed as well as the previous ductal carcinoma of the breast ([id number]???[id number]) and the current malignancy is morphologically similar to the breast carcinoma. ??Panel of immunohistochemical stains also supports that the metastatic adenocarcinoma is of breast origin based on positive staining of the malignant cells in the cell block for Gata-3, estrogen receptor protein (moderate to strong staining, 60%), and progesterone receptor protein (mild to moderate staining, 30%) with 0 negative staining for HER-2/neu. ??Immunohistochemical stain for P 40 as would be expected in an muco-epidermoid carcinoma is negative.         Patient Active Problem List   Diagnosis   ??? Malignant neoplasm of left breast in female, estrogen receptor positive (CMS-HCC)   ??? Essential hypertension   ??? Borderline diabetes   ??? Osteopenia of multiple sites   ??? Malignant neoplasm of overlapping sites of lung (CMS-HCC)   ??? Dementia without behavioral disturbance (CMS-HCC)   ??? 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  Neoplastic malignant related fatigue   ??? Thyroid dysfunction   ??? Allergic rhinitis   ??? Weight loss       Past Medical History:   Diagnosis Date   ??? Borderline diabetes    ??? Breast cancer (CMS-HCC) 03/2016    left   ??? Forgetfulness    ??? HTN (hypertension)    ??? Hyperlipemia ??? Lung cancer (CMS-HCC)          Past Surgical History:   Procedure Laterality Date   ??? BREAST BIOPSY Left 03/2016    br ca   ??? HYSTERECTOMY  1979   ??? PR BRNCHSC EBUS GUIDED SAMPL 1/2 NODE STATION/STRUX N/A 03/31/2018    Procedure: Bronch, Rigid Or Flexible, Inc Fluoro Guidance, When Performed; With Ebus Guided Transtracheal And/Or Transbronchial Sampling, One Or Two Mediastinal And/Or Hilar Lymph Node Stations Or Structures;  Surgeon: Sherwood Gambler, MD;  Location: MAIN OR [facility];  Service: Pulmonary   ??? PR BRNCHSC EBUS GUIDED SAMPL 3/> NODE STATION/STRUX N/A 06/30/2016    Procedure: Bronch, Rigid Or Flexible, Including Fluoro Guidance, When Performed; W Ebus Guided Transtracheal And/Or Transbronchial Sampling, 3 Or More Mediastinal And/Or Hilar Lymph Node Stations Or Structures;  Surgeon: Sherwood Gambler, MD;  Location: MAIN OR [facility];  Service: Pulmonary   ??? PR BRONCHOSCOPY,BIOPSY N/A 03/31/2018    Procedure: Bronchoscopy, Rigid/Flexible, Include Fluoro Guide When Performed; W/Bronch/Endobronch Bx, Single/Mult Site;  Surgeon: Sherwood Gambler, MD;  Location: MAIN OR [facility];  Service: Pulmonary   ??? PR MASTECTOMY, RADICAL, URBAN TYPE Left 04/26/2017    Procedure: Mastectomy, Radical, Including Pectoral Muscles, Axillary And Internal Mammary Lymph Nodes (Urban Type Op);  Surgeon: Aris Everts, MD;  Location: MAIN OR [facility];  Service: Surgical Oncology Breast   ??? RADIATION Left     x 1 month       Gyn History:  G4P4. No h/o HRT. Had OCP for 30 years.  Attained menopause at 78 years old with partial hysterectomy 2/2 adheshions. Ovaries left in.     Medications:  Current Outpatient Medications   Medication Sig Dispense Refill   ??? amLODIPine (NORVASC) 5 MG tablet Take 5 mg by mouth daily.     ??? benzonatate (TESSALON PERLES) 100 MG capsule Take 1 capsule (100 mg total) by mouth Three (3) times a day as needed for cough. (Patient not taking: Reported on 03/28/2018) 30 capsule 1   ??? ezetimibe (ZETIA) 10 mg tablet Take 10 mg by mouth daily.     ??? fluticasone propionate (FLONASE) 50 mcg/actuation nasal spray 1 spray by Each Nare route daily. (Patient not taking: Reported on 03/28/2018) 16 g 0   ??? ibuprofen (ADVIL,MOTRIN) 200 MG tablet Take 200 mg by mouth daily as needed for pain.     ??? letrozole (FEMARA) 2.5 mg tablet Take 1 tablet (2.5 mg total) by mouth daily. 90 tablet 3     No current facility-administered medications for this visit.        Allergies:  No Known Allergies    Family History:  Cancer-related family history includes Cancer in her daughter. There is no history of Breast cancer or Ovarian cancer.  1 living brother. Twin sister died after birth.  Another brother died in 77 Of heart disease. No h/o breast cancer.  Daughter died at age 85 with GIST.     Social History:   Social History     Social History Narrative    She lives with her daughter Linton Rump, son-in-law Michele Mcalpine, and her grandson  in [place], [place] (lived independently until Summer 2018). 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 She is divorced. She has four children - one deceased (metastatic GIST 2015-06-30), three in [place] (Amy, [place] in [place], and [place] in [place]). Denies smoking, EtOH. She requires assistance in all iADLs but is independent in ADLs.        Physical Examination:  Vital Signs:There were no vitals taken for this visit.  General: Frail-appearing patient in no acute distress.  HEENT: PERRTLA, EOMI, OP clear  Cardiovascular: Normal S1 and S2. RRR. No audible murmurs.  Respiratory: Chest clear to percussion and auscultation.   Gastrointestinal: Abdomen soft without masses and tenderness, no hepatosplenomegaly or other masses.   Musculoskeletal: No bony pain or tenderness over spine or hips.  Skin: No rash, ecchymoses or purpuric lesions noted.  Breasts: Right: Normal. Left breast: Status post mastectomy w/ well-healed incision extending into left axilla, resolving hyperpigmentation 2/2 radiation. Mild tenderness on left chest wall just below clavicle, no palpable masses or skin changes.  Neurologic: Alert and oriented. Grossly non-focal  Lymphatic: No cervical, axillary or supraclavicular adenopathy.   Extremity: No lower extremity edema or upper extremity lymphedema      DATA REVIEW:    Laboratory:     Lab Results   Component Value Date    WBC 6.1 04/10/2018    HGB 10.6 (L) 04/10/2018    HCT 32.7 (L) 04/10/2018    PLT 323 04/10/2018       Lab Results   Component Value Date    NA 137 04/10/2018    K 3.9 04/10/2018    CL 100 04/10/2018    CO2 27.0 04/10/2018    BUN 23 (H) 04/10/2018    CREATININE 0.76 04/10/2018    GLU 104 04/10/2018    CALCIUM 9.5 04/10/2018       Lab Results   Component Value Date    BILITOT 0.3 04/10/2018    PROT 7.6 04/10/2018    ALBUMIN 3.9 04/10/2018    ALT 11 04/10/2018    AST 20 04/10/2018    ALKPHOS 84 04/10/2018

## 2018-06-09 DIAGNOSIS — C50919 Malignant neoplasm of unspecified site of unspecified female breast: Principal | ICD-10-CM

## 2018-06-09 NOTE — Unmapped (Signed)
Hi,     Amy, patient's daughter contacted the Communication Center requesting to speak with the care team of Katherine Sherman to discuss:    Called to follow up with the care team in regards to a recent MyChart conversation that was had with Katherine Sherman about her mother's upcoming appointments on 3/16. Amy stated that she would prefer for her mother to have labs done at local facility in [place] as she has been able to do in the past.     She stated that that she is aware of the importance of the 2 week follow up's with Dr. Archie Balboa but assures that her mother is doing well. Amy feels that it should be okay for her mother to start her next round of meds with out the 2 week check up. She is concerned about her mother traveling to [facility] and visiting the hospital due to the corona virus. Her voiced that her mother is old with a weak immune system and she is unsure about bringing her to the hospital    She is also requesting a call back from Dr. Archie Balboa to confirm if the appointments are absolutely necessary.     Please contact Amy at [phone].    Program: Breast  Speciality: Medical Oncology    Check Indicates criteria has been reviewed and confirmed with the patient:    [x]  Preferred Name   [x]  DOB and/or MR#  [x]  Preferred Contact Method  [x]  Phone Number(s)   []  MyChart     Thank you,   Jannette Spanner  [facility]   [phone]

## 2018-06-09 NOTE — Unmapped (Signed)
Already sent local lab orders, and spoke with pts daughter through [facility], about cancelling appt Monday and pharmacist calling her Tuesday with lab results, and seeing how patient is doing. EMR

## 2018-06-14 DIAGNOSIS — C3482 Malignant neoplasm of overlapping sites of left bronchus and lung: Principal | ICD-10-CM

## 2018-06-14 NOTE — Unmapped (Addendum)
Pharmacist Oral Chemotherapy Telephone Follow-Up    Assessment and recommendations:  [name] is neutropenic (ANC = 0.9) and need to hold palbociclib x 1 week.  Labs at [facility] on Monday 3/23.  ___________________________________________________________________________  HPI: [name] is a 78 y.o. female with metastatic breast cancer who I am following while on oral chemotherapy.      Oral Chemotherapy: Palbociclib 100 mg po daily x 21 days then 7 days off  Start date: 2//17/20  Pharmacy:  Geneticist, molecular Assistance program    Interim History: [name] had C2D1 follow-up labs for palbociclib on 3/16.  I spoke with her daughter, Katherine Sherman.  [name] ANC is 0.9 so she can't start the next cycle.  I instructed her to not start palbociclib and to get repeat CBC/diff on Monday, 3/23.  I will call her next week to let her know if her mother can start Cycle 2.    Lab orders faxed to [facility] in [place] (Fax [phone]; T [phone]).      Side effects:  Katherine Sherman reports that her mother has more fatigue.    Patient's daughter verbalized understanding

## 2018-06-20 NOTE — Unmapped (Signed)
Pharmacist Oral Chemotherapy Telephone Follow-Up    Assessment and recommendations:  Ms. Mayford Knife had repeat labs at [facility] on Monday (3/23) after holding palbociclib x 1 week due to neutropenia.  Her ANC is now adequate (ANC=2.5) for her to start cycle 2.  Patient to start today (3/24).      Follow-up currently scheduled in 4 weeks with clinic visit and labs.  I recommend to not get labs in 2 weeks to promote social distancing of this elderly patient.    If in 4 weeks, patient's ANC is <1 and/or has considerable fatigue, I would recommend to dose reduce to 75 mg.  ___________________________________________________________________________  HPI: Ms. Mayford Knife is a 78 y.o. female with metastatic breast cancer who I am following while on oral chemotherapy.      Oral Chemotherapy: Palbociclib 100 mg po daily x 21 days then 7 days off  Start date: 2//17/20  Pharmacy:  Geneticist, molecular Assistance program  Cycle 2 start date: 06/20/2018    Interim History: Ms. Mayford Knife had C2D1  labs for palbociclib on 3/16 and she was neutropenic.  I spoke with her daughter, Linton Rump, and Ms. Gell's did hold palbociclib x 1 week as instructed.  Labs from yesterday at [facility] (3/23) are uploaded to the media tab.  Her ANC has increased to 2.5.  Amy reports that her mother has generally been doing okay and has been eating when encouraged.  She does note that her mother seems more fatigued over the past month.  She did not notice much difference with an extra week off of palbociclib with her energy levels.    Lab orders from [facility] in [place] (Fax [phone]; T [phone]).      Side effects:  Amy reports that her mother continues to have more fatigue and seems to be sleeping more.    Patient's daughter verbalized understanding    Palbocilcib Time line  C1D1 Start 2/17  C2 D1: Delayed by one week due to neutropenia.  Start on 3/24.

## 2018-07-14 NOTE — Unmapped (Signed)
Left a message about pt appts for Monday. Patients are no longer able to bring any visitors with them to their appointment unless needed for safety reason. All patients will be screened and given a mask prior to entering the building. Instructed patient to call if they develop any symptoms overnight (cough, shortness of breath, fever 100+) or if you have any questions or concerns please give [place] a call at [phone]. Darleen Crocker, CMA

## 2018-07-18 ENCOUNTER — Ambulatory Visit: Admit: 2018-07-18 | Discharge: 2018-07-19 | Payer: MEDICARE | Attending: Pharmacist | Primary: Pharmacist

## 2018-07-18 MED ORDER — PALBOCICLIB 75 MG TABLET
ORAL_TABLET | Freq: Every day | ORAL | 3 refills | 0.00000 days | Status: CP
Start: 2018-07-18 — End: 2018-09-20

## 2018-07-18 NOTE — Unmapped (Signed)
Pharmacist Oral Chemotherapy Telephone Follow-Up    Assessment and recommendations:  Katherine Sherman had Cycle 3 day 1 labs at [facility] on Monday (4/20).  She is neutropenic (ANC=0.8) for the second consecutive cycle.  She will hold palbociclib x 1 week.  Plan to restart once ANC >1 with dose reduction to 75 mg.    Plan:  1. Hold Cycle 3 palbociclib due to neutropenia  2. Recheck CBC/diff at [facility] on 4/27  3. If ANC > 1 on 4/27, start palbociclib with dose reduced to 75 mg (New prescription sent to [facility])  ___________________________________________________________________________  HPI: Katherine Sherman is a 78 y.o. female with metastatic breast cancer who I am following while on oral cheeumotherapy.      Oral Chemotherapy:    07/24/18: Palbociclib 75 mg po daily x 21 days then 7 days off (dose reduced starting Cycle 3)   05/15/18: Palbociclib 100 mg po daily x 21 days then 7 days off  Start date: 2//17/20  Pharmacy:  [facility]  Cycle 2 start date: 06/20/2018    Interim History: Katherine Sherman had C3D1 labs for palbociclib on 4/20.  Her ANC is 0.8 and not adequate to start this next cycle.  Overall her mother is stable and no changes reported since our last conversation.  I explained to the daughter that her mother will need to delay the next cycle for one week.  She will have her labs redrawn at [facility] on 4/27.  I will f/u with her next week and let her know if her ANC is adequate to re-start.  Since she has been neutropenic for 2 consecutive cycles, She will restart the medication at a reduced dose of 75 mg.  Prescription for palbociclib 75 mg escribed to [facility]).    Side effects:   Fatigue:  Daughter reports that her mother may be more fatigued than before starting palbociclib.  Overall, energy levels are stable and have not worsened.  No other adverse effects .    Adherence: No missed doses    Drug interactions: No new medications to assess.    Patient's daughter verbalized understanding    Lab orders from [facility] in [place] (Fax [phone]; T [phone]).  Uploaded in media tab.    Palbocilcib Time line  C1D1 Start 2/17  C2 D1: Delayed by one week due to neutropenia.  Start on 3/24.  C3 D1  Delayed by one week due to neutropenia.      Approximate time spent on phone with the patient: 15 minutes

## 2018-07-25 NOTE — Unmapped (Signed)
Pharmacist Oral Chemotherapy Telephone Follow-Up    Assessment and recommendations:  Cycle 3 of palbociclib held x 1 week due to neutropenia.  .Her ANC has recovered to 1.7 so she will start dose reduced palbociclib (75 mg) today.    Plan:  1. Start dose reduced palbociclib (75 mg) today  2. CBC/diff at [facility] on 5/11 and I will call her with results on 5/12  3. Scans 5/14 and 5/15 and phone visit with Dr. Archie Balboa on 5/18  4. CBC/diff at [facility] 5/25  ___________________________________________________________________________  HPI: Katherine Sherman is a 78 y.o. female with metastatic breast cancer who I am following while on oral cheeumotherapy.      Oral Chemotherapy:    07/25/18: Palbociclib 75 mg po daily x 21 days then 7 days off   05/15/18: Palbociclib 100 mg po daily x 21 days then 7 days off  Start date: 2//17/20  Pharmacy:  [facility]  Cycle 2 start date: 06/20/2018    Interim History: Katherine Sherman held Cycle 3 of palbociclib due to neutropenia x 1 week.  ANC is now adequate for her to start the next cycle.  Since she had to hold two consecutive cycles due to neutropenia, she will be dose reduced to 75 mg.  Patient has received the 75 mg tablets from [facility].  Her hemoglobin has decreased to 9.2.  Will continue to monitor.    Patient's daughter verbalized understanding    Lab orders from [facility] in [place] (Fax [phone]; T [phone]).  Uploaded in media tab.    Palbocilcib Time line  C1D1 Start 2/17  C2 D1: Delayed by one week due to neutropenia.  Start on 3/24.  C3 D1  Delayed by one week due to neutropenia.  Start 75 mg on 4/28    Approximate time spent on phone with the patient: 10 minutes

## 2018-08-08 NOTE — Unmapped (Signed)
Pharmacist Oral Chemotherapy Telephone Follow-Up    Assessment and recommendations:  Cycle 3 day 15 follow-up of palbociclib.  ANC is adequate for Katherine Sherman to continue her current cycle of palbociclib 75 mg.  Cycle 4 due to start on 08/22/18.    Plan:  1. Scans 5/15 and phone visit with Dr. Archie Balboa on 5/18  2. CBC/diff at [facility] 5/25  ___________________________________________________________________________  HPI: Katherine Sherman is a 78 y.o. female with metastatic breast cancer who I am following while on oral cheeumotherapy.      Oral Chemotherapy:    07/25/18: Palbociclib 75 mg po daily x 21 days then 7 days off   05/15/18: Palbociclib 100 mg po daily x 21 days then 7 days off  Start date: 2//17/20  Pharmacy:  [facility]  Cycle 3 start date: 07/25/2018    Interim History: Katherine Sherman had C3D15 labs at [facility] on 5/11.  Her ANC is 2.1.  Katherine Sherman reports her mother is doing the same and she has no new side effects or concerns.    Adherence:  No missed doses    Patient's daughter verbalized understanding    Lab orders from [facility] in [place] (Fax [phone]; T [phone]).  Uploaded in media tab.    Palbocilcib Time line  C1D1 Start 2/17  C2 D1: Delayed by one week due to neutropenia.  Start on 3/24.  C3 D1  Delayed by one week due to neutropenia.  Start 75 mg on 4/28  C4 D1 08/22/2018    Approximate time spent on phone with the patient: 10 minutes

## 2018-08-11 ENCOUNTER — Ambulatory Visit: Admit: 2018-08-11 | Discharge: 2018-08-15 | Payer: MEDICARE

## 2018-08-11 NOTE — Unmapped (Deleted)
LVM for patient to note any medication changes in her chart during the phone appointment this coming Monday.

## 2018-08-14 NOTE — Unmapped (Deleted)
Follow-up outpatient evaluation    Referring Physician: Murrell Redden, MD    PCP: Corwin Levins, MD    Consulting Physicians: Surgical oncology: Lucretia Roers M.D.    I spent *** minutes on the {phone audio video visit:[id number]} with the patient. I spent an additional *** minutes on pre- and post-visit activities.     The patient was physically located in [place] or a state in which I am permitted to provide care. The patient and/or parent/gauardian understood that s/he may incur co-pays and cost sharing, and agreed to the telemedicine visit. The visit was completed via phone and/or video, which was appropriate and reasonable under the circumstances given the patient's presentation at the time.    The patient and/or parent/guardian has been advised of the potential risks and limitations of this mode of treatment (including, but not limited to, the absence of in-person examination) and has agreed to be treated using telemedicine. The patient's/patient's family's questions regarding telemedicine have been answered.     If the phone/video visit was completed in an ambulatory setting, the patient and/or parent/guardian has also been advised to contact their provider???s office for worsening conditions, and seek emergency medical treatment and/or call 911 if the patient deems either necessary.    First line: Ibrance + letrozole  Second line: consider PACE study (with Faslodex)   -----------------------------------------------------------------------------------------------------  Assessment/Plan:  I spent at least 40  minutes with this patient more than 50% in counseling. The following issues were discussed: Katherine Sherman is a 78 y.o. 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 NSCLC confirmed on bx of left hilar LN (11L).  She was on primary endocrine therapy (letrozole and tamoxifen) from 03/2016 until 03/2017, underwent left mastectomy on 04/26/2017 followed by postmastectomy radiation completed 07/2017 , and IV CMF x 8 cycles completed 01/16/2018.  She was then started on exemestane.    She underwent her annual CT chest for lung cancer surveillance and unfortunately was found to have several new lung masses in both lobes.  Largest was left hilar paramediastinal mass measuring 5 x 3 cm.  She underwent bronchoscopy for biopsy of the left hilar mass on 03/31/2018, biopsy was positive for metastatic adenocarcinoma morphologically similar to breast cancer with ER 60% +, PR 30% + and HER-2 negative.      Staging CT AP demonstrated increased nodularity of right adrenal gland, concerning for met.  Bone scan showed new bone lesions in right femur, left 7th rib, and L3 spine, so plan was for Xygeva in addition to systemic therapy to be initiated at next follow up***; however, this has yet to be done.      Patient was counseled that goal of treatment would be to control disease while minimizing side effects.  She was initiated on palbociclib with letrozole for stage IV HR+ breast cancer (C1D1 = 05/15/18).  C3 delayed by one week due to neutropenia. Now on dose-reduced palobciclib (75 mg/day) with letrozole.  She presents now for follow up.  Of note, she was due for re-staging scans with CT CAP and bone scan p/t today's visit; however, this has yet to be  completed ***.        Overall, ***       PLAN:    Metastatic Breast Cancer to lung/hilar nodes (ER 60%, PR 30%, and HER2 -)  -- Treatment: first-line palbocilib + ibrance (C1D1 = 05/15/18).    -- C3 dose reduced to palbocilib 75 mg/day d/t neutropenia  -- Plan to continue with C4 of DR palbociclib 75 mg as scheduled   -- Has met with coordinator for the [id number] (70+) trial, hesitant about Ibrance given potential cost.   -- Genomics: STARTA and HARMONY today  -- Imaging: ***DUE FOR CT CAP and bone scan  -- Bone Support: ***NEEDS monthly Xgeva  -- Considerations for next line therapy: PACE trial (with Faslodex) if available     Left Breast Cancer: Stage IB [pT3, pN1b] IDC, grade 2, ER 95%, PR 70%, HER negative by FISH  -- Neoadjuvant endocrine therapy: 03/2016 letrozole, clinical progression 02/2017. Started on Tamoxifen 03/04/17 with progression in 1 month.   -- Surgery: 04/26/17 Mastectomy and SLNE: 7.9 cm invasive ductal carcinoma with 2+ lymph nodes the largest deposit measuring 1.5 cm with extracapsular extension.   -- Chemotherapy: first generation CMF q21d x 8 cycles completed 01/12/18.   -- Radiation: Post-mastectomy radiation completed on 07/28/17.    Stage IIB [cT1cN1] - mucoepidermoid NSCLC 2.6 cm left hilar mass, with + left hilar Ln (N1) and negative N2, N3 LNs.   -- Completed tentative radiation to the left hilar mass between 08/10/16 and 08/24/16   -- Repeat systemic stating 05/30/17 with no concern for recurrence of distant metastasis.   -- CT chest 02/2018 with new bilateral thoracic mets and internal mammary adenopathy, biopsy confirmed as metastatic breast cancer  -- Continue with surveillance imaging for metastatic breast cancer    Supportive care  --Cough: persistent for > 6 months. Per family feel that cough has gotten worse in last month. Dry, sometimes with scant sputum production. CTM  -- Loss of appetite: weight stable at ~ 122 lbs. Eating more carbs/sugars, snacking throughout the day. CTM her weight closely  -- Pain: denies any pain today  -- Chest pain: on the left chest, just above her mastectomy incision. Mild tenderness to palpation without masses or skin changes. No bone mets on CT scan, not requiring medications. ***    Follow-up  -- Obtain CT CAP + Bone scan ASAP  -- Phone visit with Aimee Faso in 4 weeks***  -- RTC in ???    Patient seen via virtual visit with Dr. Archie Balboa.      [name], MD  Hematology & Oncology Fellow, PGY4  ------------------------------------------------------------------------------------------------------------------------------------  No chief complaint on file.      History of the Present Illness: Katherine Sherman is a pleasant 78 y.o. female with history of HTN, HDL and at least mild cognitive impairment who was recently diagnosed with at least Stage III breast cancer and started on neoadjuvant endocrine therapy with letrozole. She was started on letrozole by her local oncologist since  04/27/2016. She was not a candidate for ALTERNATE trial since she had already been started on endocrine therapy. We performed staging bone and CT scans which showed a 2.6 cm left hilar mass, multiple small left lower lobe lung nodules up to 1 cm, and multiple hypodensities in the liver that are indeterminate up to 1.cm. Bone scan was negative. MRI brain ruled out brain mets. Imaging discussed at [facility] and IP performed EBUS with mediastinal/ hilar LN biopsy and an 11L (left hilar) LN  was  positive for NSCLC - mucoepidermoid carcinoma. At least Stage IIB [cT1c,N1] NSCLC. Given the cognitive impairment and mildly limited functional status she was felt to not be a got surgical candidate and therefore definitive radiation therapy was recommended. It remains unclear what the other LLL pulmonary nodules and the indeterminate hepatic hypodensities represent (lung cancer, breast cancer or benign).    Initially she was started on primary endocrine therapy with letrozole.  Consideration for neoadjuvant TC however given low rates of PCR and cognitive impairment making it difficult to tolerate chemo elected to continue with primary endocrine therapy.  Initially had response, in 12/2016 developed a new left axillary lymph node, on exam in December 2018 found to have a 1 cm of erosion through the skin. She underwent left mastectomy and sentinel lymph node evaluation on 04/26/17. Final pathology showed a 7.9 cm invasive ductal carcinoma with 2/10 + lymph nodes the largest deposit measuring 1.5 cm with extracapsular extension. Restaging CT Chest/Abd 06/09/17 generally stable compared to prior with no definitive evidence of metastatic disease. Given the concern for local control with rapid progression on primary endocrine therapy, elected to undergo radiation to the chest wall and nodal regions initially in the adjuvant setting.  She completed radiation on 07/29/2017. Started on CMF completed 12/2017. On 03/10/18 CT chest with new lung massess and hilar mass (5 x 3 cm), bx confirmed metastatic breast cancer ER 60%, PR 30% and HER2-.     Interval history:   Accompanied today by her daughter (Amy) and son-in-law. Sometimes here with her son Kathlene November). Here today to review scans and biopsy results  -- she has had a cough > 6 months, felt to be post-nasal drip. Her daughter thinks the dry hacking has been worse in the last month. She sometimes has scants amount of sputum when she coughs. Denies any SOB or DOE.  -- fatigue is mild, she is not very active during the day despite encouragement from her daughter (whom she lives with)  -- left chest pain, just above the mastectomy scar. First noted a few weeks ago. Mild tenderness on exam. She has no masses or skin changes overlying this area. Has not been taking tylenol or ibuprofen for this as it is only bothersome intermittently.  -- loss of appetite, her daughter reports she has been eating unhealthy only wanting a bund of carbs and sugar. Her weight has been stable in the last 3 months (~ 122 lbs)  -- she otherwise denies any new pains. No nausea, vomiting, early satiety. No abdominal pain.     Review of Systems: A complete twelve systems review was obtained and is positive per the HPI but otherwise negative in detail. See MIMS #1170 where available.    Functional Status:  ECOG PS = 1 independent ADLs.  Requires assistance with IADLs.    Oncology History    STRATA  ESR1 p.D538G  [id number]  Estimated variant allele frequency:  25%  TP53 p.V272L  [id number]:c.814G>C  Estimated variant allele frequency:  43%  MSS  Microsatellite Stable  TMB - Low  Mutations per MB: 3  Confidence interval: 1 - 8  PD-L1 - Low  RNA expression score: 14  The estimated tumor content was  less than 50%, potentially increasing  representation of PD-L1 expression  from non-tumor cells  Strata Immune Signature - Low    HARMONY  Luminal B        Malignant neoplasm of left breast in female, estrogen receptor positive (CMS-HCC)  03/2016 -  Presenting Symptoms     6 months of noticing left breast mass. Eventually got MMG which showed a 6 cm Left beast mass.       04/09/2016 Biopsy     IDC with DCIS, ER +, PR+., HER2 neg, Ki67 12%.       04/09/2016 Initial Diagnosis     Malignant neoplasm of left breast in female, estrogen receptor positive (RAF-HCC).       04/22/2016 -  Cancer Staged     MRI breast b/l:  inner lower quadrant left breast  measuring 4.8 x 4.6 x 6.5 cm with evidence of pectoralis muscle invasion, but no involvement of underlying chest wall. Abnormal left internal mammary lymph node measuring 9 x 16 mm. (cN2b ipsilat int mammary without axillary LN). No suspicious axillary adenopathy.    [place] read of this MRI: 2 left internal mammary nodes 1.6 and 0.6 cm, Left level 1 axillary node 1.3 cm     This makes it cN3b (ispilateral internal mammary and axillary LN)    However [place] read of targeted left axillary US showed no abnormal LN.     Stage IIIA vs IIIC [T3N2b vs N3c]          04/27/2016 -  Other     Oncotype DX testing sent by primary oncologist, Dr Pamelia Hoit at [facility]  to guide decision for neoadjuvant chemotherapy vs endocrine therapy. RS = 19.       04/27/2016 Endocrine/Hormone Therapy     Started on Neoadjuvant Endocrine therapy with Letrazole       06/17/2016 -  Cancer Staged     CT C/A/P:   2.6 cm left hilar mass, several LLL nodules up to 1.0 cm and a 2.5 cm LLL nodule. Several liver hypodensities up to 1.0 cm - indeterminate.  Known left breast mass 5.5 cm involving the pectoralis muscle.     Bone scan negative. Brain MRI negative.         Malignant neoplasm of overlapping sites of lung (CMS-HCC)    06/17/2016 -  Cancer Staged     CT C/A/P: 2.6 cm left hilar mass, several LLL nodules up to 1.0 cm and a 2.5 cm LLL nodule. Several liver hypodensities up to 1.0 cm - indeterminate.  Known left breast mass.     MRI brain - negative        06/30/2016 Biopsy     EBUS: N2, N3 Ln negative . 11L LN + for mucoepidermoid NSCLC.       07/06/2016 Initial Diagnosis     Malignant neoplasm of overlapping sites of lung (CMS-HCC). 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 Metastatic adenocarcinoma    The previous lung carcinoma ([id number]) demonstrating a mucoepidermoid of the lung is reviewed as well as the previous ductal carcinoma of the breast ([id number]???[id number]) and the current malignancy is morphologically similar to the breast carcinoma. ??Panel of immunohistochemical stains also supports that the metastatic adenocarcinoma is of breast origin based on positive staining of the malignant cells in the cell block for Gata-3, estrogen receptor protein (moderate to strong staining, 60%), and progesterone receptor protein (mild to moderate staining, 30%) with 0 negative staining for HER-2/neu. ??Immunohistochemical stain for P 40 as would be expected in an muco-epidermoid  carcinoma is negative.         Patient Active Problem List   Diagnosis   ??? Malignant neoplasm of left breast in female, estrogen receptor positive (CMS-HCC)   ??? Essential hypertension   ??? Borderline diabetes   ??? Osteopenia of multiple sites   ??? Malignant neoplasm of overlapping sites of lung (CMS-HCC) ??? Dementia without behavioral disturbance (CMS-HCC)   ??? 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 Neoplastic malignant related fatigue   ??? Thyroid dysfunction   ??? Allergic rhinitis   ??? Weight loss       Past Medical History:   Diagnosis Date   ??? Borderline diabetes    ??? Breast cancer (CMS-HCC) 03/2016    left   ??? Forgetfulness    ??? HTN (hypertension)    ??? Hyperlipemia    ??? Lung cancer (CMS-HCC)          Past Surgical History:   Procedure Laterality Date   ??? BREAST BIOPSY Left 03/2016    br ca   ??? HYSTERECTOMY  1979   ??? PR BRNCHSC EBUS GUIDED SAMPL 1/2 NODE STATION/STRUX N/A 03/31/2018    Procedure: Bronch, Rigid Or Flexible, Inc Fluoro Guidance, When Performed; With Ebus Guided Transtracheal And/Or Transbronchial Sampling, One Or Two Mediastinal And/Or Hilar Lymph Node Stations Or Structures;  Surgeon: Sherwood Gambler, MD;  Location: MAIN OR [facility];  Service: Pulmonary   ??? PR BRNCHSC EBUS GUIDED SAMPL 3/> NODE STATION/STRUX N/A 06/30/2016    Procedure: Bronch, Rigid Or Flexible, Including Fluoro Guidance, When Performed; W Ebus Guided Transtracheal And/Or Transbronchial Sampling, 3 Or More Mediastinal And/Or Hilar Lymph Node Stations Or Structures;  Surgeon: Sherwood Gambler, MD;  Location: MAIN OR [facility];  Service: Pulmonary   ??? PR BRONCHOSCOPY,BIOPSY N/A 03/31/2018    Procedure: Bronchoscopy, Rigid/Flexible, Include Fluoro Guide When Performed; W/Bronch/Endobronch Bx, Single/Mult Site;  Surgeon: Sherwood Gambler, MD;  Location: MAIN OR [facility];  Service: Pulmonary   ??? PR MASTECTOMY, RADICAL, URBAN TYPE Left 04/26/2017    Procedure: Mastectomy, Radical, Including Pectoral Muscles, Axillary And Internal Mammary Lymph Nodes (Urban Type Op);  Surgeon: Aris Everts, MD;  [facility];  Service: Surgical Oncology Breast   ??? RADIATION Left     x 1 month       Gyn History:  G4P4. No h/o HRT. Had OCP for 30 years.  Attained menopause at 78 years old with partial hysterectomy 2/2 adheshions. Ovaries left in.     Medications:  Current Outpatient Medications   Medication Sig Dispense Refill   ??? amLODIPine (NORVASC) 5 MG tablet Take 5 mg by mouth daily.     ??? benzonatate (TESSALON PERLES) 100 MG capsule Take 1 capsule (100 mg total) by mouth Three (3) times a day as needed for cough. (Patient not taking: Reported on 03/28/2018) 30 capsule 1   ??? ezetimibe (ZETIA) 10 mg tablet Take 10 mg by mouth daily.     ??? fluticasone propionate (FLONASE) 50 mcg/actuation nasal spray 1 spray by Each Nare route daily. (Patient not taking: Reported on 03/28/2018) 16 g 0   ??? ibuprofen (ADVIL,MOTRIN) 200 MG tablet Take 200 mg by mouth daily as needed for pain.     ??? letrozole (FEMARA) 2.5 mg tablet Take 1 tablet (2.5 mg total) by mouth daily. 90 tablet 3   ??? palbociclib 75 mg Tab Take 75 mg by mouth daily. For 21 days then 7 days off 21 tablet 3  No current facility-administered medications for this visit.        Allergies:  No Known Allergies    Family History:  Cancer-related family history includes Cancer in her daughter. There is no history of Breast cancer or Ovarian cancer.  1 living brother. Twin sister died after birth.  Another brother died in 56 Of heart disease. No h/o breast cancer.  Daughter died at age 34 with GIST.     Social History:   Social History     Social History Narrative    She lives with her daughter Linton Rump, son-in-law Michele Mcalpine, and her grandson in [place], [place] (lived independently until Summer 2018). 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 She is divorced. She has four children - one deceased (metastatic GIST 06-19-2015), three in [place] ([place], [place] in [place], and [place] in [place]). Denies smoking, EtOH. She requires assistance in all iADLs but is independent in ADLs.        Physical Examination:  No physical exam performed today d/t nature of video visit.  Please note exam from last in-person visit as below:  Vital Signs:There were no vitals taken for this visit.  General: Frail-appearing patient in no acute distress.  HEENT: PERRTLA, EOMI, OP clear  Cardiovascular: Normal S1 and S2. RRR. No audible murmurs.  Respiratory: Chest clear to percussion and auscultation.   Gastrointestinal: Abdomen soft without masses and tenderness, no hepatosplenomegaly or other masses.   Musculoskeletal: No bony pain or tenderness over spine or hips.  Skin: No rash, ecchymoses or purpuric lesions noted.  Breasts: Right: Normal. Left breast: Status post mastectomy w/ well-healed incision extending into left axilla, resolving hyperpigmentation 2/2 radiation. Mild tenderness on left chest wall just below clavicle, no palpable masses or skin changes.  Neurologic: Alert and oriented. Grossly non-focal  Lymphatic: No cervical, axillary or supraclavicular adenopathy.   Extremity: No lower extremity edema or upper extremity lymphedema      DATA REVIEW:    Laboratory:     Lab Results   Component Value Date    WBC 6.1 04/10/2018    HGB 10.6 (L) 04/10/2018    HCT 32.7 (L) 04/10/2018    PLT 323 04/10/2018       Lab Results   Component Value Date    NA 137 04/10/2018    K 3.9 04/10/2018    CL 100 04/10/2018    CO2 27.0 04/10/2018    BUN 23 (H) 04/10/2018    CREATININE 0.76 04/10/2018    GLU 104 04/10/2018    CALCIUM 9.5 04/10/2018       Lab Results   Component Value Date    BILITOT 0.3 04/10/2018    PROT 7.6 04/10/2018    ALBUMIN 3.9 04/10/2018    ALT 11 04/10/2018    AST 20 04/10/2018    ALKPHOS 84 04/10/2018

## 2018-08-17 ENCOUNTER — Institutional Professional Consult (permissible substitution)
Admit: 2018-08-17 | Discharge: 2018-08-18 | Payer: MEDICARE | Attending: Geriatric Medicine | Primary: Geriatric Medicine

## 2018-08-17 DIAGNOSIS — R05 Cough: Secondary | ICD-10-CM

## 2018-08-17 DIAGNOSIS — C3402 Malignant neoplasm of left main bronchus: Secondary | ICD-10-CM

## 2018-08-17 DIAGNOSIS — Z170 Estrogen receptor positive status [ER+]: Secondary | ICD-10-CM

## 2018-08-17 DIAGNOSIS — Z5111 Encounter for antineoplastic chemotherapy: Secondary | ICD-10-CM

## 2018-08-17 DIAGNOSIS — R630 Anorexia: Secondary | ICD-10-CM

## 2018-08-17 DIAGNOSIS — R413 Other amnesia: Secondary | ICD-10-CM

## 2018-08-17 DIAGNOSIS — C50812 Malignant neoplasm of overlapping sites of left female breast: Principal | ICD-10-CM

## 2018-08-17 DIAGNOSIS — R634 Abnormal weight loss: Secondary | ICD-10-CM

## 2018-08-17 DIAGNOSIS — R5383 Other fatigue: Secondary | ICD-10-CM

## 2018-08-17 NOTE — Unmapped (Signed)
RETURN TELEPHONE VISIT IN LIEU OF FACE TO FACE VISIT DUE TO STATE OF EMERGENCY FROM THE CORONAVIRUS OUTBREAK.    The patient verbally consent to the telephone encounter. I spent 18 minutes on the phone with the patient. I spent an additional 15 minutes on pre- and post-visit activities.     The patient was physically located in [place] or a state in which I am permitted to provide care. The patient and/or parent/gauardian understood that s/he may incur co-pays and cost sharing, and agreed to the telemedicine visit. The visit was completed via phone and/or video, which was appropriate and reasonable under the circumstances given the patient's presentation at the time.    The patient and/or parent/guardian has been advised of the potential risks and limitations of this mode of treatment (including, but not limited to, the absence of in-person examination) and has agreed to be treated using telemedicine. The patient's/patient's family's questions regarding telemedicine have been answered.     If the phone/video visit was completed in an ambulatory setting, the patient and/or parent/guardian has also been advised to contact their provider???s office for worsening conditions, and seek emergency medical treatment and/or call 911 if the patient deems either necessary.       Visit Conducted via Telephone  Person Contacted: International aid/development worker Phone number: [phone] (home)   Phone Outcome: Contacted Patient/Caregiver  Is there someone else in the room? Yes. What is your relationship? Daughter Amy. Do you want this person here for the visit? Yes.    Referring Physician: Murrell Redden, MD    PCP: Corwin Levins, MD    Consulting Physicians: Surgical oncology: Lucretia Roers M.D.    First line: Ibrance + letrozole  Subsequent lines: consider PACE study (with Faslodex)     Genomic: ESR1, TP53. MSS, TMB, PDL1 & SIS Low  Harmony: Luminal B -----------------------------------------------------------------------------------------------------  Assessment:  I spent at least 18  minutes with this patient more than 50% in counseling. The following issues were discussed: Katherine Sherman is a 78 y.o. female never smoker with mild cognitive impairment/dementia with 2 synchronous malignancies : At least stage IIB pT3, pN1b M0 IDC, grade 2, ER 95%, PR 70%, HER negative by FISH, ki67 12%, 6.9 cm mass in the left breast involving the pectoralis,  AND at least Stage IIB cT1c,pN1 mucoepidermoid NSCLC with 2.6 cm left hilar mass and multiple indeterminate LLL nodules and multiple indeterminate liver hypodensities up to 1 cm. NSCLC confirmed on bx of left hilar LN (11L).  She was on primary endocrine therapy (letrozole and tamoxifen) from 03/2016 until 03/2017, underwent left mastectomy on 04/26/2017 followed by postmastectomy radiation completed 07/2017 , and IV CMF x 8 cycles completed 01/16/2018.  She was then started on exemestane.    She underwent her annual CT chest for lung cancer surveillance and unfortunately was found to have several new lung masses in both lobes.  Largest was left hilar paramediastinal mass measuring 5 x 3 cm.  She underwent bronchoscopy for biopsy of the left hilar mass on 03/31/2018, biopsy was positive for metastatic adenocarcinoma morphologically similar to breast cancer with ER 60% +, PR 30% + and HER-2 negative.      We previously discussed the natural history of stage IV breast cancer and the standard approach to treating stage IV HR+ breast cancer. She was advised that while her disease is incurable, it is very treatable and many women live years on treatment. Our goals of therapy are to control your disease for  as long as possible while minimizing side effects.  She was started on Ibrance + letrozole as her recurrence occurred on exemestane. We plan to hold Faslodex for second line treatment (consider the PACE clinical trial).  She declined enrolment on the [id number] clinical trial for 70+ adults which would include the standard of care letrozole + Ibrance.      By report the patient seems to be doing fair with no evidence of disease progression by history.  She seems to be tolerating letrozole Ibrance reasonably well.  Unfortunately her daughter and relatives were not able to bring her in for imaging.  They are all reluctant to come to the hospital during the coronavirus outbreak.  Together we opted to obtain tumor markers to get a sense of her disease activity.  If her tumor markers are markedly elevated and/or rising we agreed that she will need to come to the hospital sooner for imaging.  If they are low, stable or declining we can delay imaging for a month or 2.  Otherwise I encouraged her to continue therapy.  We will coordinate repeat labs and follow-up with Aimee Faso in 1 month.  I will have her seen by Carlos American in 2 months and I will see her back in 3 months hopefully with imaging at that time.  They have contact information will call in interval with any questions or concerns.    PLAN:    Metastatic Breast Cancer to lung/hilar nodes (ER 60%, PR 30%, and HER2 -)  -- Treatment: first line letrozole + palbociclib 100 mg (d/t age).   -- Genomics: STARTA and HARMONY as above.  -- Staging: CT chest 03/10/18, needs CT a/p + Bone scan scheduled ASAP  -- Bone Support: Plan to start on monthly Xgeva when possible.    Left Breast Cancer: Stage IB pT3, pN1b IDC, grade 2, ER 95%, PR 70%, HER negative by FISH  -- Neoadjuvant endocrine therapy: 03/2016 letrozole, clinical progression 02/2017. Started on Tamoxifen 03/04/17 with progression in 1 month.   -- Surgery: 04/26/17 Mastectomy and SLNE: 7.9 cm invasive ductal carcinoma with 2+ lymph nodes the largest deposit measuring 1.5 cm with extracapsular extension.   -- Chemotherapy: first generation CMF q21d x 8 cycles completed 01/12/18.   -- Radiation: Post-mastectomy radiation completed on 07/28/17.    Stage IIB cT1cN1- mucoepidermoid NSCLC 2.6 cm left hilar mass, with + left hilar Ln (N1) and negative N2, N3 LNs.   -- Completed tentative radiation to the left hilar mass between 08/10/16 and 08/24/16   -- Attention on rpt imaging.    Supportive care  --Cough: persistent for > 6 months.  Perhaps worse.  Attention on follow-up imaging.  --Loss of appetite/weight loss: Enjoys concentrated sweets.  Encouraged to liberalize diet a bit.  Encouraged to monitor weight.  --Pain: denies any pain today  --Chest pain: on the left chest, just above her mastectomy incision. Denies pain today.  --Memory loss: Worsening dementia versus medication side effects.    Follow-up  --Aimee Faso in 1 month.  Corrine Marshall in 2 months.  Me in 3 months.  ------------------------------------------------------------------------------------------------------------------------------------  Chief Complaint   Patient presents with   ??? Pre-treatment Follow-up   ??? Breast Cancer       History of the Present Illness: Katherine Sherman is a pleasant 78 y.o. female with history of HTN, HDL and at least mild cognitive impairment who was recently diagnosed with at least Stage III breast cancer and started on neoadjuvant endocrine therapy with  letrozole. She was started on letrozole by her local oncologist since  04/27/2016. She was not a candidate for ALTERNATE trial since she had already been started on endocrine therapy. We performed staging bone and CT scans which showed a 2.6 cm left hilar mass, multiple small left lower lobe lung nodules up to 1 cm, and multiple hypodensities in the liver that are indeterminate up to 1.cm. Bone scan was negative. MRI brain ruled out brain mets. Imaging discussed at [facility] and IP performed EBUS with mediastinal/ hilar LN biopsy and an 11L (left hilar) LN was  positive for NSCLC - mucoepidermoid carcinoma. At least Stage IIB cT1c,N1 NSCLC. Given the cognitive impairment and mildly limited functional status she was felt to not be a got surgical candidate and therefore definitive radiation therapy was recommended. It remains unclear what the other LLL pulmonary nodules and the indeterminate hepatic hypodensities represent (lung cancer, breast cancer or benign).    Initially she was started on primary endocrine therapy with letrozole.  Consideration for neoadjuvant TC however given low rates of PCR and cognitive impairment making it difficult to tolerate chemo elected to continue with primary endocrine therapy.  Initially had response, in 12/2016 developed a new left axillary lymph node, on exam in December 2018 found to have a 1 cm of erosion through the skin. She underwent left mastectomy and sentinel lymph node evaluation on 04/26/17. Final pathology showed a 7.9 cm invasive ductal carcinoma with 2/10 + lymph nodes the largest deposit measuring 1.5 cm with extracapsular extension. Restaging CT Chest/Abd 06/09/17 generally stable compared to prior with no definitive evidence of metastatic disease. Given the concern for local control with rapid progression on primary endocrine therapy, elected to undergo radiation to the chest wall and nodal regions initially in the adjuvant setting.  She completed radiation on 07/29/2017. Started on CMF completed 12/2017. On 03/10/18 CT chest with new lung massess and hilar mass (5 x 3 cm), bx confirmed metastatic breast cancer ER 60%, PR 30% and HER2-.     Interval history: I spoke with the patient and her daughter while they were at their home in [place].  The patient has dementia and has limited ability to contribute to the medical history.  Most of the history is obtained from the patient's daughter Amy who she has been living with.  Unfortunately Amy has atrial fibrillation, her brother has diabetes and their other sister is reluctant to take her to the hospital for imaging.  Today is day 23 of Ibrance therapy.  She had labs recently at [facility] but I am unable to see the results.  The labs were done early as there was supposed to be done on day 1 which is 5/25???26.  The patient reports feeling well overall.  Amy reports no breast or chest wall related complaints.  She noticed coughing fits which have been present for sometime but are perhaps worse.  She also reports that her mom's memory issues seem to be worse.  For example she has to be reminded several times out to use the coffee maker, could not figure out what she wants to eat etc.  Amy was consider putting her in an assisted living facility as it has been hard to care for her but decided not to during the coronavirus outbreak.  Otherwise she reports that her appetite is intermittently poor and that the patient likes to eat concentrated sweets.  Amy says she appears smaller and believes that she may have lost weight.  The  patient continues to spend the majority of her days sitting on the recliner watching television.    Review of Systems: A complete twelve systems review was obtained and is positive per the HPI but otherwise negative in detail. See MIMS #1170 where available.    Functional Status:  ECOG PS = 1 independent ADLs.  Requires assistance with IADLs.    Oncology History    STRATA  ESR1 p.[id number]  [id number]  Estimated variant allele frequency:  25%  TP53 p.V272L  [id number]:c.[id number]>C  Estimated variant allele frequency:  43%  MSS  Microsatellite Stable  TMB - Low  Mutations per MB: 3  Confidence interval: 1 - 8  PD-L1 - Low  RNA expression score: 14  The estimated tumor content was  less than 50%, potentially increasing  representation of PD-L1 expression  from non-tumor cells  Strata Immune Signature - Low    HARMONY  Luminal B        Malignant neoplasm of left breast in female, estrogen receptor positive (CMS-HCC)    03/2016 -  Presenting Symptoms     6 months of noticing left breast mass. Eventually got MMG which showed a 6 cm Left beast mass.       04/09/2016 Biopsy     IDC with DCIS, ER +, PR+., HER2 neg, Ki67 12%.       04/09/2016 Initial Diagnosis     Malignant neoplasm of left breast in female, estrogen receptor positive (RAF-HCC).       04/22/2016 -  Cancer Staged     MRI breast b/l:  inner lower quadrant left breast  measuring 4.8 x 4.6 x 6.5 cm with evidence of pectoralis muscle invasion, but no involvement of underlying chest wall. Abnormal left internal mammary lymph node measuring 9 x 16 mm. (cN2b ipsilat int mammary without axillary LN). No suspicious axillary adenopathy.    [place] read of this MRI: 2 left internal mammary nodes 1.6 and 0.6 cm, Left level 1 axillary node 1.3 cm     This makes it cN3b (ispilateral internal mammary and axillary LN)    However [place] read of targeted left axillary US showed no abnormal LN.     Stage IIIA vs IIIC [T3N2b vs N3c]          04/27/2016 -  Other     Oncotype DX testing sent by primary oncologist, Dr Pamelia Hoit at [facility]  to guide decision for neoadjuvant chemotherapy vs endocrine therapy. RS = 19.       04/27/2016 Endocrine/Hormone Therapy     Started on Neoadjuvant Endocrine therapy with Letrazole       06/17/2016 -  Cancer Staged     CT C/A/P:   2.6 cm left hilar mass, several LLL nodules up to 1.0 cm and a 2.5 cm LLL nodule. Several liver hypodensities up to 1.0 cm - indeterminate.  Known left breast mass 5.5 cm involving the pectoralis muscle.     Bone scan negative. Brain MRI negative.         Malignant neoplasm of overlapping sites of lung (CMS-HCC)    06/17/2016 -  Cancer Staged     CT C/A/P: 2.6 cm left hilar mass, several LLL nodules up to 1.0 cm and a 2.5 cm LLL nodule. Several liver hypodensities up to 1.0 cm - indeterminate.  Known left breast mass.     MRI brain - negative        06/30/2016 Biopsy     EBUS: N2, N3 Ln negative .  11L LN + for mucoepidermoid NSCLC.       07/06/2016 Initial Diagnosis     Malignant neoplasm of overlapping sites of lung (CMS-HCC). At least Stage IIB [T1c,N1] mucoepidermoid NSCLC      07/2016 -  Radiation     Definitive XRT to lung 03/31/2018 Biopsy     Fine-needle aspiration cytology and cell block of left hilar mass:  ??? Metastatic adenocarcinoma    The previous lung carcinoma ([id number]) demonstrating a mucoepidermoid of the lung is reviewed as well as the previous ductal carcinoma of the breast ([id number]???[id number]) and the current malignancy is morphologically similar to the breast carcinoma. ??Panel of immunohistochemical stains also supports that the metastatic adenocarcinoma is of breast origin based on positive staining of the malignant cells in the cell block for Gata-3, estrogen receptor protein (moderate to strong staining, 60%), and progesterone receptor protein (mild to moderate staining, 30%) with 0 negative staining for HER-2/neu. ??Immunohistochemical stain for P 40 as would be expected in an muco-epidermoid carcinoma is negative.         Patient Active Problem List   Diagnosis   ??? Malignant neoplasm of left breast in female, estrogen receptor positive (CMS-HCC)   ??? Essential hypertension   ??? Borderline diabetes   ??? Osteopenia of multiple sites   ??? Malignant neoplasm of overlapping sites of lung (CMS-HCC)   ??? Dementia without behavioral disturbance (CMS-HCC)   ??? 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 Neoplastic malignant related fatigue   ??? Thyroid dysfunction   ??? Allergic rhinitis   ??? Weight loss       Past Medical History:   Diagnosis Date   ??? Borderline diabetes    ??? Breast cancer (CMS-HCC) 03/2016    left   ??? Forgetfulness    ??? HTN (hypertension)    ??? Hyperlipemia    ??? Lung cancer (CMS-HCC)          Past Surgical History:   Procedure Laterality Date   ??? BREAST BIOPSY Left 03/2016    br ca   ??? HYSTERECTOMY  1979   ??? PR BRNCHSC EBUS GUIDED SAMPL 1/2 NODE STATION/STRUX N/A 03/31/2018    Procedure: Bronch, Rigid Or Flexible, Inc Fluoro Guidance, When Performed; With Ebus Guided Transtracheal And/Or Transbronchial Sampling, One Or Two Mediastinal And/Or Hilar Lymph Node Stations Or Structures;  Surgeon: Sherwood Gambler, MD;  Location: MAIN OR [facility];  Service: Pulmonary   ??? PR BRNCHSC EBUS GUIDED SAMPL 3/> NODE STATION/STRUX N/A 06/30/2016    Procedure: Bronch, Rigid Or Flexible, Including Fluoro Guidance, When Performed; W Ebus Guided Transtracheal And/Or Transbronchial Sampling, 3 Or More Mediastinal And/Or Hilar Lymph Node Stations Or Structures;  Surgeon: Sherwood Gambler, MD;  Location: MAIN OR [facility];  Service: Pulmonary   ??? PR BRONCHOSCOPY,BIOPSY N/A 03/31/2018    Procedure: Bronchoscopy, Rigid/Flexible, Include Fluoro Guide When Performed; W/Bronch/Endobronch Bx, Single/Mult Site;  Surgeon: Sherwood Gambler, MD;  Location: MAIN OR [facility];  Service: Pulmonary   ??? PR MASTECTOMY, RADICAL, URBAN TYPE Left 04/26/2017    Procedure: Mastectomy, Radical, Including Pectoral Muscles, Axillary And Internal Mammary Lymph Nodes (Urban Type Op);  Surgeon: Aris Everts, MD;  Location: MAIN OR [facility];  Service: Surgical Oncology Breast   ??? RADIATION Left     x 1 month       Gyn History:  G4P4. No h/o HRT. Had OCP for 30 years.  Attained menopause at 78 years old with partial hysterectomy 2/2 adheshions. Ovaries left  in.     Medications:  Current Outpatient Medications   Medication Sig Dispense Refill   ??? amLODIPine (NORVASC) 5 MG tablet Take 5 mg by mouth daily.     ??? benzonatate (TESSALON PERLES) 100 MG capsule Take 1 capsule (100 mg total) by mouth Three (3) times a day as needed for cough. (Patient not taking: Reported on 03/28/2018) 30 capsule 1   ??? ezetimibe (ZETIA) 10 mg tablet Take 10 mg by mouth daily.     ??? fluticasone propionate (FLONASE) 50 mcg/actuation nasal spray 1 spray by Each Nare route daily. (Patient not taking: Reported on 03/28/2018) 16 g 0   ??? ibuprofen (ADVIL,MOTRIN) 200 MG tablet Take 200 mg by mouth daily as needed for pain.     ??? letrozole (FEMARA) 2.5 mg tablet Take 1 tablet (2.5 mg total) by mouth daily. 90 tablet 3   ??? palbociclib 75 mg Tab Take 75 mg by mouth daily. For 21 days then 7 days off 21 tablet 3     No current facility-administered medications for this visit.        Allergies:  No Known Allergies    Family History:  Cancer-related family history includes Cancer in her daughter. There is no history of Breast cancer, Colon cancer, or Ovarian cancer.  1 living brother. Twin sister died after birth.  Another brother died in 58 Of heart disease. No h/o breast cancer.  Daughter died at age 59 with GIST.     Social History:   Social History     Social History Narrative    She lives with her daughter Linton Rump, son-in-law Michele Mcalpine, and her grandson in [place], [place] (lived independently until Summer 2018). 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 She is divorced. She has four children - one deceased (metastatic GIST 07-18-2015), three in [place] (Amy, Staley in [place], and Laona in [place]). Denies smoking, EtOH. She requires assistance in all iADLs but is independent in ADLs.        Physical Examination: Telephone encounter: No physical examination performed.  Vital Signs:There were no vitals taken for this visit.  General: Frail-appearing patient in no acute distress.  HEENT: PERRTLA, EOMI, OP clear  Cardiovascular: Normal S1 and S2. RRR. No audible murmurs.  Respiratory: Chest clear to percussion and auscultation.   Gastrointestinal: Abdomen soft without masses and tenderness, no hepatosplenomegaly or other masses.   Musculoskeletal: No bony pain or tenderness over spine or hips.  Skin: No rash, ecchymoses or purpuric lesions noted.  Breasts: Right: Normal. Left breast: Status post mastectomy w/ well-healed incision extending into left axilla, resolving hyperpigmentation 2/2 radiation. Mild tenderness on left chest wall just below clavicle, no palpable masses or skin changes.  Neurologic: Alert and oriented. Grossly non-focal  Lymphatic: No cervical, axillary or supraclavicular adenopathy.   Extremity: No lower extremity edema or upper extremity lymphedema      DATA REVIEW:    Laboratory:     Lab Results   Component Value Date    WBC 6.1 04/10/2018    HGB 10.6 (L) 04/10/2018    HCT 32.7 (L) 04/10/2018    PLT 323 04/10/2018       Lab Results   Component Value Date    NA 137 04/10/2018    K 3.9 04/10/2018    CL 100 04/10/2018    CO2 27.0 04/10/2018    BUN 23 (H) 04/10/2018    CREATININE 0.76 04/10/2018    GLU 104  04/10/2018    CALCIUM 9.5 04/10/2018       Lab Results   Component Value Date    BILITOT 0.3 04/10/2018    PROT 7.6 04/10/2018    ALBUMIN 3.9 04/10/2018    ALT 11 04/10/2018    AST 20 04/10/2018    ALKPHOS 84 04/10/2018

## 2018-08-18 NOTE — Unmapped (Signed)
It was a pleasure to see you today in the Medical Oncology Clinic.  Please call our nurse navigator, Dan Maker, if you have any interval questions or concerns:     For appointments, please call [phone]     Nurse Navigator:   Modena Jansky, RN: phone: [phone]     Prescription refills: [phone]     FAX: [phone]     For emergencies, evenings or weekends, please call [phone] and ask for the oncology fellow on call.     Reasons to call emergency line may include:   Fever of 100.5 or greater   Nausea and/or vomiting not relieved with nausea medicine   Diarrhea or constipation not relieved with bowel regimen   Severe pain not relieved with usual pain regimen     Labs from today:  Lab Results   Component Value Date    WBC 6.1 04/10/2018    HGB 10.6 (L) 04/10/2018    HCT 32.7 (L) 04/10/2018    MCV 89.8 04/10/2018    PLT 323 04/10/2018       Lab Results   Component Value Date    NEUTROABS 4.1 04/10/2018         Chemistry        Component Value Date/Time    NA 137 04/10/2018 1220    K 3.9 04/10/2018 1220    CL 100 04/10/2018 1220    CO2 27.0 04/10/2018 1220    BUN 23 (H) 04/10/2018 1220    CREATININE 0.76 04/10/2018 1220    CREATININE 1.0 06/09/2017 1108    GLU 104 04/10/2018 1220        Component Value Date/Time    CALCIUM 9.5 04/10/2018 1220    ALKPHOS 84 04/10/2018 1220    AST 20 04/10/2018 1220    ALT 11 04/10/2018 1220    BILITOT 0.3 04/10/2018 1220            Lab Results   Component Value Date    ALT 11 04/10/2018    AST 20 04/10/2018    ALKPHOS 84 04/10/2018    BILITOT 0.3 04/10/2018       No future appointments.

## 2018-08-24 NOTE — Unmapped (Signed)
Left message about all phone visits/times that are scheduled through August

## 2018-09-12 NOTE — Unmapped (Signed)
Hey Em!  6/19  Lab/scans are scheduled.  The times that are scheduled is what they had (I left the daughter a message)  Also 6.22 video is scheduled at 1030 w jolly

## 2018-09-12 NOTE — Unmapped (Signed)
Left message about 6.19 and 6.22 appts. Left info about time/date/location

## 2018-09-13 NOTE — Unmapped (Signed)
Left message about switching time to 1130a instead of 1030a on 6.22

## 2018-09-15 ENCOUNTER — Ambulatory Visit: Admit: 2018-09-15 | Discharge: 2018-09-26 | Payer: MEDICARE

## 2018-09-15 ENCOUNTER — Other Ambulatory Visit: Admit: 2018-09-15 | Discharge: 2018-09-26 | Payer: MEDICARE

## 2018-09-15 DIAGNOSIS — C50919 Malignant neoplasm of unspecified site of unspecified female breast: Principal | ICD-10-CM

## 2018-09-15 DIAGNOSIS — C3482 Malignant neoplasm of overlapping sites of left bronchus and lung: Principal | ICD-10-CM

## 2018-09-15 DIAGNOSIS — C50812 Malignant neoplasm of overlapping sites of left female breast: Principal | ICD-10-CM

## 2018-09-15 DIAGNOSIS — C3402 Malignant neoplasm of left main bronchus: Secondary | ICD-10-CM

## 2018-09-15 DIAGNOSIS — Z170 Estrogen receptor positive status [ER+]: Secondary | ICD-10-CM

## 2018-09-15 LAB — CBC W/ AUTO DIFF
BASOPHILS ABSOLUTE COUNT: 0 10*9/L (ref 0.0–0.1)
BASOPHILS RELATIVE PERCENT: 1.3 %
EOSINOPHILS ABSOLUTE COUNT: 0.1 10*9/L (ref 0.0–0.4)
EOSINOPHILS RELATIVE PERCENT: 2.4 %
HEMATOCRIT: 26.7 % — ABNORMAL LOW (ref 36.0–46.0)
HEMOGLOBIN: 8.8 g/dL — ABNORMAL LOW (ref 12.0–16.0)
LARGE UNSTAINED CELLS: 5 % — ABNORMAL HIGH (ref 0–4)
LYMPHOCYTES ABSOLUTE COUNT: 1.1 10*9/L — ABNORMAL LOW (ref 1.5–5.0)
LYMPHOCYTES RELATIVE PERCENT: 32.3 %
MEAN CORPUSCULAR HEMOGLOBIN: 32.1 pg (ref 26.0–34.0)
MEAN CORPUSCULAR VOLUME: 97.7 fL (ref 80.0–100.0)
MEAN PLATELET VOLUME: 8.7 fL (ref 7.0–10.0)
MONOCYTES ABSOLUTE COUNT: 0.5 10*9/L (ref 0.2–0.8)
MONOCYTES RELATIVE PERCENT: 14.8 %
NEUTROPHILS RELATIVE PERCENT: 44.2 %
PLATELET COUNT: 227 10*9/L (ref 150–440)
RED BLOOD CELL COUNT: 2.73 10*12/L — ABNORMAL LOW (ref 4.00–5.20)
RED CELL DISTRIBUTION WIDTH: 18.2 % — ABNORMAL HIGH (ref 12.0–15.0)
WBC ADJUSTED: 3.5 10*9/L — ABNORMAL LOW (ref 4.5–11.0)

## 2018-09-15 LAB — COMPREHENSIVE METABOLIC PANEL
ALBUMIN: 3.5 g/dL (ref 3.5–5.0)
ALKALINE PHOSPHATASE: 82 U/L (ref 38–126)
ALT (SGPT): 8 U/L (ref <35)
ANION GAP: 9 mmol/L (ref 7–15)
AST (SGOT): 20 U/L (ref 14–38)
BILIRUBIN TOTAL: 0.4 mg/dL (ref 0.0–1.2)
BLOOD UREA NITROGEN: 20 mg/dL (ref 7–21)
BUN / CREAT RATIO: 18
CALCIUM: 9 mg/dL (ref 8.5–10.2)
CHLORIDE: 100 mmol/L (ref 98–107)
CO2: 26 mmol/L (ref 22.0–30.0)
CREATININE: 1.09 mg/dL — ABNORMAL HIGH (ref 0.60–1.00)
EGFR CKD-EPI AA FEMALE: 56 mL/min/{1.73_m2} — ABNORMAL LOW (ref >=60)
EGFR CKD-EPI NON-AA FEMALE: 49 mL/min/{1.73_m2} — ABNORMAL LOW (ref >=60)
GLUCOSE RANDOM: 105 mg/dL (ref 70–179)
POTASSIUM: 3.8 mmol/L (ref 3.5–5.0)
SODIUM: 135 mmol/L (ref 135–145)

## 2018-09-15 LAB — HEMOGLOBIN: Hemoglobin:MCnc:Pt:Bld:Qn:: 8.8 — ABNORMAL LOW

## 2018-09-15 LAB — CARCINOEMBRYONIC ANTIGEN: Carcinoembryonic Ag:MCnc:Pt:Ser/Plas:Qn:: 732 — ABNORMAL HIGH

## 2018-09-15 LAB — ALKALINE PHOSPHATASE: Alkaline phosphatase:CCnc:Pt:Ser/Plas:Qn:: 82

## 2018-09-15 LAB — SMEAR REVIEW

## 2018-09-15 NOTE — Unmapped (Signed)
PIV inserted prior to CT.   Labs drawn with no complications by Yisroel Ramming RN  .  PIV flushed with saline.

## 2018-09-18 ENCOUNTER — Telehealth
Admit: 2018-09-18 | Discharge: 2018-09-20 | Payer: MEDICARE | Attending: Geriatric Medicine | Primary: Geriatric Medicine

## 2018-09-18 ENCOUNTER — Ambulatory Visit: Admit: 2018-09-18 | Discharge: 2018-09-20 | Payer: MEDICARE

## 2018-09-18 ENCOUNTER — Ambulatory Visit: Admit: 2018-09-18 | Discharge: 2018-09-20 | Payer: MEDICARE | Attending: Pharmacist | Primary: Pharmacist

## 2018-09-18 DIAGNOSIS — C3482 Malignant neoplasm of overlapping sites of left bronchus and lung: Secondary | ICD-10-CM

## 2018-09-18 DIAGNOSIS — R634 Abnormal weight loss: Secondary | ICD-10-CM

## 2018-09-18 DIAGNOSIS — N3000 Acute cystitis without hematuria: Secondary | ICD-10-CM

## 2018-09-18 DIAGNOSIS — C50812 Malignant neoplasm of overlapping sites of left female breast: Principal | ICD-10-CM

## 2018-09-18 DIAGNOSIS — N3090 Cystitis, unspecified without hematuria: Secondary | ICD-10-CM

## 2018-09-18 DIAGNOSIS — R627 Adult failure to thrive: Secondary | ICD-10-CM

## 2018-09-18 DIAGNOSIS — Z170 Estrogen receptor positive status [ER+]: Secondary | ICD-10-CM

## 2018-09-18 LAB — COMPREHENSIVE METABOLIC PANEL
ALBUMIN: 3.4 g/dL — ABNORMAL LOW (ref 3.5–5.0)
ALT (SGPT): 9 U/L (ref <35)
ANION GAP: 12 mmol/L (ref 7–15)
AST (SGOT): 19 U/L (ref 14–38)
BILIRUBIN TOTAL: 0.2 mg/dL (ref 0.0–1.2)
BLOOD UREA NITROGEN: 16 mg/dL (ref 7–21)
BUN / CREAT RATIO: 18
CALCIUM: 9.4 mg/dL (ref 8.5–10.2)
CHLORIDE: 102 mmol/L (ref 98–107)
CO2: 27 mmol/L (ref 22.0–30.0)
CREATININE: 0.91 mg/dL (ref 0.60–1.00)
EGFR CKD-EPI AA FEMALE: 70 mL/min/{1.73_m2} (ref >=60)
EGFR CKD-EPI NON-AA FEMALE: 61 mL/min/{1.73_m2} (ref >=60)
GLUCOSE RANDOM: 134 mg/dL (ref 70–179)
PROTEIN TOTAL: 7 g/dL (ref 6.5–8.3)
SODIUM: 141 mmol/L (ref 135–145)

## 2018-09-18 LAB — MAGNESIUM: Magnesium:MCnc:Pt:Ser/Plas:Qn:: 2.2

## 2018-09-18 LAB — URINALYSIS
BILIRUBIN UA: NEGATIVE
GLUCOSE UA: NEGATIVE
KETONES UA: NEGATIVE
NITRITE UA: POSITIVE — AB
PH UA: 5 (ref 5.0–9.0)
PROTEIN UA: 30 — AB
RBC UA: 12 /HPF — ABNORMAL HIGH (ref <=4)
SPECIFIC GRAVITY UA: 1.013 (ref 1.003–1.030)
SQUAMOUS EPITHELIAL: <1 /HPF (ref 0–5)
TRANSITIONAL EPITHELIAL: <1 /HPF (ref 0–2)
UROBILINOGEN UA: 0.2
WBC UA: >182 /HPF — ABNORMAL HIGH (ref 0–5)

## 2018-09-18 LAB — CBC W/ AUTO DIFF
BASOPHILS ABSOLUTE COUNT: 0.1 10*9/L (ref 0.0–0.1)
BASOPHILS RELATIVE PERCENT: 1.5 %
EOSINOPHILS ABSOLUTE COUNT: 0.1 10*9/L (ref 0.0–0.4)
EOSINOPHILS RELATIVE PERCENT: 2.1 %
HEMATOCRIT: 27.9 % — ABNORMAL LOW (ref 36.0–46.0)
HEMOGLOBIN: 9 g/dL — ABNORMAL LOW (ref 12.0–16.0)
LYMPHOCYTES ABSOLUTE COUNT: 0.8 10*9/L — ABNORMAL LOW (ref 1.5–5.0)
MEAN CORPUSCULAR HEMOGLOBIN CONC: 32.4 g/dL (ref 31.0–37.0)
MEAN CORPUSCULAR HEMOGLOBIN: 31.6 pg (ref 26.0–34.0)
MEAN CORPUSCULAR VOLUME: 97.8 fL (ref 80.0–100.0)
MEAN PLATELET VOLUME: 9.2 fL (ref 7.0–10.0)
MONOCYTES ABSOLUTE COUNT: 0.5 10*9/L (ref 0.2–0.8)
MONOCYTES RELATIVE PERCENT: 15.9 %
NEUTROPHILS ABSOLUTE COUNT: 1.7 10*9/L — ABNORMAL LOW (ref 2.0–7.5)
NEUTROPHILS RELATIVE PERCENT: 52.7 %
PLATELET COUNT: 275 10*9/L (ref 150–440)
RED BLOOD CELL COUNT: 2.85 10*12/L — ABNORMAL LOW (ref 4.00–5.20)
RED CELL DISTRIBUTION WIDTH: 17.9 % — ABNORMAL HIGH (ref 12.0–15.0)
WBC ADJUSTED: 3.1 10*9/L — ABNORMAL LOW (ref 4.5–11.0)

## 2018-09-18 LAB — UROBILINOGEN UA: Lab: 0.2

## 2018-09-18 LAB — BLOOD UREA NITROGEN: Urea nitrogen:MCnc:Pt:Ser/Plas:Qn:: 16

## 2018-09-18 LAB — LYMPHOCYTES ABSOLUTE COUNT: Lab: 0.8 — ABNORMAL LOW

## 2018-09-18 LAB — CA 27-29: Lab: 1089.6 — ABNORMAL HIGH

## 2018-09-18 NOTE — Unmapped (Signed)
Hematology/Oncology Brief Admission Communication Note    Katherine Sherman is a 78 y.o. 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  She also has a hx of Stage IIB cT1c,pN1 mucoepidermoid NSCLC s/p definitive radiation.     Unfortunately, re-staging scans showed significant interval progression of disease with worsening pulmonary, pleural, nodal, adrenal, splenic and osseous mets. Tumor markers significantly elevated, including CEA 732, CA 27-29 of 807, and  CA 15-3 of 225.     Daughter reports picture of hypoactive delirium with increased confusion and diminished interaction.  Patient is spending >80% of the day in bed.  We discussed that this may be due to acute reversible cause, such as UTI, hypercalcemia (given significant bone mets), dehydration with AKI (Cr 1.09 on 6/19  from 0.76).  We recommend admission through infusion center for further work up.  If patient clinically improves, then we will consider proceeding to subsequent therapy with fulvestrant.  However, if no clinical improvement, then patient would be appropriate for hospice and we initiated these conversations with daughter, Amy.      Reason for admission: She is being admitted today due to delirium.    Plan for infusion center:   If the patient has an active treatment plan, chemotherapy to be administered: no  Infusions or supportive care ordered to be started in infusion: 1 L NS bolus   Additional testing/orders to be carried out prior to admission: CMP, Mg, CBC with differential, urinalysis +/- urine culture    During admission: consult Rad Onc about palliatie RT to any painful osseous lesions (attn: right iliac wing mass)    The preliminary plan and goals of admission are: to identify any reversible causes of delirium and treat accordingly.  If no identifiable cause and patient does not clinically improve, then it would be reasonable to discuss hospice.      The primary outpatient oncologist for this patient is Dr. Kerrin Champagne     I spent 35 minutes on the audio/video with the patient. I spent an additional 25 minutes on pre- and post-visit activities.     The patient was physically located in [place] or a state in which I am permitted to provide care. The patient and/or parent/guardian understood that s/he may incur co-pays and cost sharing, and agreed to the telemedicine visit. The visit was reasonable and appropriate under the circumstances given the patient's presentation at the time.    The patient and/or parent/guardian has been advised of the potential risks and limitations of this mode of treatment (including, but not limited to, the absence of in-person examination) and has agreed to be treated using telemedicine. The patient's/patient's family's questions regarding telemedicine have been answered.     If the phone/video visit was completed in an ambulatory setting, the patient and/or parent/guardian has also been advised to contact their provider???s office for worsening conditions, and seek emergency medical treatment and/or call 911 if the patient deems either necessary.    The patient was staffed with Dr. Margurite Auerbach, MD  Hematology & Oncology Fellow, PGY4    This was a telehealth service where a resident was involved. I saw and evaluated the patient via real-time audio/video connection, participating in the key portions of the service.  I reviewed the resident's note.  I agree with the resident's findings and plan. Jilda Panda

## 2018-09-18 NOTE — Unmapped (Signed)
Pts daughter states all Katherine Sherman wants to do is sleep.

## 2018-09-18 NOTE — Unmapped (Signed)
It was a pleasure to see you today in the Medical Oncology Clinic.  Please call our nurse navigator, Dan Maker, if you have any interval questions or concerns:     For appointments, please call [phone]     Nurse Navigator:   Modena Jansky, RN: phone: [phone]     Prescription refills: [phone]     FAX: [phone]     For emergencies, evenings or weekends, please call [phone] and ask for the oncology fellow on call.     Reasons to call emergency line may include:   Fever of 100.5 or greater   Nausea and/or vomiting not relieved with nausea medicine   Diarrhea or constipation not relieved with bowel regimen   Severe pain not relieved with usual pain regimen     Labs from today:  Lab Results   Component Value Date    WBC 3.5 (L) 09/15/2018    HGB 8.8 (L) 09/15/2018    HCT 26.7 (L) 09/15/2018    MCV 97.7 09/15/2018    PLT 227 09/15/2018       Lab Results   Component Value Date    NEUTROABS 1.6 (L) 09/15/2018         Chemistry        Component Value Date/Time    NA 135 09/15/2018 1210    K 3.8 09/15/2018 1210    CL 100 09/15/2018 1210    CO2 26.0 09/15/2018 1210    BUN 20 09/15/2018 1210    CREATININE 1.09 (H) 09/15/2018 1210    CREATININE 1.0 06/09/2017 1108    GLU 105 09/15/2018 1210        Component Value Date/Time    CALCIUM 9.0 09/15/2018 1210    ALKPHOS 82 09/15/2018 1210    AST 20 09/15/2018 1210    ALT 8 09/15/2018 1210    BILITOT 0.4 09/15/2018 1210            Lab Results   Component Value Date    ALT 8 09/15/2018    AST 20 09/15/2018    ALKPHOS 82 09/15/2018    BILITOT 0.4 09/15/2018       Future Appointments   Date Time Provider Department Center   10/16/2018 12:30 PM Corrine Kyra [facility]   11/20/2018  1:00 PM Olivia Mackie, MD [id number]

## 2018-09-19 LAB — WBC ADJUSTED: Lab: 3.6 — ABNORMAL LOW

## 2018-09-19 LAB — BASIC METABOLIC PANEL
ANION GAP: 10 mmol/L (ref 7–15)
BLOOD UREA NITROGEN: 12 mg/dL (ref 7–21)
CHLORIDE: 106 mmol/L (ref 98–107)
CO2: 24 mmol/L (ref 22.0–30.0)
CREATININE: 0.71 mg/dL (ref 0.60–1.00)
EGFR CKD-EPI AA FEMALE: >90 mL/min/{1.73_m2} (ref >=60)
EGFR CKD-EPI NON-AA FEMALE: 82 mL/min/{1.73_m2} (ref >=60)
GLUCOSE RANDOM: 89 mg/dL (ref 70–179)
POTASSIUM: 4.1 mmol/L (ref 3.5–5.0)

## 2018-09-19 LAB — CBC
HEMOGLOBIN: 8.8 g/dL — ABNORMAL LOW (ref 12.0–16.0)
MEAN CORPUSCULAR HEMOGLOBIN CONC: 31.8 g/dL (ref 31.0–37.0)
MEAN CORPUSCULAR HEMOGLOBIN: 31.3 pg (ref 26.0–34.0)
MEAN CORPUSCULAR VOLUME: 98.4 fL (ref 80.0–100.0)
MEAN PLATELET VOLUME: 8.5 fL (ref 7.0–10.0)
PLATELET COUNT: 266 10*9/L (ref 150–440)
RED BLOOD CELL COUNT: 2.81 10*12/L — ABNORMAL LOW (ref 4.00–5.20)
RED CELL DISTRIBUTION WIDTH: 18.1 % — ABNORMAL HIGH (ref 12.0–15.0)
WBC ADJUSTED: 3.6 10*9/L — ABNORMAL LOW (ref 4.5–11.0)

## 2018-09-19 LAB — ANION GAP: Anion gap 3:SCnc:Pt:Ser/Plas:Qn:: 10

## 2018-09-19 LAB — CA 15-3: Cancer Ag 15-3:ACnc:Pt:Ser/Plas:Qn:IA: 202 — ABNORMAL HIGH

## 2018-09-19 LAB — THYROID STIMULATING HORMONE: Thyrotropin:ACnc:Pt:Ser/Plas:Qn:: 1.01

## 2018-09-19 NOTE — Unmapped (Signed)
OCCUPATIONAL THERAPY  Evaluation(w/ PT Georgeann Oppenheim 2/2 pt activity tolerance) (09/19/18 1111)    Patient Name:  Katherine Sherman       Medical Record Number: [id number]   Date of Birth: [birth year]  Sex: Female          OT Treatment Diagnosis:  Decreased activity tolerance, and decreased functional cognition impacting safe participation in ADL/IADL routines        Assessment    Problem List: Decreased strength;Decreased endurance;Decreased mobility;Decreased cognition;Fall Risk    Clinical Decision Making: Moderate    Assessment: Katherine Sherman is a 78 y.o. woman with cognitive impairment/dementia, metastatic breast cancer (ER 95%, PR 70%, HER-2 negative, on Ibrance and letrozole), mucoepidermoid NSCLC (Stage IIB cT1c, pN1 s/p definitive radiation), presenting with fatigue, increased confusion, failure to thrive for the past 2 weeks.     Ms.Bohne presents to skilled acute OT services with decreased activity tolerance, and decreased functional cognition impacting safe participation in ADL/IADL routines. Pt completed and assisted with functional bed mobility, functional t/f, LB dressing, toileting routine, standing grooming task, and functional mobility to access ADL environments. Ms.Ridling continues to benefit from skilled acute OT services to address ADL deficits, and maximize safety and functional independence during daily routines. Based on consideration of pt's occupational profile, assessment review, level of clinical decision making involved, and intervention plan, this pt is considered to be a moderate complexity case. Ms.Adolf would benefit from 3x/week post-acute skilled OT services (with confirmed 24/7 for all ADL) to maximize safe participation in daily routines, life roles, and meaningful occupations.    Today's Interventions: Pt completed and assisted with functional bed mobility, functional t/f, LB dressing, toileting routine, standing grooming task, and functional mobility to access ADL environments.  Pt provided skilled education re: benefits of OOB ADL to promote increased activity tolerance, safe use of rw during ADL, safe t/f strategies, safety/falls education, role of acute OT, and POC.     Activity Tolerance During Today's Session  Patient tolerated treatment well    Plan  Planned Frequency of Treatment:  1-2x per day for: 3-4x week       Planned Interventions:  Adaptive equipment;ADL retraining;Balance activities;Endurance activities;Bed mobility;Conservation;Functional mobility;Education - Family / caregiver;Compensatory tech. training;Functional cognition;Education - Patient;Home exercise program;Range of motion;UE Strength / coordination exercise;Safety education;Postular / Proximal stability;Positioning;Therapeutic exercise;Transfer training    Post-Discharge Occupational Therapy Recommendations:  OT Post Acute Discharge Recommendations: 3x weekly   OT DME Recommendations: Three in one commode    GOALS:   Patient and Family Goals: To return home     Long Term Goal #1: Pt will score 22+ on AMPAC in 4 weeks        Short Term:  Pt will complete toilet transfer and toileting routine with mod I + LRAD    Time Frame : 2 weeks  Pt will complete LB dressing with supervision + LRAD    Time Frame : 2 weeks  Pt will complete standing multistep bimanual ADL with setup A requiring no cues for sequencing    Time Frame : 2 weeks                  Prognosis:  Good  Positive Indicators:  PLOF, agreeable to participate, support of family   Barriers to Discharge: Endurance deficits    Subjective  Current Status Pt received/left semi-reclined at bed level, call bell in reach, all immediate needs met, RN updated and aware  Prior Functional Status Prior to recent admission, Ms.Hohman reports independence  with ADL. She reports that she was not utilizing AD for functional mobility, and denies recent falls. She reports that she doesn't utilize DME for bathing routines. She reports that her family completes most IADL. She reports that her daughter works from home, but she is never in the home by herself, and family is available to assist, as needed.     Medical Tests / Procedures: CT Chest 6/19: Multiple infiltrative osseous lesions involving sternum and ribs with pathological fractures of sternal body . Left breast implant. Multiple surgical clips within the left axilla. Multilevel degenerative spinal changes. CT Abdomen Pelvis 6/19: Interval increase in bilateral adrenal gland nodularity, likely metastatic disease. Increased size of L3 and left ilium metastatic lesions with new metastases involving the L5 vertebral body and posterior left ninth rib. New centrally necrotic 4.0 cm soft tissue metastasis within the right iliac wing. New 1.3 cm posterior splenic metastatic lesion. Partially imaged increased anterior chest wall, pulmonary and pleural nodularity with left pleural effusion. Please see concurrent CT chest for further evaluation and additional findings.       Patient / Caregiver reports: They don't want me to be by myself. re: family     Past Medical History:   Diagnosis Date   ??? Borderline diabetes    ??? Breast cancer (CMS-HCC) 03/2016    left   ??? Forgetfulness    ??? HTN (hypertension)    ??? Hyperlipemia    ??? Lung cancer (CMS-HCC)     Social History     Tobacco Use   ??? Smoking status: Never Smoker   ??? Smokeless tobacco: Never Used   Substance Use Topics   ??? Alcohol use: No     Alcohol/week: 0.0 standard drinks      Past Surgical History:   Procedure Laterality Date   ??? BREAST BIOPSY Left 03/2016    br ca   ??? HYSTERECTOMY  1979   ??? PR BRNCHSC EBUS GUIDED SAMPL 1/2 NODE STATION/STRUX N/A 03/31/2018    Procedure: Bronch, Rigid Or Flexible, Inc Fluoro Guidance, When Performed; With Ebus Guided Transtracheal And/Or Transbronchial Sampling, One Or Two Mediastinal And/Or Hilar Lymph Node Stations Or Structures;  Surgeon: Sherwood Gambler, MD;  Location: MAIN OR [facility];  Service: Pulmonary   ??? PR BRNCHSC EBUS GUIDED SAMPL 3/> NODE STATION/STRUX N/A 06/30/2016    Procedure: Bronch, Rigid Or Flexible, Including Fluoro Guidance, When Performed; W Ebus Guided Transtracheal And/Or Transbronchial Sampling, 3 Or More Mediastinal And/Or Hilar Lymph Node Stations Or Structures;  Surgeon: Sherwood Gambler, MD;  Location: MAIN OR [facility];  Service: Pulmonary   ??? PR BRONCHOSCOPY,BIOPSY N/A 03/31/2018    Procedure: Bronchoscopy, Rigid/Flexible, Include Fluoro Guide When Performed; W/Bronch/Endobronch Bx, Single/Mult Site;  Surgeon: Sherwood Gambler, MD;  Location: MAIN OR [facility];  Service: Pulmonary   ??? PR MASTECTOMY, RADICAL, URBAN TYPE Left 04/26/2017    Procedure: Mastectomy, Radical, Including Pectoral Muscles, Axillary And Internal Mammary Lymph Nodes (Urban Type Op);  Surgeon: Aris Everts, MD;  Location: MAIN OR [facility];  Service: Surgical Oncology Breast   ??? RADIATION Left     x 1 month    Family History   Problem Relation Age of Onset   ??? No Known Problems Mother    ??? No Known Problems Father    ??? No Known Problems Sister    ??? Cancer Daughter         GIST   ??? No Known Problems Maternal Grandmother    ??? No Known Problems  Maternal Grandfather    ??? No Known Problems Paternal Grandmother    ??? No Known Problems Paternal Grandfather    ??? BRCA 1/2 Neg Hx    ??? Breast cancer Neg Hx    ??? Colon cancer Neg Hx    ??? Endometrial cancer Neg Hx    ??? Ovarian cancer Neg Hx    ??? Anesthesia problems Neg Hx    ??? Bleeding Disorder Neg Hx         Patient has no known allergies.     Objective Findings  Precautions / Restrictions  Falls precautions;Other precautions(no sternal precautions needed per MDE Amy, and per OT consult order)    Weight Bearing  Non-applicable    Required Braces or Orthoses  Non-applicable    Communication Preference  Verbal    Pain  pt denied pain     Equipment / Environment  Vascular access (PIV, TLC, Port-a-cath, PICC)    Living Situation  Living Environment: House  Lives With: Daughter;Family(daughter and son-in-law)  Home Living: One level home;Stairs to enter with rails;Tub/shower unit;Walk-in shower;Standard height toilet  Rail placement (outside): Rail on right side  Number of Stairs: 7     Cognition   Orientation Level:  Oriented x 4   Arousal/Alertness:  Appropriate responses to stimuli   Attention Span:  Appears intact   Memory:  Appears intact   Following Commands:  Follows multistep commands consistently   Safety Judgment:  Good awareness of safety precautions   Awareness of Errors:  Good awareness of safety precautions   Problem Solving:  Able to problem solve independently   Comments: Pt remained participatory and engaged throughout evaluation. She was oriented to location. She did self-initiation of looking to activity board for month, day, and year.     Vision / Perception    Hearing: appeared intact    Vision: No visual deficits  Perception: appeared intact       Hand Function  Hand Dominance: R  WFL     Skin Inspection  visible skin appeared intact    ROM / Strength/Coordination  UE ROM/ Strength/ Coordination: BUE AROM/strength appeared grossly WFL   LE ROM/ Strength/ Coordination: BLE AROM/strength appeared grossly WFL, figure four achieved at EOB    Sensation:  denied acute paresthesias     Balance:  SBA for dynamic sitting; CGA for dynamic standing     Functional Mobility  Transfer Assistance Needed: Yes  Transfers - Needs Assistance: Physical assistance required(CGA for sit<>stand without AD; min A for functional mobility to access bathroom without AD; CGA for mobility with rw to access ADL environments)  Bed Mobility Assistance Needed: Yes  Bed Mobility - Needs Assistance: Requires additional structure(SBA for sup with HOB elevated<>sitting EOB)      ADLs  ADLs: Needs assistance with ADLs  ADLs - Needs Assistance: Feeding;Grooming;Bathing;Toileting;UB dressing;LB dressing  Feeding - Needs Assistance: Requires additional structure(anticipate setup A for meal with independence for self-feeding)  Grooming - Needs Assistance: Physical assistance required;Requires additional structure(CGA progressing to SBA for washing hands standing sink-side in bathroom )  Bathing - Needs Assistance: Min assist(anticipated for seated bathing)  Toileting - Needs Assistance: Min assist(min A for toilet transfer; min A for clothing management; SBA for seated peri hygiene)  UB Dressing - Needs Assistance: Requires additional structure(anticipated)  LB Dressing - Needs Assistance: Requires additional structure(setup A for donning/doffing socks with pt seated at EOB,  pt utilized figure four to complete LB dressing task)      Vitals /  Orthostatics  At Rest: NAD  With Activity: NAD  Orthostatics: asymptomatic       Medical Staff Made Aware: RN       Occupational Therapy Session Duration  OT Individual - Duration: 20         I attest that I have reviewed the above information.  Signed: Danielle Dess, OT  Filed 09/19/2018

## 2018-09-19 NOTE — Unmapped (Signed)
MRI on hold until screen form is completed in epic. Please answer all the questions in the form. If patient isn't able to answer the questions, please have immediate family member to help answer the questions and list their name and their number in the form. Patients needs to be in a hospital gown, IV should be med locked and medication patches should be removed prior to coming to MRI. Please call MRI at [phone] if you have any question. Thank you!

## 2018-09-19 NOTE — Unmapped (Signed)
Problem: Adult Inpatient Plan of Care  Goal: Plan of Care Review  Outcome: Ongoing - Unchanged  Goal: Patient-Specific Goal (Individualization)  Outcome: Ongoing - Unchanged  Goal: Absence of Hospital-Acquired Illness or Injury  Outcome: Ongoing - Unchanged  Goal: Optimal Comfort and Wellbeing  Outcome: Ongoing - Unchanged  Goal: Readiness for Transition of Care  Outcome: Ongoing - Unchanged  Goal: Rounds/Family Conference  Outcome: Ongoing - Unchanged     Problem: Self-Care Deficit  Goal: Improved Ability to Complete Activities of Daily Living  Outcome: Ongoing - Unchanged     Problem: Fall Injury Risk  Goal: Absence of Fall and Fall-Related Injury  Outcome: Ongoing - Unchanged     Problem: Diabetes Comorbidity  Goal: Blood Glucose Level Within Desired Range  Outcome: Ongoing - Unchanged     Problem: Hypertension Comorbidity  Goal: Blood Pressure in Desired Range  Outcome: Ongoing - Unchanged

## 2018-09-19 NOTE — Unmapped (Deleted)
Problem: Wound  Goal: Optimal Wound Healing  Outcome: Ongoing - Unchanged

## 2018-09-19 NOTE — Unmapped (Signed)
PHYSICAL THERAPY  Evaluation (09/19/18 1110)     Patient Name:  Katherine Sherman       Medical Record Number: [id number]   Date of Birth: 08/26/1940  Sex: Female            Treatment Diagnosis: Impaired functional mobility    ASSESSMENT  Problem List: Decreased mobility;Gait deviation;Decreased endurance;Impaired balance;Fall Risk     Assessment : Katherine Sherman is a 78 y.o. woman with cognitive impairment/dementia, metastatic breast cancer (ER 95%, PR 70%, HER-2 negative, on Ibrance and letrozole), mucoepidermoid NSCLC (Stage IIB cT1c, pN1 s/p definitive radiation), presenting with acute uncomplicated cystitis. Pt pleasant and motivated to participate today. Pt performed transfers and ambulated a short household distance with CGA/SBA and RW. Pt ambulated a short distance without RW, but gait much unsteadier requiring Min A for balance. Based on the AM-PAC 6 item raw score of 18, the patient is considered to be 40% impaired with basic mobility. Per patient report she has 24/7 caregiver support of her daughter and son-in-law. Patient reports daughter works from home. Given her great support system, anticipate she will benefit from 3x wkly home health PT at d/c.     Clinical Complexity: Low       Today's Interventions: Pt educated re: role of PT, PT POC, benefits progressive mobility, activity pacing, importance OOB mobility, safe RW management, fall risk                          PLAN  Planned Frequency of Treatment:  1-2x per day for: 3-4x week      Planned Interventions: Airway Clearance;Balance activities;Education - Patient;Endurance activities;Education - Family / caregiver;Functional mobility;Gait training;Home exercise program;Postural re-education;Self-care / Home training;Stair training;Therapeutic exercise;Therapeutic activity;Transfer training    Post-Discharge Physical Therapy Recommendations:  3x weekly    PT DME Recommendations: Walker (rolling)           Goals:   Patient and Family Goals: to return home     Long Term Goal #1: Pt will ambulate >500 ft with supervision and LRAD to return to community activities within 4 wks        SHORT GOAL #1: Pt will perform sit to/from stand transfer with supervision and LRAD              Time Frame : 1 week  SHORT GOAL #2: Pt will ambulate 300 feet with supervision and LRAD              Time Frame : 1 week  SHORT GOAL #3: Pt will navigate 7 steps with unilateral rail with supervision              Time Frame : 1 week                                        Prognosis:  Good  Positive Indicators: Caregiver support   Barriers to Discharge: Endurance deficits    SUBJECTIVE  Patient reports: Pt agreeable to PT  Current Functional Status: RN cleared pt for activity. Pt received and left supine in bed with NAD, call bell within reach, and all lines intact at end of session.      Prior Functional Status: Pt reports PTA she performed household mobility without assistance, but does have 24/7 supervision of daughter and/or son-in-law for safety. Denies recent falls. Does not use a device.  Equipment available at home: None(Per patient report )     Past Medical History:   Diagnosis Date   ??? Borderline diabetes    ??? Breast cancer (CMS-HCC) 03/2016    left   ??? Forgetfulness    ??? HTN (hypertension)    ??? Hyperlipemia    ??? Lung cancer (CMS-HCC)     Social History     Tobacco Use   ??? Smoking status: Never Smoker   ??? Smokeless tobacco: Never Used   Substance Use Topics   ??? Alcohol use: No     Alcohol/week: 0.0 standard drinks      Past Surgical History:   Procedure Laterality Date   ??? BREAST BIOPSY Left 03/2016    br ca   ??? HYSTERECTOMY  1979   ??? PR BRNCHSC EBUS GUIDED SAMPL 1/2 NODE STATION/STRUX N/A 03/31/2018    Procedure: Bronch, Rigid Or Flexible, Inc Fluoro Guidance, When Performed; With Ebus Guided Transtracheal And/Or Transbronchial Sampling, One Or Two Mediastinal And/Or Hilar Lymph Node Stations Or Structures;  Surgeon: Sherwood Gambler, MD;  Location: MAIN OR [facility];  Service: Pulmonary   ??? PR BRNCHSC EBUS GUIDED SAMPL 3/> NODE STATION/STRUX N/A 06/30/2016    Procedure: Bronch, Rigid Or Flexible, Including Fluoro Guidance, When Performed; W Ebus Guided Transtracheal And/Or Transbronchial Sampling, 3 Or More Mediastinal And/Or Hilar Lymph Node Stations Or Structures;  Surgeon: Sherwood Gambler, MD;  Location: MAIN OR [facility];  Service: Pulmonary   ??? PR BRONCHOSCOPY,BIOPSY N/A 03/31/2018    Procedure: Bronchoscopy, Rigid/Flexible, Include Fluoro Guide When Performed; W/Bronch/Endobronch Bx, Single/Mult Site;  Surgeon: Sherwood Gambler, MD;  Location: MAIN OR [facility];  Service: Pulmonary   ??? PR MASTECTOMY, RADICAL, URBAN TYPE Left 04/26/2017    Procedure: Mastectomy, Radical, Including Pectoral Muscles, Axillary And Internal Mammary Lymph Nodes (Urban Type Op);  Surgeon: Aris Everts, MD;  Location: MAIN [facility];  Service: Surgical Oncology Breast   ??? RADIATION Left     x 1 month    Family History   Problem Relation Age of Onset   ??? No Known Problems Mother    ??? No Known Problems Father    ??? No Known Problems Sister    ??? Cancer Daughter         GIST   ??? No Known Problems Maternal Grandmother    ??? No Known Problems Maternal Grandfather    ??? No Known Problems Paternal Grandmother    ??? No Known Problems Paternal Grandfather    ??? BRCA 1/2 Neg Hx    ??? Breast cancer Neg Hx    ??? Colon cancer Neg Hx    ??? Endometrial cancer Neg Hx    ??? Ovarian cancer Neg Hx    ??? Anesthesia problems Neg Hx    ??? Bleeding Disorder Neg Hx         Allergies: Patient has no known allergies.                Objective Findings  Precautions / Restrictions  Precautions: Falls precautions;Other precautions(No sternal precautions needed per MDE Amy, and per PT consult order)  Weight Bearing Status: Non-applicable  Required Braces or Orthoses: Non-applicable    Communication Preference: Verbal   Pain Comments: Denies      Equipment / Environment: Vascular access (PIV, TLC, Port-a-cath, PICC);Patient wearing mask for full session    At Rest: VSS per EPIC     Orthostatics: Asymptomatic   Airway Clearance: OOB mobility    Living Situation  Living Environment: House  Lives With: Daughter;Family(Daughter and son-in-law)  Home Living: One level home;Stairs to enter with rails;Tub/shower unit;Walk-in shower;Standard height toilet  Rail placement (outside): Rail on right side  Number of Stairs: 7     Cognition: A&Ox3 with cueing from white board in room. Able to follow simple commands.           UE ROM: WFL  UE Strength: WFL  LE ROM: WFL  LE Strength: WFL                                    Bed Mobility: Pt performed supine to/from sit transfer with SBA and HOB partially elevated. Increased time to complete.   Transfers: Pt performed sit to/from stand transfer x 1 rep with CGA without device and x 2 reps with CGA and RW. Verbal cues for hand placement.    Gait  Gait: Pt ambulated 15 ft with Min A via HHA without device. Unsteady and reaching for walls/furniture for balance. Pt ambulated 15 ft and 55 ft with SBA/intermittent CGA and RW. Gait steadier with RW. Verbal cues for posture and RW proximity to body.   Stairs: Not assessed           Physical Therapy Session Duration  PT Individual - Duration: 20(w/ OT Eliezer Champagne for safe mobility )         I attest that I have reviewed the above information.  Signed: Hilaria Ota, PT  Filed 09/19/2018

## 2018-09-19 NOTE — Unmapped (Signed)
Medicine History and Physical    Assessment/Plan:  Principal Problem:    Cystitis  Active Problems:    Malignant neoplasm of left breast in female, estrogen receptor positive (CMS-HCC)    Essential hypertension    Malignant neoplasm of overlapping sites of lung (CMS-HCC)    Dementia without behavioral disturbance (CMS-HCC)    Malignant neoplasm of hilus of left lung (CMS-HCC)    Thyroid dysfunction    Failure to thrive in adult  Resolved Problems:    * No resolved hospital problems. *    Katherine Sherman is a 78 y.o. woman with cognitive impairment/dementia, metastatic breast cancer (ER 95%, PR 70%, HER-2 negative, on Ibrance and letrozole), mucoepidermoid NSCLC (Stage IIB cT1c, pN1 s/p definitive radiation), presenting with acute uncomplicated cystitis.    Hypoactive delirium: Like hepatic encephalopathy in the setting of cystitis. Patient is at increased risk given her history of underlying dementia.  -- treat cystitis  -- delirium precautions    Cystitis: Symptoms of frequency, dysuria. UA with +LE/nitrite, >182 WBC. No CVA tenderness, afebrile, hemodynamically stable.  -- f/u UCx  -- empiric treatment with nitrofurantoin x 5days    Stage IV breast cancer: Primary Oncologist is Dr. Archie Balboa. ER 95%, PR 70%, HER-2 negative. Diagnosed in 2018. Initially on letrozole and tamoxifen. Underwent L mastectomy on 03/2017, followed by radiation on 07/2017, IV CMF x8. Recent imaging showed metastatic disease to lung, pleura, LNs, adrenals, spleen, and bones. Tumor markers were also significantly elevated, including CEA 732, CA 27-29 of??807, and CA 15-3 of 225. Currently, on Letrozole and Ibrance  -- notify Dr. Archie Balboa of admission  -- radiation oncology consult for palliative RT to painful osseous lesions (R iliac wing mass)  -- pain control: PRN Acetaminophen  -- continue letrozole and ibrance in the AM    Chronic Medical Problems:    HTN:  -- continue home amlodipine 5 mg daily    HLD:  -- continue home ezetimibe 10 mg daily Daily Checklist:  Access: PIV  Diet: Full  Electrolytes: replete PRN  DVT PPx: enoxaparin SubQ  GI PPx: NA  Code Status: Full, discussed with patient and family on admission  Dispo: observation, E2    ___________________________________________________________________    Chief Complaint:  Cystitis    HPI:  Katherine Sherman is a 78 y.o. woman with cognitive impairment/dementia, metastatic breast cancer (ER 95%, PR 70%, HER-2 negative, on Ibrance and letrozole), mucoepidermoid NSCLC (Stage IIB cT1c, pN1 s/p definitive radiation), presenting with fatigue, increased confusion, failure to thrive for the past 2 weeks.     History obtained from daughter, patient, and EMR. The daughter reports that over the past 2 weeks her mom has been noticeably more fatigued and less interactive. The patient is spending most of her time in bed (>80%), and not interacting as much with other family members as usual. She also appears more disoriented than at baseline. The patient and the daughter report symptoms of increased urination. The patient also endorses dysuria that has worsened over the last few days. She has not received any antibiotics recently. She denies any history of frequent UTIs.     She was brought today to the [facility] clinic for evaluation of metastatic breast cancer. Unfortunately, re-staging scans showed significant interval progression of disease with worsening pulmonary, pleural, nodal, adrenal, splenic and osseous mets. As well as significantly elevated tumor markers.      On ROS she denies any fevers, chills, headaches, dyspnea, cough, chest pain, abdominal pain, nausea, vomiting, diarrhea,  sick contacts.     On arrival vitals are T 36.6, BP 142/67, HR 86, RR 16, SpO2 99 on room air. CBC showed anemia at baseline (Hgb 9.0), mild neutropenia (ANC 1.7), and lymphopenia (0.8). Otherwise unremarkable CMP except for mildly decreased albumin at 3.4. UA was remarkable for large LE, positive nitrite, >182 WBCs. Patient was admitted for observation and further management.     Oncology History    STRATA  ESR1 p.[id number]  [id number]  Estimated variant allele frequency:  25%  TP53 p.V272L  [id number].[id number]>C  Estimated variant allele frequency:  43%  MSS  Microsatellite Stable  TMB - Low  Mutations per MB: 3  Confidence interval: 1 - 8  PD-L1 - Low  RNA expression score: 14  The estimated tumor content was  less than 50%, potentially increasing  representation of PD-L1 expression  from non-tumor cells  Strata Immune Signature - Low    HARMONY  Luminal B        Malignant neoplasm of left breast in female, estrogen receptor positive (CMS-HCC)    03/2016 -  Presenting Symptoms     6 months of noticing left breast mass. Eventually got MMG which showed a 6 cm Left beast mass.       04/09/2016 Biopsy     IDC with DCIS, ER +, PR+., HER2 neg, Ki67 12%.       04/09/2016 Initial Diagnosis     Malignant neoplasm of left breast in female, estrogen receptor positive (RAF-HCC).       04/22/2016 -  Cancer Staged     MRI breast b/l:  inner lower quadrant left breast  measuring 4.8 x 4.6 x 6.5 cm with evidence of pectoralis muscle invasion, but no involvement of underlying chest wall. Abnormal left internal mammary lymph node measuring 9 x 16 mm. (cN2b ipsilat int mammary without axillary LN). No suspicious axillary adenopathy.    [place] read of this MRI: 2 left internal mammary nodes 1.6 and 0.6 cm, Left level 1 axillary node 1.3 cm     This makes it cN3b (ispilateral internal mammary and axillary LN)    However [place] read of targeted left axillary US showed no abnormal LN.     Stage IIIA vs IIIC [T3N2b vs N3c]          04/27/2016 -  Other     Oncotype DX testing sent by primary oncologist, Dr Pamelia Hoit at [facility]  to guide decision for neoadjuvant chemotherapy vs endocrine therapy. RS = 19.       04/27/2016 Endocrine/Hormone Therapy     Started on Neoadjuvant Endocrine therapy with Letrazole       06/17/2016 -  Cancer Staged     CT C/A/P:   2.6 cm left hilar mass, several LLL nodules up to 1.0 cm and a 2.5 cm LLL nodule. Several liver hypodensities up to 1.0 cm - indeterminate.  Known left breast mass 5.5 cm involving the pectoralis muscle.     Bone scan negative. Brain MRI negative.         Malignant neoplasm of overlapping sites of lung (CMS-HCC)    06/17/2016 -  Cancer Staged     CT C/A/P: 2.6 cm left hilar mass, several LLL nodules up to 1.0 cm and a 2.5 cm LLL nodule. Several liver hypodensities up to 1.0 cm - indeterminate.  Known left breast mass.     MRI brain - negative        06/30/2016 Biopsy     EBUS:  N2, N3 Ln negative . 11L LN + for mucoepidermoid NSCLC.       07/06/2016 Initial Diagnosis     Malignant neoplasm of overlapping sites of lung (CMS-HCC). 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 Metastatic adenocarcinoma    The previous lung carcinoma ([id number]) demonstrating a mucoepidermoid of the lung is reviewed as well as the previous ductal carcinoma of the breast ([id number]???[id number]) and the current malignancy is morphologically similar to the breast carcinoma. ??Panel of immunohistochemical stains also supports that the metastatic adenocarcinoma is of breast origin based on positive staining of the malignant cells in the cell block for Gata-3, estrogen receptor protein (moderate to strong staining, 60%), and progesterone receptor protein (mild to moderate staining, 30%) with 0 negative staining for HER-2/neu. ??Immunohistochemical stain for P 40 as would be expected in an muco-epidermoid carcinoma is negative.       Allergies:  Patient has no known allergies.    Medications:   Prior to Admission medications    Medication Dose, Route, Frequency   amLODIPine (NORVASC) 5 MG tablet 5 mg, Oral, Daily (standard)   benzonatate (TESSALON PERLES) 100 MG capsule 100 mg, Oral, 3 times a day PRN   ezetimibe (ZETIA) 10 mg tablet 10 mg, Oral, Daily (standard)   fluticasone propionate (FLONASE) 50 mcg/actuation nasal spray 1 spray, Each Nare, Daily (standard)   ibuprofen (ADVIL,MOTRIN) 200 MG tablet 200 mg, Oral, Daily PRN   letrozole (FEMARA) 2.5 mg tablet 2.5 mg, Oral, Daily (standard)   palbociclib 75 mg Tab 75 mg, Oral, Daily, For 21 days then 7 days off     Medical History:  Past Medical History:   Diagnosis Date   ??? Borderline diabetes    ??? Breast cancer (CMS-HCC) 03/2016    left   ??? Forgetfulness    ??? HTN (hypertension)    ??? Hyperlipemia    ??? Lung cancer (CMS-HCC)      Surgical History:  Past Surgical History:   Procedure Laterality Date   ??? BREAST BIOPSY Left 03/2016    br ca   ??? HYSTERECTOMY  1979   ??? PR BRNCHSC EBUS GUIDED SAMPL 1/2 NODE STATION/STRUX N/A 03/31/2018    Procedure: Bronch, Rigid Or Flexible, Inc Fluoro Guidance, When Performed; With Ebus Guided Transtracheal And/Or Transbronchial Sampling, One Or Two Mediastinal And/Or Hilar Lymph Node Stations Or Structures;  Surgeon: Sherwood Gambler, MD;  Location: MAIN [facility];  Service: Pulmonary   ??? PR BRNCHSC EBUS GUIDED SAMPL 3/> NODE STATION/STRUX N/A 06/30/2016    Procedure: Bronch, Rigid Or Flexible, Including Fluoro Guidance, When Performed; W Ebus Guided Transtracheal And/Or Transbronchial Sampling, 3 Or More Mediastinal And/Or Hilar Lymph Node Stations Or Structures;  Surgeon: Sherwood Gambler, MD;  Location: MAIN OR [facility];  Service: Pulmonary   ??? PR BRONCHOSCOPY,BIOPSY N/A 03/31/2018    Procedure: Bronchoscopy, Rigid/Flexible, Include Fluoro Guide When Performed; W/Bronch/Endobronch Bx, Single/Mult Site;  Surgeon: Sherwood Gambler, MD;  Location: MAIN OR [facility];  Service: Pulmonary   ??? PR MASTECTOMY, RADICAL, URBAN TYPE Left 04/26/2017    Procedure: Mastectomy, Radical, Including Pectoral Muscles, Axillary And Internal Mammary Lymph Nodes (Urban Type Op);  Surgeon: Aris Everts, MD;  Location: MAIN OR [facility];  Service: Surgical Oncology Breast   ??? RADIATION Left  x 1 month     Social History:  Social History     Socioeconomic History   ??? Marital status: Divorced     Spouse name: Not on file   ??? Number of children: Not on file   ??? Years of education: Not on file   ??? Highest education level: Not on file   Occupational History   ??? Not on file   Social Needs   ??? Financial resource strain: Not on file   ??? Food insecurity     Worry: Not on file     Inability: Not on file   ??? Transportation needs     Medical: Not on file     Non-medical: Not on file   Tobacco Use   ??? Smoking status: Never Smoker   ??? Smokeless tobacco: Never Used   Substance and Sexual Activity   ??? Alcohol use: No     Alcohol/week: 0.0 standard drinks   ??? Drug use: No   ??? Sexual activity: Not on file   Lifestyle   ??? Physical activity     Days per week: Not on file     Minutes per session: Not on file   ??? Stress: Not on file   Relationships   ??? Social Wellsite geologist on phone: Not on file     Gets together: Not on file     Attends religious service: Not on file     Active member of club or organization: Not on file     Attends meetings of clubs or organizations: Not on file     Relationship status: Not on file   Other Topics Concern   ??? Not on file   Social History Narrative    She lives with her daughter Linton Rump, son-in-law Michele Mcalpine, and her grandson in [place], [place] (lived independently until Summer 2018). 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 She is divorced. She has four children - one deceased (metastatic GIST 06-17-2015), three in [place] ([place]my, [place] in [place], and [place] in [place]). Denies smoking, EtOH. She requires assistance in all iADLs but is independent in ADLs.     Family History:  Family History   Problem Relation Age of Onset   ??? No Known Problems Mother    ??? No Known Problems Father    ??? No Known Problems Sister    ??? Cancer Daughter         GIST   ??? No Known Problems Maternal Grandmother    ??? No Known Problems Maternal Grandfather    ??? No Known Problems Paternal Grandmother    ??? No Known Problems Paternal Grandfather    ??? BRCA 1/2 Neg Hx    ??? Breast cancer Neg Hx    ??? Colon cancer Neg Hx    ??? Endometrial cancer Neg Hx    ??? Ovarian cancer Neg Hx    ??? Anesthesia problems Neg Hx    ??? Bleeding Disorder Neg Hx      Review of Systems:  10 systems reviewed and are negative unless otherwise mentioned in HPI    Labs/Studies:  Labs and Studies from the last 24hrs per EMR and Reviewed and   All lab results last 24 hours:    Recent Results (from the past 24 hour(s))   Magnesium Level    Collection Time: 09/18/18  3:10 PM   Result Value Ref Range    Magnesium 2.2  1.6 - 2.2 mg/dL   Comprehensive Metabolic Panel    Collection Time: 09/18/18  3:10 PM   Result Value Ref Range    Sodium 141 135 - 145 mmol/L    Potassium 3.8 3.5 - 5.0 mmol/L    Chloride 102 98 - 107 mmol/L    Anion Gap 12 7 - 15 mmol/L    CO2 27.0 22.0 - 30.0 mmol/L    BUN 16 7 - 21 mg/dL    Creatinine 1.02 7.25 - 1.00 mg/dL    BUN/Creatinine Ratio 18     EGFR CKD-EPI Non-African American, Female 61 >=60 mL/min/1.37m2    EGFR CKD-EPI African American, Female 70 >=60 mL/min/1.50m2    Glucose 134 70 - 179 mg/dL    Calcium 9.4 8.5 - 36.6 mg/dL    Albumin 3.4 (L) 3.5 - 5.0 g/dL    Total Protein 7.0 6.5 - 8.3 g/dL    Total Bilirubin 0.2 0.0 - 1.2 mg/dL    AST 19 14 - 38 U/L    ALT 9 <35 U/L    Alkaline Phosphatase 80 38 - 126 U/L   CBC w/ Differential    Collection Time: 09/18/18  3:10 PM   Result Value Ref Range    WBC 3.1 (L) 4.5 - 11.0 10*9/L    RBC 2.85 (L) 4.00 - 5.20 10*12/L    HGB 9.0 (L) 12.0 - 16.0 g/dL    HCT 44.0 (L) 34.7 - 46.0 %    MCV 97.8 80.0 - 100.0 fL    MCH 31.6 26.0 - 34.0 pg    MCHC 32.4 31.0 - 37.0 g/dL    RDW 42.5 (H) 95.6 - 15.0 %    MPV 9.2 7.0 - 10.0 fL    Platelet 275 150 - 440 10*9/L    Neutrophils % 52.7 %    Lymphocytes % 24.4 %    Monocytes % 15.9 %    Eosinophils % 2.1 %    Basophils % 1.5 %    Absolute Neutrophils 1.7 (L) 2.0 - 7.5 10*9/L    Absolute Lymphocytes 0.8 (L) 1.5 - 5.0 10*9/L    Absolute Monocytes 0.5 0.2 - 0.8 10*9/L    Absolute Eosinophils 0.1 0.0 - 0.4 10*9/L    Absolute Basophils 0.1 0.0 - 0.1 10*9/L    Large Unstained Cells 3 0 - 4 %    Macrocytosis Moderate (A) Not Present    Anisocytosis Slight (A) Not Present    Hypochromasia Slight (A) Not Present   Urinalysis    Collection Time: 09/18/18  4:00 PM   Result Value Ref Range    Color, UA Yellow     Clarity, UA Cloudy     Specific Gravity, UA 1.013 1.003 - 1.030    pH, UA 5.0 5.0 - 9.0    Leukocyte Esterase, UA Large (A) Negative    Nitrite, UA Positive (A) Negative    Protein, UA 30 mg/dL (A) Negative    Glucose, UA Negative Negative    Ketones, UA Negative Negative    Urobilinogen, UA 0.2 mg/dL 0.2 mg/dL, 1.0 mg/dL    Bilirubin, UA Negative Negative    Blood, UA Small (A) Negative    RBC, UA 12 (H) <=4 /HPF    WBC, UA >182 (H) 0 - 5 /HPF    WBC Clumps Few (A) None Seen /HPF    Squam Epithel, UA <1 0 - 5 /HPF    Bacteria, UA Many (A) None Seen /HPF    Trans  Epithel, UA <1 0 - 2 /HPF    Mucus, UA Occasional (A) None Seen /HPF     Physical Exam:  Temp:  [36.6 ??C-36.9 ??C] 36.9 ??C  Heart Rate:  [86-93] 93  Resp:  [16-18] 18  BP: (140-151)/(67-74) 151/74  SpO2:  [99 %] 99 %    General: Frail appearing, in no acute distress, sitting comfortably and accompanied by her daughter  HEENT: normocephalic, atraumatic, EOMI, anicteric sclera, no conjunctival injection, moist mucous membranes, no erythema, no lesions  Chest/Lungs: normal work of breathing, clear to auscultation, anatomy consistent with post mastectomy  Heart: normal rate, regular rhythm, normal S1, S2, no murmurs  Abdomen: soft, nontender, nondistended, no masses or organomegaly  Musculoskeletal: no joint tenderness, deformity or swelling  Extremities: warm and well perfused, no pedal edema  Skin: normal coloration and turgor, no rashes, no suspicious skin lesions noted  Neurological: alert, oriented, normal speech, no focal findings or movement disorder noted    Ariea Rochin C. Bradly Bienenstock, MD, PhD  Internal Medicine, PGY-1

## 2018-09-19 NOTE — Unmapped (Signed)
78 yo female with left breast cancer admitted from the outpatient clinic with failure to thrive and cystitis, she has PMH that also includes dementia, per her family they noticed worsening dementia.  Pt is alert and oriented, forgetful, pleasant and follows commands. Falls and delirium protocols in place.  Up with one person assistance to bsc, pt unsteady, weak on feet, moves all extremities, skin intact.   Complains of burning and frequency upon urinating, UA and culture sent (see labs in Epic.) Macrobid scheduled to start today (see orders and EMAR.)  Pt has consults for RAD ONC, PT/O.  No ss of acute distress observed, safe environment maintained, oriented to call bell with each encounter, bed alarm enabled at all times, will continue to monitor.

## 2018-09-19 NOTE — Unmapped (Signed)
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  She reports that last July 2019, pt moved in with her.  She was living in her home not caring for herself and with no AC running.  Amy reports that caring for her mother has put stress on her and her husband, but other siblings did not want to take mother in.  Husband is quite supportive.  Amy lost her sister (pt's daughter) 3 years ago to GIST.  She is open to whatever services will best help her mother.  CM did bring up palliative care for mother, due to c/o's pain    Contact/Decision Maker  Extended Emergency Contact Information  Primary Emergency Contact: Berneice Heinrich  Mobile Phone: [phone]  Relation: Daughter  Secondary Emergency Contact: Betsey Holiday  Mobile Phone: [phone]  Relation: Son    Type of Residence: Mailing Address:  [address]  [place] [address]  Contacts:    Patient Phone Number: [phone] (Amy)        Medical Provider(s): Corwin Levins, MD  Reason for Admission: Admitting Diagnosis:  FTT  Past Medical History:   has a past medical history of Borderline diabetes, Breast cancer (CMS-HCC) (03/2016), Forgetfulness, HTN (hypertension), Hyperlipemia, and Lung cancer (CMS-HCC).  Past Surgical History: has a past surgical history that includes Hysterectomy (1979); Breast biopsy (Left, 03/2016); pr brnchsc ebus guided sampl 3/> node station/strux (N/A, 06/30/2016); Radiation (Left); pr mastectomy, radical, urban type (Left, 04/26/2017); pr brnchsc ebus guided sampl 1/2 node station/strux (N/A, 03/31/2018); and pr bronchoscopy,biopsy (N/A, 03/31/2018).   Previous admit date: 04/26/2017    Primary Insurance- Payor: MEDICARE / Plan: MEDICARE PART A AND PART B / Product Type: *No Product type* /   Secondary Insurance ??? Secondary Insurance  [employer]  Prescription Coverage ??? [facility]  Preferred Pharmacy - [phone] - [place], [place] - [address]  [facility]  [facility] - [place], [place] - [address]    Transportation home: Private vehicle  Level of function prior to admission: Requires Contractor Next of Kin / Guardian / POA / Advance Directives   Advance Directive (Medical Treatment)  Patient has advance directive in chart; see demographics under snapshot  Healthcare Decision Maker: HCDM documented in the HCDM/Contact Info section.    Health Care Decision Maker [HCDM] (Medical & Mental Health Treatment)  Healthcare Decision Maker: HCDM documented in the HCDM/Contact Info section.  Information offered on HCDM, Medical & Mental Health advance directives:: Patient declined information.    Advance Directive (Mental Health Treatment)  Does patient have an advance directive covering mental health treatment?: Patient does not have advance directive covering mental health treatment.  Reason patient does not have an advance directive covering mental health treatment:: HCDM documented in  the HCDM/Contact Info section.    Patient Information  Lives with: Children, Family members; daughter, son in law and grandson    Type of Residence: Private residence  Location/Detail: [place]    Support Systems: Children, Family Members    Responsibilities/Dependents at home?: No Home Care services in place prior to admission?: No    Equipment Currently Used at Home: walker, rolling  Currently receiving outpatient dialysis?: No     Financial Information    Need for financial assistance?: No    Social Determinants of Health  Social Determinants of Health were addressed in provider documentation.  Please refer to patient history.    Discharge Needs Assessment  Concerns to be Addressed: discharge planning, adjustment to diagnosis/illness, cognitive/perceptual    Clinical Risk Factors: > 65, Functional Limitations, Principal Diagnosis: Cancer, Stroke, COPD, Heart Failure, AMI, Pneumonia, Joint Replacment, Multiple Diagnoses (Chronic), Other (Comment)    Barriers to taking medications: No    Prior overnight hospital stay or ED visit in last 90 days: No    Readmission Within the Last 30 Days: no previous admission in last 30 days    Anticipated Changes Related to Illness: inability to care for self    Equipment Needed After Discharge: other (see comments) TBD    Discharge Facility/Level of Care Needs:      Readmission  Risk of Unplanned Readmission Score:  %  Predictive Model Details           6% (Low) Factors Contributing to Score   Calculated 09/19/2018 10:28 28% Active NSAID Rx order is present   [facility] Risk of Unplanned Readmission Model 15% Diagnosis of cancer is present     14% Number of active Rx orders is 14     9% Imaging order is present in last 6 months     9% Age is 78     9% Latest hemoglobin is low (8.8 g/dL)     8% Charlson Comorbidity Index is 5     6% Active anticoagulant Rx order is present     Readmitted Within the Last 30 Days? no  Patient at risk for readmission?: No    Discharge Plan  Screen findings are: Discharge planning needs identified or anticipated (Comment).  Pending, but may need HH again, perhaps even palliative care.  Pt had [facility] before (2019); [phone], fax: [phone]    Expected Discharge Date: 09/22/18    Expected Transfer from Critical Care: Patient and/or family were provided with choice of facilities / services that are available and appropriate to meet post hospital care needs?: Yes       Initial Assessment complete?: Yes

## 2018-09-19 NOTE — Unmapped (Addendum)
Katherine Sherman is a 78 y.o. 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 She was started on cefdinir to complete a 5 day total course (6/23 - 6/27).

## 2018-09-20 ENCOUNTER — Emergency Department (HOSPITAL_COMMUNITY)
Admission: EM | Admit: 2018-09-20 | Discharge: 2018-09-21 | Disposition: A | Discharge status: Home / Self Care | Payer: Medicare Other | Attending: Emergency Medicine | Admitting: Emergency Medicine

## 2018-09-20 ENCOUNTER — Encounter (HOSPITAL_COMMUNITY): Payer: Self-pay | Admitting: Emergency Medicine

## 2018-09-20 ENCOUNTER — Emergency Department (HOSPITAL_COMMUNITY): Payer: Medicare Other

## 2018-09-20 ENCOUNTER — Other Ambulatory Visit: Payer: Self-pay

## 2018-09-20 DIAGNOSIS — N3000 Acute cystitis without hematuria: Principal | ICD-10-CM

## 2018-09-20 DIAGNOSIS — Z79899 Other long term (current) drug therapy: Secondary | ICD-10-CM | POA: Insufficient documentation

## 2018-09-20 DIAGNOSIS — C7981 Secondary malignant neoplasm of breast: Secondary | ICD-10-CM | POA: Diagnosis not present

## 2018-09-20 DIAGNOSIS — R1031 Right lower quadrant pain: Secondary | ICD-10-CM | POA: Diagnosis not present

## 2018-09-20 DIAGNOSIS — E119 Type 2 diabetes mellitus without complications: Secondary | ICD-10-CM | POA: Diagnosis not present

## 2018-09-20 DIAGNOSIS — W010XXA Fall on same level from slipping, tripping and stumbling without subsequent striking against object, initial encounter: Secondary | ICD-10-CM | POA: Diagnosis not present

## 2018-09-20 DIAGNOSIS — Y92019 Unspecified place in single-family (private) house as the place of occurrence of the external cause: Secondary | ICD-10-CM | POA: Diagnosis not present

## 2018-09-20 DIAGNOSIS — Y9301 Activity, walking, marching and hiking: Secondary | ICD-10-CM | POA: Diagnosis not present

## 2018-09-20 DIAGNOSIS — Y999 Unspecified external cause status: Secondary | ICD-10-CM | POA: Insufficient documentation

## 2018-09-20 DIAGNOSIS — Z03818 Encounter for observation for suspected exposure to other biological agents ruled out: Secondary | ICD-10-CM | POA: Insufficient documentation

## 2018-09-20 DIAGNOSIS — Z87891 Personal history of nicotine dependence: Secondary | ICD-10-CM | POA: Diagnosis not present

## 2018-09-20 DIAGNOSIS — W19XXXA Unspecified fall, initial encounter: Secondary | ICD-10-CM

## 2018-09-20 DIAGNOSIS — C799 Secondary malignant neoplasm of unspecified site: Secondary | ICD-10-CM

## 2018-09-20 DIAGNOSIS — S0990XA Unspecified injury of head, initial encounter: Secondary | ICD-10-CM | POA: Diagnosis present

## 2018-09-20 DIAGNOSIS — I10 Essential (primary) hypertension: Secondary | ICD-10-CM | POA: Diagnosis not present

## 2018-09-20 LAB — CBC
HEMATOCRIT: 27.6 % — ABNORMAL LOW (ref 36.0–46.0)
HEMOGLOBIN: 8.8 g/dL — ABNORMAL LOW (ref 12.0–16.0)
MEAN CORPUSCULAR HEMOGLOBIN CONC: 32.1 g/dL (ref 31.0–37.0)
MEAN CORPUSCULAR HEMOGLOBIN: 31.5 pg (ref 26.0–34.0)
MEAN CORPUSCULAR VOLUME: 98 fL (ref 80.0–100.0)
MEAN PLATELET VOLUME: 8.8 fL (ref 7.0–10.0)
RED BLOOD CELL COUNT: 2.81 10*12/L — ABNORMAL LOW (ref 4.00–5.20)
RED CELL DISTRIBUTION WIDTH: 17.8 % — ABNORMAL HIGH (ref 12.0–15.0)
WBC ADJUSTED: 3.4 10*9/L — ABNORMAL LOW (ref 4.5–11.0)

## 2018-09-20 LAB — BASIC METABOLIC PANEL
BLOOD UREA NITROGEN: 10 mg/dL (ref 7–21)
CALCIUM: 9.3 mg/dL (ref 8.5–10.2)
CHLORIDE: 102 mmol/L (ref 98–107)
CO2: 24 mmol/L (ref 22.0–30.0)
EGFR CKD-EPI AA FEMALE: 86 mL/min/{1.73_m2} (ref >=60)
EGFR CKD-EPI NON-AA FEMALE: 74 mL/min/{1.73_m2} (ref >=60)
GLUCOSE RANDOM: 110 mg/dL (ref 70–179)
POTASSIUM: 4 mmol/L (ref 3.5–5.0)
SODIUM: 138 mmol/L (ref 135–145)

## 2018-09-20 LAB — EGFR CKD-EPI AA FEMALE: Lab: 86

## 2018-09-20 LAB — WBC ADJUSTED: Lab: 3.4 — ABNORMAL LOW

## 2018-09-20 MED ORDER — CEFDINIR 300 MG CAPSULE
ORAL_CAPSULE | Freq: Two times a day (BID) | ORAL | 0 refills | 0.00000 days | Status: CP
Start: 2018-09-20 — End: 2018-09-23

## 2018-09-20 NOTE — Unmapped (Signed)
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     LOS: 0 days      Failure to Thrive    Hypoactive delirium: Like hepatic encephalopathy in the setting of cystitis. Patient is at increased risk given her history of underlying dementia.  -- treat cystitis  -- delirium precautions  ??  Cystitis: Symptoms of frequency, dysuria. UA with +LE/nitrite, >182 WBC. No CVA tenderness, afebrile, hemodynamically stable. Urine culture grows E.coli.  -- continue empiric treatment with nitrofurantoin x 5days  ??  Stage IV breast cancer: Primary Oncologist is Dr. Archie Balboa. ER 95%, PR 70%, HER-2 negative. Diagnosed in 2018. Initially on letrozole and tamoxifen. Underwent L mastectomy on 03/2017, followed by radiation on 07/2017, IV CMF x8. Recent imaging showed metastatic disease to lung, pleura, LNs, adrenals, spleen, and bones. Tumor markers were also significantly elevated, including??CEA 732,??CA 27-29??of??807, and CA 15-3??of??225. Currently, on Letrozole and Ibrance  -- notify Dr. Archie Balboa of admission  -- radiation oncology consult for palliative RT to painful osseous lesions (R iliac wing mass)  -- pain control: PRN Acetaminophen  -- continue letrozole and ibrance in the AM  -- MRI shows new enhancing occipital lesion, and would like to receive radiation closer to her home. Chronic Medical Problems:  ??  HTN:  -- continue home amlodipine 5 mg daily  ??  HLD:  -- continue home ezetimibe 10 mg daily  ??  Daily Checklist:  Access: PIV  Diet:??Full  Electrolytes:??replete PRN  DVT PPx:??enoxaparin SubQ  GI PPx: NA  Code Status: Full, discussed with patient and family on admission  Dispo:??observation, E2  ??    Subjective:     Interval History: No acute overnight events with no complaints.     Objective:     Vital signs in last 24 hours:  Temp:  [36.4 ??C-36.8 ??C] 36.5 ??C  Heart Rate:  [92-106] 95  Resp:  [18-20] 18  BP: (133-161)/(73-91) 148/77  MAP (mmHg):  [90] 90  SpO2:  [96 %-98 %] 98 %    Intake/Output last 3 shifts:  I/O last 3 completed shifts:  In: 830 [P.O.:830]  Out: 2800 [Urine:2800]    Physical Exam:    Physical Exam  Constitutional:       Appearance: Normal appearance. She is ill-appearing.   HENT:      Head: Normocephalic and atraumatic.   Cardiovascular:      Rate and Rhythm: Normal rate and regular rhythm.      Heart sounds: Normal heart sounds.   Pulmonary:      Effort: Pulmonary effort is normal.      Breath sounds: Normal breath sounds.   Abdominal:      General: Abdomen is flat. Bowel sounds are normal.      Palpations: Abdomen is soft.   Skin:     General:  Skin is warm and dry.   Neurological:      General: No focal deficit present.      Mental Status: She is alert. Mental status is at baseline.   Psychiatric:         Mood and Affect: Mood normal.         Behavior: Behavior normal.

## 2018-09-20 NOTE — Unmapped (Signed)
You have a radiation oncology appointment on 09/21/2018 at 2PM with Dr. Dorothyann Gibbs.  Please give them a call at [phone] if you need to reschedule this appointment.    [facility]   [address] -- [address] Floor  [place], [place] [address]

## 2018-09-20 NOTE — Unmapped (Signed)
VSS, pt alert and oriented, forgetful and w/ poor attention and concentration worsening throughout night. Bed alarm remains on, pt remained free of falls and injury. Pt denies pain or burning with urination, noted to be yellow and clear. Save L arm precautions initiated. Pt still awaiting brain MRI. No concerns at this time, WCTM.     Problem: Adult Inpatient Plan of Care  Goal: Plan of Care Review  Outcome: Ongoing - Unchanged  Goal: Patient-Specific Goal (Individualization)  Outcome: Ongoing - Unchanged  Goal: Optimal Comfort and Wellbeing  Outcome: Ongoing - Unchanged

## 2018-09-20 NOTE — Unmapped (Signed)
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    We recommend SRS in 1 fraction to small brain metastasis and 5-10 fx palliative radiation to right iliac crest lesion. This was discussed with patient's daughter by phone. Also attempted to call patient's room with no answer. Her daughter would prefer radiation closer to home. We will refer to Dr. Dorothyann Gibbs at [facility].    Arna Medici, MD PhD  Radiation Oncology PGY-3  Pager: [phone]  09/18/2018 12:27 PM

## 2018-09-20 NOTE — Unmapped (Signed)
78 y.o. F with mucoepidermoid carcinoma in LLL treated with definitive SBRT (40Gy, 07/2016), ER 95%/PR 70%/HER2- [id number] breast IDC that progressed on endocrine therapy now s/p mastectomy and found to be metastatic to lung in 03/2018. Admitted with delirium, likely d/t cystitis however no recent brain imaging since found to have metastatic cancer with with progression in bone, adrenal, spleen, and lung.     We recommend brain MRI. We were consulted to discuss radiation to right iliac wing. On discussion with patient, denies any pain but very confused (hung up because she believed I was a scam). On discussion with daughter, she only complains of right hip/side pain and usually denies pain. Her daughter would prefer she be treated closer to home if possible. If brain MRI is negative, I will refer her to [facility] for palliative radiation to her right iliac crest. If brain lesions are present requiring CK, that will need to be done here and we can treat right iliac crest at same time. This plan was discussed with daughter and we will follow up tomorrow pending brain MRI.    Arna Medici, MD PhD  Radiation Oncology PGY-3  Pager: [phone]  09/18/2018 5:49 PM

## 2018-09-20 NOTE — Unmapped (Signed)
This patient was not seen in person. The clinical nutrition service has moved to a liaison model to minimize potential spread of COVID-19, protect patients/providers and reduce PPE utilization.  During this time, we will be limiting person-to-person contact when possible.    Adult Nutrition Assessment Note    Visit Type: RN Consult, RD Risk Alert  Reason for Visit: Have you had a decrease in food intake or appetite?, Other (Specify)(dx. FTT)    ASSESSMENT:   HPI & PMH: Katherine Sherman is a 78 y.o. 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   ??  History obtained from daughter, patient, and EMR. The daughter reports that over the past 2 weeks her mom has been noticeably more fatigued and less interactive. The patient is spending most of her time in bed (>80%), and not interacting as much with other family members as usual. She also appears more disoriented than at baseline. The patient and the daughter report symptoms of increased urination. The patient also endorses dysuria that has worsened over the last few days. She has not received any antibiotics recently. She denies any history of frequent UTIs.   Past Medical History:   Diagnosis Date   ??? Borderline diabetes    ??? Breast cancer (CMS-HCC) 03/2016    left   ??? Forgetfulness    ??? HTN (hypertension)    ??? Hyperlipemia    ??? Lung cancer (CMS-HCC)        Nutrition Hx: RD working remotely for infection prevention, spoke with patient via hospital phone:  Attempted call Pt's daughter, unavailable to answer. RN reports, Pt reports eating much better this am and asking for snack of milk and ice cream right now.    Nutritionally Pertinent Meds: none significant to present nutrition status  Labs: none significant to nutrition status at present  Lab Results   Component Value Date    NA 138 09/20/2018    K 4.0 09/20/2018    CL 102 09/20/2018    CO2 24.0 09/20/2018    BUN 10 09/20/2018    CREATININE 0.77 09/20/2018    GLU 110 09/20/2018    CALCIUM 9.3 09/20/2018    ALBUMIN 3.4 (L) 09/18/2018        Abd/GI: PO intake per flowsheet 75% meals 09/19/18   Skin:   Patient Lines/Drains/Airways Status    Active Wounds     Name:   Placement date:   Placement time:   Site:   Days:    Surgical Site 04/26/17 Breast Left   04/26/17    1155     511    Surgical Site 04/26/17 Axilla Left   04/26/17    1156     511               Current nutrition therapy order:   Nutrition Orders          Nutrition Therapy General (Regular) starting at 06/22 2029           Anthropometric Data:  -- Height: 157.5 cm (5' 2)   -- Last recorded weight: 51 kg (112 lb 6.4 oz)  -- Admission weight: 51.3 kg (113 lb 1.5 oz)  -- IBW: 49.97 kg  -- Percent IBW: 102.03 %  -- BMI: Body mass index is 20.56 kg/m??.   -- Weight changes this  admission:   Last 5 Recorded Weights    09/18/18 1458 09/18/18 2033   Weight: 51.3 kg (113 lb 1.5 oz) 51 kg (112 lb 6.4 oz)      -- Weight history PTA: 7.4% loss since 04/10/18  Wt Readings from Last 10 Encounters:   09/18/18 51 kg (112 lb 6.4 oz)   04/10/18 55 kg (121 lb 3.2 oz)   03/28/18 56.2 kg (123 lb 12.8 oz)   01/16/18 55.7 kg (122 lb 12.7 oz)   01/16/18 55.2 kg (121 lb 12.8 oz)   12/29/17 56.1 kg (123 lb 9.6 oz)   12/26/17 55.1 kg (121 lb 7.6 oz)   12/26/17 55.1 kg (121 lb 6.4 oz)   12/05/17 54.7 kg (120 lb 9.5 oz)   12/05/17 54.9 kg (121 lb 1.6 oz)        Daily Estimated Nutrient Needs:   Energy: 1530 kcals [30-35 kcal/kg using admission body weight, 51 kg (09/20/18 1006)]  Protein: 61-77 gm [1.2-1.5 gm/kg using admission body weight, 51 kg (09/20/18 1006)]  Carbohydrate:   [no restriction]  Fluid: 1785 [35 mL/kg]     Nutrition Focused Physical Exam:                   Nutrition Evaluation  Overall Impressions: Unable to perform Nutrition-Focused Physical Exam at this time due to (comment) (09/20/18 1006)  Nutrition focused physical exam not completed in an effort to minimize contact between patient and provider due to infectious disease outbreak.    Nutrition Designation: Normal weight (BMI 18.50 - 24.99 kg/m2) (09/20/18 1006)     DIAGNOSIS:  Malnutrition Assessment using AND/ASPEN Clinical Characteristics:  pending                          Overall nutrition impression: Pt has had weight decline not significant but of concern with disease progression. She has been noted in bed most of day which she may have experienced muscle losses effecting weight status. She is currently having improve intake with treatment for her cysitis.  RD will provide milkshake nourishment based on preference request this am to help prevent further weight decline.      GOALS:  Oral Intake:       - No s/s of dehydration  Anthropometric:       - No greater than 1% weight loss in week.     RECOMMENDATIONS AND INTERVENTIONS:  Continue daily weights per unit protocol   Added super shake BID  Encourage fluid intake additional between meals  Continue regular diet  --assist menu selection and set-up meals PRN    RD Follow Up Parameters:  1-2 times per week (and more frequent as indicated)     Ed Blalock, MS, RD, CSO, LDN  Pager # [phone]

## 2018-09-20 NOTE — Unmapped (Signed)
Hi,     [name] contacted the [employer] requesting to speak with the care team of Katherine Sherman to discuss:    Patient is being discharged today. Her discharging MD wanted patient to follow up with team sooner than 10/16/18 appointment with Corrine Gaynell Face. I tried to reschedule phone appointment but she does not have slots for phone appointments and Epic will not let me schedule into a return appointment slot.    Please contact Tia at [phone].      Check Indicates criteria has been reviewed and confirmed with the patient:    []  Preferred Name   []  DOB and/or MR#  []  Preferred Contact Method  []  Phone Number(s)   []  MyChart     Thank you,   Kelli Hope  [facility]   [phone]

## 2018-09-20 NOTE — ED Triage Notes (Signed)
Patient brought in by daughter with complaints of fall today. Reports bilateral knee pain. Also reports hurting right hip and hitting the left side of face near cheek. Hx of dementia and breast cancer with mets.

## 2018-09-21 ENCOUNTER — Emergency Department (HOSPITAL_COMMUNITY): Payer: Medicare Other

## 2018-09-21 DIAGNOSIS — R1031 Right lower quadrant pain: Secondary | ICD-10-CM | POA: Diagnosis not present

## 2018-09-21 LAB — SARS CORONAVIRUS 2 BY RT PCR (HOSPITAL ORDER, PERFORMED IN ~~LOC~~ HOSPITAL LAB): SARS Coronavirus 2: NEGATIVE

## 2018-09-21 MED ORDER — DOCUSATE SODIUM 100 MG PO CAPS: 100.0000 mg | ORAL_CAPSULE | Freq: Two times a day (BID) | ORAL | 0 refills | Status: AC

## 2018-09-21 MED ORDER — OXYCODONE-ACETAMINOPHEN 5-325 MG PO TABS: 1.0000 | ORAL_TABLET | Freq: Four times a day (QID) | ORAL | 0 refills | Status: AC | PRN

## 2018-09-21 MED ORDER — OXYCODONE-ACETAMINOPHEN 5-325 MG PO TABS
1.0000 | ORAL_TABLET | Freq: Once | ORAL | Status: AC
  Administered 2018-09-21: 1 via ORAL
  Filled 2018-09-21: qty 1

## 2018-09-21 NOTE — Unmapped (Signed)
-----   Message from Jana Half sent at 09/21/2018  8:13 AM EDT -----  Regarding: FW: Referral to [facility] - Dr. Donne Hazel on Thursday, 09-21-18, at 3:00pm, arrive at 2:00pm - spoke to daughter    ----- Message -----  From: Barbaraann Share, MD  Sent: 09/20/2018   2:52 PM EDT  To: Jana Half, Bhishamjit Paris Lore, MD  Subject: RE: Referral to [facility] - Dr. Donne Hazel on#    Hi Rivka Barbara,  The team is trying to discharge right now.    Thanks!  JEssica  ----- Message -----  From: Jana Half  Sent: 09/20/2018   1:44 PM EDT  To: Alger Memos, MD, #  Subject: RE: Referral to [facility] - Dr. Donne Hazel on#    Dr. Andrey Campanile,  Katherine Sherman is scheduled to see Dr. Dorothyann Gibbs on Thursday, 09-21-18, at 3:00.  The arrival time is 2:00pm.  I called her daughter, Dewayne Hatch.  She did not know the plan was to discharge today, 09-20-18.  If that is not going to happen, let me know.  I will have to reschedule.  Thanks, Rivka Barbara  ----- Message -----  From: Barbaraann Share, MD  Sent: 09/20/2018  12:30 PM EDT  To: Jana Half, Bhishamjit Paris Lore, MD  Subject: Referral to [facility],  Can you please help this patient get a consult with Dr. Dorothyann Gibbs at [facility] ASAP? Daughter is expecting call. You can send my notes from this admission and the last follow up with Dr. Dawna Part. I believe she is getting discharged today so as soon as tomorrow or Friday is great, maybe send the discharge summary too after she leaves?    Thank you so much!  Shanda Bumps

## 2018-09-21 NOTE — ED Notes (Signed)
Patient ambulates with assistance and some pain with movement, steady gait.

## 2018-09-21 NOTE — ED Notes (Signed)
Pt ambulatory to restroom with walker 

## 2018-09-21 NOTE — ED Notes (Signed)
Pt walking without assistance and states her pain has greatly subsided and that getting up and being mobile has helped

## 2018-09-21 NOTE — ED Notes (Signed)
Patient transported to MRI via wheelchair.

## 2018-09-21 NOTE — Discharge Instructions (Signed)
You have extensive cancerous lesions in the bones of your hip/pelvis and femur. The may possibly be small fractures of your pelvis and also strain of your thigh muscle from your recent fall. Unfortunately, there is not a "quick fix" for this. This is not something that requires surgery. Use the cane of walker when you are ambulating.

## 2018-09-21 NOTE — ED Notes (Signed)
Spoke with pt daughter on the phone- states that her husband will be here shortly to pick patient up. Discharge orders/prescriptions gone over with pt and family member.

## 2018-09-21 NOTE — ED Notes (Addendum)
MRI tech has called back- will be retrieving patient around 0830. Pt aware

## 2018-09-21 NOTE — ED Provider Notes (Signed)
Assumed care in sign out with MRI pending. Multiple lytic osseous lesions and possible fracture as result for recent fall. Also strain or partal tears of adductors. Not operative injuries. Pt already has cane/walker. Will prescribe PRN pain medication and stool softener. PCP and/or oncology FU.    Virgel Manifold, MD 09/21/18 1041

## 2018-09-21 NOTE — ED Notes (Signed)
Pt remains in MRI at this time  

## 2018-09-21 NOTE — ED Provider Notes (Signed)
[facility] DEPT Provider Note   CSN: [id number] Arrival date & time: 09/20/18  2142     History   Chief Complaint Chief Complaint  Patient presents with   Fall   Knee Pain   Hip Pain    HPI Misty [name] is a 78 y.o. female.     Patient with metastatic breast cancer presenting from home after a fall.  States she went to her first radiation appointment today at [facility] when she got home she had "too much in my hands" and stumbled and fell onto her knees and struck her face.  There is no loss of consciousness.  Complains of pain to bilateral knees, right hip and groin and left face.  She denies any blood thinner use.  Denies any neck pain or back pain.  No chest pain or abdominal pain.  Denies any preceding dizziness or lightheadedness.  No fevers, chills, nausea or vomiting.  She is known to have bony metastasis.  The history is provided by the patient.  Fall Pertinent negatives include no abdominal pain, no headaches and no shortness of breath.  Knee Pain Associated symptoms: no back pain and no neck pain   Hip Pain Pertinent negatives include no abdominal pain, no headaches and no shortness of breath.    Past Medical History:  Diagnosis Date   Acute bronchitis 07/06/2008   ALLERGIC RHINITIS 02/12/2008   ANEMIA-IRON DEFICIENCY 02/12/2008   Cancer of lower-inner quadrant of left female breast ([place]) 04/23/2016   DIABETES MELLITUS, TYPE II 02/12/2008   Facial cellulitis 01/01/2014   FATIGUE 02/12/2008   HYPERLIPIDEMIA 02/12/2008   HYPERTENSION 11/05/2006   KNEE PAIN, LEFT, ACUTE 05/11/2010   LOW BACK PAIN 02/12/2008   OSTEOPENIA 02/12/2008   PEPTIC ULCER DISEASE 02/12/2008   Shingles outbreak 01/02/2014   SINUSITIS- ACUTE-NOS 10/08/2008    Patient Active Problem List   Diagnosis Date Noted   History of colon polyps 05/04/2017   Varicose veins of bilateral lower extremities with other complications 46/56/8127   Cancer of  lower-inner quadrant of left female breast ([place]) 04/23/2016   Left breast mass 03/09/2016   Pain due to varicose veins of lower extremity 03/09/2016   Constipation 09/12/2015   Diarrhea 01/04/2014   Shingles outbreak 01/02/2014   Impaired fasting glucose 01/02/2014   Hypokalemia 01/02/2014   Acute kidney injury ([place]) 01/02/2014   Facial cellulitis 01/01/2014   Whiplash injury to neck 12/24/2012   Trapezoid ligament sprain 12/24/2012   Neck pain on left side 12/24/2012   Left lumbar radiculopathy 12/22/2012   Arthralgia 11/13/2012   Preventative health care 11/07/2010   KNEE PAIN, LEFT, ACUTE 05/11/2010   Diabetes ([place]) 02/12/2008   Hyperlipidemia 02/12/2008   ANEMIA-IRON DEFICIENCY 02/12/2008   ALLERGIC RHINITIS 02/12/2008   PEPTIC ULCER DISEASE 02/12/2008   LOW BACK PAIN 02/12/2008   OSTEOPENIA 02/12/2008   FATIGUE 02/12/2008   Essential hypertension 11/05/2006    Past Surgical History:  Procedure Laterality Date   ABDOMINAL HYSTERECTOMY     APPENDECTOMY     ESOPHAGOGASTRODUODENOSCOPY  1970     OB History   No obstetric history on file.      Home Medications    Prior to Admission medications   Medication Sig Start Date End Date Taking? Authorizing Provider  amLODipine (NORVASC) 5 MG tablet TAKE 1 TABLET BY MOUTH EVERY DAY Patient taking differently: Take 5 mg by mouth daily.  06/02/18  Yes Biagio Borg, MD  ezetimibe (ZETIA) 10 MG tablet TAKE  1 TABLET (10 MG TOTAL) BY MOUTH DAILY. MUST KEEP SCHEDULED APPT FOR FUTURE REFILLS Patient taking differently: Take 10 mg by mouth daily.  01/24/18  Yes Biagio Borg, MD  letrozole [facility]) 2.5 MG tablet Take 1 tablet (2.5 mg total) by mouth daily. Patient not taking: Reported on 09/20/2018 04/28/16   Nicholas Lose, MD  valsartan-hydrochlorothiazide (DIOVAN-HCT) 320-25 MG tablet Take 1 tablet by mouth daily. Patient not taking: Reported on 09/20/2018 04/28/17   Biagio Borg, MD    Family  History Family History  Problem Relation Age of Onset   Hypertension Brother    Colon polyps Brother 35   Coronary artery disease Brother    Thyroid cancer Brother    Mental illness Other    Depression Child        manic depression   Throat cancer Other     Social History Social History   Tobacco Use   Smoking status: Former Smoker   Smokeless tobacco: Never Used  Substance Use Topics   Alcohol use: Yes    Comment: rare   Drug use: No     Allergies   Lipitor [atorvastatin calcium], Lovastatin, Simvastatin, and Tramadol   Review of Systems Review of Systems  Constitutional: Negative for activity change and appetite change.  Respiratory: Negative for cough, chest tightness and shortness of breath.   Gastrointestinal: Negative for abdominal pain, nausea and vomiting.  Genitourinary: Negative for dysuria and hematuria.  Musculoskeletal: Positive for arthralgias and myalgias. Negative for back pain and neck pain.  Neurological: Negative for dizziness, weakness, light-headedness and headaches.    all other systems are negative except as noted in the HPI and PMH.   Physical Exam Updated Vital Signs BP 139/63 (BP Location: Right Arm)    Pulse 95    Temp 98.1 F (36.7 C) (Oral)    Resp 16    Ht 5\' 3"  (1.6 m)    Wt 60.8 kg    SpO2 98%    BMI 23.74 kg/m   Physical Exam Vitals signs and nursing note reviewed.  Constitutional:      General: She is not in acute distress.    Appearance: She is well-developed.  HENT:     Head: Normocephalic and atraumatic.     Comments: No septal hematoma or hemotympanum.  Abrasion left zygoma    Mouth/Throat:     Pharynx: No oropharyngeal exudate.  Eyes:     Conjunctiva/sclera: Conjunctivae normal.     Pupils: Pupils are equal, round, and reactive to light.  Neck:     Musculoskeletal: Normal range of motion.     Comments: No C-spine tenderness or step-off. Cardiovascular:     Rate and Rhythm: Normal rate and regular  rhythm.     Heart sounds: Normal heart sounds. No murmur.  Pulmonary:     Effort: Pulmonary effort is normal. No respiratory distress.     Breath sounds: Normal breath sounds.  Abdominal:     Palpations: Abdomen is soft.     Tenderness: There is no abdominal tenderness. There is no guarding or rebound.  Musculoskeletal: Normal range of motion.        General: Tenderness present.     Comments: No T or L-spine tenderness.  Full range of motion bilateral hips and knees.  Intact DP and PT pulses.  No shortening or internal rotation.  Abrasions to the knees bilaterally.  Skin:    General: Skin is warm.     Capillary Refill: Capillary refill takes less  than 2 seconds.  Neurological:     General: No focal deficit present.     Mental Status: She is alert and oriented to person, place, and time. Mental status is at baseline.     Cranial Nerves: No cranial nerve deficit.     Motor: No abnormal muscle tone.     Coordination: Coordination normal.     Comments:  5/5 strength throughout. CN 2-12 intact.Equal grip strength.   Psychiatric:        Behavior: Behavior normal.      ED Treatments / Results  Labs (all labs ordered are listed, but only abnormal results are displayed) Labs Reviewed - No data to display  EKG None  Radiology Ct Head Wo Contrast  Result Date: 09/21/2018 CLINICAL DATA:  Fall. Recent treatment for brain tumor. History of breast cancer with metastases. EXAM: CT HEAD WITHOUT CONTRAST CT MAXILLOFACIAL WITHOUT CONTRAST CT CERVICAL SPINE WITHOUT CONTRAST TECHNIQUE: Multidetector CT imaging of the head, cervical spine, and maxillofacial structures were performed using the standard protocol without intravenous contrast. Multiplanar CT image reconstructions of the cervical spine and maxillofacial structures were also generated. COMPARISON:  Head CT 12/31/2013 FINDINGS: CT HEAD FINDINGS Brain: There is no mass, hemorrhage or extra-axial collection. The size and configuration of  the ventricles and extra-axial CSF spaces are normal. Small lacunar infarct of the left caudate head. Left cerebral white matter hypoattenuation. Vascular: No abnormal hyperdensity of the major intracranial arteries or dural venous sinuses. No intracranial atherosclerosis. Skull: The visualized skull base, calvarium and extracranial soft tissues are normal. CT MAXILLOFACIAL FINDINGS Osseous: --Complex facial fracture types: No LeFort, zygomaticomaxillary complex or nasoorbitoethmoidal fracture. --Simple fracture types: None. --Mandible: No fracture or dislocation. Orbits: The globes are intact. Normal appearance of the intra- and extraconal fat. Symmetric extraocular muscles and optic nerves. Sinuses: No fluid levels or advanced mucosal thickening. Soft tissues: Normal visualized extracranial soft tissues. CT CERVICAL SPINE FINDINGS Alignment: No static subluxation. Facets are aligned. Occipital condyles and the lateral masses of C1-C2 are aligned. Skull base and vertebrae: No acute fracture. Soft tissues and spinal canal: No prevertebral fluid or swelling. No visible canal hematoma. Disc levels: No advanced spinal canal or neural foraminal stenosis. Moderate left C2-3 facet arthrosis. Upper chest: Circumferential pleural thickening and/or fluid at the left lung apex. Other: Normal visualized paraspinal cervical soft tissues. IMPRESSION: 1. No acute intracranial abnormality. 2. No facial fracture. 3. No fracture or static subluxation of the cervical spine. Electronically Signed   By: Ulyses Jarred M.D.   On: 09/21/2018 03:37   Ct Cervical Spine Wo Contrast  Result Date: 09/21/2018 CLINICAL DATA:  Fall. Recent treatment for brain tumor. History of breast cancer with metastases. EXAM: CT HEAD WITHOUT CONTRAST CT MAXILLOFACIAL WITHOUT CONTRAST CT CERVICAL SPINE WITHOUT CONTRAST TECHNIQUE: Multidetector CT imaging of the head, cervical spine, and maxillofacial structures were performed using the standard protocol  without intravenous contrast. Multiplanar CT image reconstructions of the cervical spine and maxillofacial structures were also generated. COMPARISON:  Head CT 12/31/2013 FINDINGS: CT HEAD FINDINGS Brain: There is no mass, hemorrhage or extra-axial collection. The size and configuration of the ventricles and extra-axial CSF spaces are normal. Small lacunar infarct of the left caudate head. Left cerebral white matter hypoattenuation. Vascular: No abnormal hyperdensity of the major intracranial arteries or dural venous sinuses. No intracranial atherosclerosis. Skull: The visualized skull base, calvarium and extracranial soft tissues are normal. CT MAXILLOFACIAL FINDINGS Osseous: --Complex facial fracture types: No LeFort, zygomaticomaxillary complex or nasoorbitoethmoidal fracture. --Simple fracture  types: None. --Mandible: No fracture or dislocation. Orbits: The globes are intact. Normal appearance of the intra- and extraconal fat. Symmetric extraocular muscles and optic nerves. Sinuses: No fluid levels or advanced mucosal thickening. Soft tissues: Normal visualized extracranial soft tissues. CT CERVICAL SPINE FINDINGS Alignment: No static subluxation. Facets are aligned. Occipital condyles and the lateral masses of C1-C2 are aligned. Skull base and vertebrae: No acute fracture. Soft tissues and spinal canal: No prevertebral fluid or swelling. No visible canal hematoma. Disc levels: No advanced spinal canal or neural foraminal stenosis. Moderate left C2-3 facet arthrosis. Upper chest: Circumferential pleural thickening and/or fluid at the left lung apex. Other: Normal visualized paraspinal cervical soft tissues. IMPRESSION: 1. No acute intracranial abnormality. 2. No facial fracture. 3. No fracture or static subluxation of the cervical spine. Electronically Signed   By: Ulyses Jarred M.D.   On: 09/21/2018 03:37   Ct Femur Right Wo Contrast  Result Date: 09/21/2018 CLINICAL DATA:  78 year old female with fall and  right hip pain. EXAM: CT OF THE LOWER RIGHT EXTREMITY WITHOUT CONTRAST TECHNIQUE: Multidetector CT imaging of the right lower extremity was performed according to the standard protocol. COMPARISON:  Right femur radiograph dated 09/20/2018 FINDINGS: Evaluation is limited due to osteopenia. Bones/Joint/Cartilage There is no acute fracture or dislocation. Advanced osteopenia. 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 There is a 4 cm lucent lesion in the proximal third of the femoral diaphysis with endosteal scalloping. There is a smaller lytic lesion involving the inferior right pubic ramus (series 3 image 55). Lytic lucency of the right pubic symphysis. Findings concerning for metastatic disease. Further evaluation with bone scan recommended. Ligaments Suboptimally assessed by CT. Muscles and Tendons No for acute findings. No intramuscular hematoma. Soft tissues Extensive atherosclerotic disease of the right lower extremity vasculature. IMPRESSION: 1. No acute fracture or dislocation. 2. 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 3. Multiple lytic bone lesions concerning for metastatic disease. Correlation with history of primary malignancy and further evaluation with bone scan recommended. Electronically Signed   By: Anner Crete M.D.   On: 09/21/2018 03:36   Dg Knee Complete 4 Views Left  Result Date: 09/20/2018 CLINICAL DATA:  Initial evaluation for acute trauma, fall. EXAM: LEFT KNEE - COMPLETE 4+ VIEW COMPARISON:  None. FINDINGS: No acute fracture or dislocation. No joint effusion. Mild-to-moderate osteoarthritic changes about the knee. Mild osteopenia. No acute soft tissue abnormality. Scattered atherosclerotic calcifications noted. IMPRESSION: No acute osseous abnormality about the left knee. Electronically Signed   By: Jeannine Boga M.D.   On: 09/20/2018 22:30   Dg Knee Complete 4 Views Right  Result Date: 09/20/2018 CLINICAL DATA:  Initial evaluation for acute trauma, fall.  EXAM: RIGHT KNEE - COMPLETE 4+ VIEW COMPARISON:  None. FINDINGS: No acute fracture dislocation. No joint effusion. Mild osteoarthritic changes about the knee. Osteopenia. No acute soft tissue abnormality. Scattered atherosclerotic calcifications noted. IMPRESSION: No acute osseous abnormality about the right knee. Electronically Signed   By: Jeannine Boga M.D.   On: 09/20/2018 22:32   Dg Hip Unilat  With Pelvis 2-3 Views Right  Result Date: 09/20/2018 CLINICAL DATA:  Initial evaluation for acute trauma, fall. EXAM: DG HIP (WITH OR WITHOUT PELVIS) 2-3V RIGHT COMPARISON:  None available. FINDINGS: No acute fracture dislocation. Femoral head in normal alignment within the acetabulum. Femoral head height maintained. Bony pelvis intact. Limited views of the left hip demonstrate no acute finding. Moderate osteoarthritic changes about the hips bilaterally, right worse than left. Degenerative changes noted within  lower lumbar spine. 3.6 cm lucent lesion with narrow zone of transition seen within the proximal right femoral shaft, indeterminate. No visible soft tissue abnormality. Atherosclerotic change noted. Large volume retained stool within the colon. IMPRESSION: 1. No acute osseous abnormality about the right hip. 2. 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 3. 3.6 cm lucent lesion within the proximal right femoral shaft, indeterminate. Correlation with bone scan recommended for further characterization. Additionally, further evaluation with MRI could also be performed as warranted. Electronically Signed   By: Jeannine Boga M.D.   On: 09/20/2018 22:28   Ct Maxillofacial Wo Contrast  Result Date: 09/21/2018 CLINICAL DATA:  Fall. Recent treatment for brain tumor. History of breast cancer with metastases. EXAM: CT HEAD WITHOUT CONTRAST CT MAXILLOFACIAL WITHOUT CONTRAST CT CERVICAL SPINE WITHOUT CONTRAST TECHNIQUE: Multidetector CT imaging of the head,  cervical spine, and maxillofacial structures were performed using the standard protocol without intravenous contrast. Multiplanar CT image reconstructions of the cervical spine and maxillofacial structures were also generated. COMPARISON:  Head CT 12/31/2013 FINDINGS: CT HEAD FINDINGS Brain: There is no mass, hemorrhage or extra-axial collection. The size and configuration of the ventricles and extra-axial CSF spaces are normal. Small lacunar infarct of the left caudate head. Left cerebral white matter hypoattenuation. Vascular: No abnormal hyperdensity of the major intracranial arteries or dural venous sinuses. No intracranial atherosclerosis. Skull: The visualized skull base, calvarium and extracranial soft tissues are normal. CT MAXILLOFACIAL FINDINGS Osseous: --Complex facial fracture types: No LeFort, zygomaticomaxillary complex or nasoorbitoethmoidal fracture. --Simple fracture types: None. --Mandible: No fracture or dislocation. Orbits: The globes are intact. Normal appearance of the intra- and extraconal fat. Symmetric extraocular muscles and optic nerves. Sinuses: No fluid levels or advanced mucosal thickening. Soft tissues: Normal visualized extracranial soft tissues. CT CERVICAL SPINE FINDINGS Alignment: No static subluxation. Facets are aligned. Occipital condyles and the lateral masses of C1-C2 are aligned. Skull base and vertebrae: No acute fracture. Soft tissues and spinal canal: No prevertebral fluid or swelling. No visible canal hematoma. Disc levels: No advanced spinal canal or neural foraminal stenosis. Moderate left C2-3 facet arthrosis. Upper chest: Circumferential pleural thickening and/or fluid at the left lung apex. Other: Normal visualized paraspinal cervical soft tissues. IMPRESSION: 1. No acute intracranial abnormality. 2. No facial fracture. 3. No fracture or static subluxation of the cervical spine. Electronically Signed   By: Ulyses Jarred M.D.   On: 09/21/2018 03:37     Procedures Procedures (including critical care time)  Medications Ordered in ED Medications - No data to display   Initial Impression / Assessment and Plan / ED Course  I have reviewed the triage vital signs and the nursing notes.  Pertinent labs & imaging results that were available during my care of the patient were reviewed by me and considered in my medical decision making (see chart for details).       Patient metastatic breast cancer here with knee pain, hip pain after a fall.  Also struck her face.  No blood thinner use. Patient has known bony metastatic lesions throughout her pelvis, right femur and other areas. She denies any preceding dizziness or lightheadedness.  She states his fall was due to having too much her hands. No chest pain or shortness of breath.  X-rays show no fracture.  Does show a lucent lesion of the right femoral shaft which was seen on patient's recent bone scan on June 19  CT scan shows no acute fracture. CT head, neck and face  are negative.  Patient complains of pain in her right groin with ambulation.  She states this is a new pain worse than normal.  Patient complains of right groin pain with attempted ambulation with assistance and with a walker.  States he does use a cane and walker at home as needed.  Has new pain in her groin that was not there previously.  Concern for possible occult fracture.  MRI not available at this facility.  Patient will be transferred for MRI.  Discussed with Dr. Betsey Holiday who accepts to [facility] ED. Final Clinical Impressions(s) / ED Diagnoses   Final diagnoses:  Fall, initial encounter  Right groin pain  Metastatic cancer [facility])    ED Discharge Orders    None       Cambell Stanek, Annie Main, MD 09/21/18 [phone]

## 2018-09-22 NOTE — Unmapped (Signed)
Physician Discharge Summary [facility]  4 ONC [facility]  [address]  [place] [address]  Dept: [phone]  Loc: [phone]     Identifying Information:   Katherine Sherman  1941/01/02  [id number]    Primary Care Physician: Corwin Levins, MD     Referring Physician: Referred Self     Code Status: Full Code    Admit Date: 09/18/2018    Discharge Date: 09/20/2018     Discharge To: Home    Discharge Service: MDE - Solid Tumor     Discharge Attending Physician: No att. providers found    Discharge Diagnoses:  Principal Problem:    Cystitis  Active Problems:    Malignant neoplasm of left breast in female, estrogen receptor positive (CMS-HCC)    Essential hypertension    Malignant neoplasm of overlapping sites of lung (CMS-HCC)    Dementia without behavioral disturbance (CMS-HCC)    Malignant neoplasm of hilus of left lung (CMS-HCC)    Thyroid dysfunction    Failure to thrive in adult  Resolved Problems:    * No resolved hospital problems. *      Outpatient Provider Follow Up Issues:   Follow-up Plan after discharge:  1. Issues related to hospitalization: Decompensation related to breast cancer  2. Follow-up appointment for lab draws: none  3. Follow-up appointment with [facility]: Radiation Oncology at [facility] and dr. Archie Balboa   4. Oncology specific plans going forward: Radiation     Patient's primary oncologist and/or nurse navigator: Dr. Corwin Levins   Warm handoff via Epic message or direct conversation?: Yes.    Hospital Course:   Katherine Sherman is a 78 y.o. 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 She was started on cefdinir to complete a 5 day total course (6/23 - 6/27).    Procedures:  None  No admission procedures for hospital encounter. ______________________________________________________________________  Discharge Medications:     Your Medication List      STOP taking these medications    palbociclib 75 mg Tab        START taking these medications    cefdinir 300 MG capsule  Commonly known as:  OMNICEF  Take 1 capsule (300 mg total) by mouth every twelve (12) hours for 3 days.        CONTINUE taking these medications    amLODIPine 5 MG tablet  Commonly known as:  NORVASC  Take 5 mg by mouth daily.     benzonatate 100 MG capsule  Commonly known as:  TESSALON PERLES  Take 1 capsule (100 mg total) by mouth Three (3) times a day as needed for cough.     ezetimibe 10 mg tablet  Commonly known as:  ZETIA  Take 10 mg by mouth daily.     fluticasone propionate 50 mcg/actuation nasal spray  Commonly known as:  FLONASE  1 spray by Each Nare route daily.     ibuprofen 200 MG tablet  Commonly known as:  ADVIL,MOTRIN  Take 200 mg by mouth daily as needed for pain.     letrozole 2.5 mg tablet  Commonly known as:  FEMARA  Take 1 tablet (2.5 mg total) by mouth daily.            Allergies:  Patient has no known allergies.  ______________________________________________________________________  Pending Test Results (if blank,  then none):      Most Recent Labs:  All lab results last 24 hours - No results found for this or any previous visit (from the past 24 hour(s)).    Relevant Studies/Radiology (if blank, then none):  Mri Brain W Wo Contrast    Addendum Date: 09/20/2018    ==================== ADDENDUM (09/20/2018 10:22 AM): On review, the following additional findings were noted: 0.5 cm enhancing left cerebellar hemisphere metastatic lesion, also new since 2018 (12:142).    Result Date: 09/20/2018  EXAM: Magnetic resonance imaging, brain, without and with contrast material. DATE: 09/20/2018 9:12 AM ACCESSION: [id number] UN DICTATED: 09/20/2018 9:15 AM INTERPRETATION LOCATION: Main Campus CLINICAL INDICATION: 78 years old Female with concern for mets  COMPARISON: 06/23/2016 MRI brain. TECHNIQUE: Multiplanar, multisequence MR imaging of the brain was performed without and with I.V. contrast. FINDINGS:  New enhancing 0.8 cm midline occipital calvarial lesion measuring 0.8 cm (12:130). Diffuse cerebral volume loss with ex vacuo dilatation. Redemonstration of numerous dilated perivascular spaces. Interval increase in scattered and confluent T2 hyperintense foci within the periventricular and deep white matter which are nonspecific however commonly associated with chronic small vessel ischemic changes, but can also be related to vasculitis which can be drug related. No diffusion weighted signal abnormality. No intracranial hemorrhage or acute infarct. No midline shift. No extra-axial fluid collections.     Enhancing 0.8 cm midline occipital calvarial lesion is new since 2018. Attention on follow-up is recommended. Diffuse cerebral volume loss. Increased chronic small vessels ischemic changes, consider MRA with contrast, vasculitis protocol if indicated.     ______________________________________________________________________  Discharge Instructions:     Activity Instructions     Activity as tolerated                Other Instructions     Call MD for:  difficulty breathing, headache or visual disturbances      Call MD for:  persistent nausea or vomiting      Call MD for:  severe uncontrolled pain      Call MD for:  temperature >38.5 Celsius      Discharge instructions      You were admitted to [facility] with urinary tract infection and deconditioning. You were found during your hospitalization to have new metastasis in your brain from your cancer. To treat this, we are going to send you to [facility] for radiation therapy (6/25 at 2:00 pm). With regard to your UTI, continue your antibiotics (cefdinir/ Omnicef) twice a day for the next three days starting 6/25.     Please take your medications as prescribed. Medication changes are listed below and a full list of medications is in this discharge packet. Please keep your follow-up appointments after the hospital for ongoing care. It has been a pleasure taking care of you, we wish you the best.     MEDICATION CHANGES:  -- New prescription for cefdinir    FOLLOW-UP:  -- Radiation oncology at [facility] on 6/25 @ 2:00PM               Follow Up instructions and Outpatient Referrals     Ambulatory referral to Home Health      Is this a [facility] referral?:  No    Physician to follow patient's care:  PCP    Disciplines requested:   Physical Therapy  Occupational Therapy  Home Health Aide       Physical Therapy requested:  Evaluate and treat  Occupational Therapy Requested:  Evaluate and treat    Requested start of care date:  Routine (within 48 hours)    Call MD for:  difficulty breathing, headache or visual disturbances      Call MD for:  persistent nausea or vomiting      Call MD for:  severe uncontrolled pain      Call MD for:  temperature >38.5 Celsius      Discharge instructions            Appointments which have been scheduled for you    Oct 16, 2018 12:30 PM EDT  (Arrive by 12:00 PM)  PHONE with Corrine Tasia Catchings, AGNP  [place] [facility]AT[facility] 2ND FLR CANCER HOSP [facility] REG[facility]) [address]  [place] [address]  [phone]   Please DO NOT come to the clinic for this visit. We will call you to discuss your plan of care.      Nov 20, 2018  1:00 PM EDT  (Arrive by 12:30 PM)  PHONE with Olivia Mackie, MD  [facility] HEMATOLOGY ONCOLOGY 2ND FLR CANCER HOSP [facility] REG[facility]) [address]  [place] [address]  [phone]   Please DO NOT come to the clinic for this visit. We will call you to discuss your plan of care.       Additional instructions:      You have a radiation oncology appointment on 09/21/2018 at 2PM with Dr. Dorothyann Gibbs.  Please give them a call at [phone] if you need to reschedule this appointment.    [facility] [address] -- First Floor  [place], [place] [address]                ______________________________________________________________________  Discharge Day Services:  BP 129/77  - Pulse 94  - Temp 36.7 ??C (Oral)  - Resp 18  - Ht 157.5 cm (5' 2)  - Wt 51 kg (112 lb 6.4 oz)  - SpO2 95%  - BMI 20.56 kg/m??   Pt seen on the day of discharge and determined appropriate for discharge.    Condition at Discharge: good    Length of Discharge: I spent less than 30 mins in the discharge of this patient.

## 2018-09-25 NOTE — Unmapped (Signed)
Hi,     Katherine Sherman, patient's daughter contacted the Communication Center regarding the following:    - Requesting to speak with NN regarding radiologist referred patient to orthopedic surgery. Katherine Sherman says they want to put a rod in her femur but I don't know if that's a good idea. There is a tumor in my mom's bone bone.    Please contact Katherine Sherman at [phone].    Thanks in advance,    Katherine Sherman  [facility] Communication Center   [phone]

## 2018-09-25 NOTE — Unmapped (Signed)
[facility] Triage Note     Patient: Katherine Sherman     Reason for call:  return call    Time call returned: [phone]     Phone Assessment: Amy states that pt fell when she got home on Wednesday and she spent several hours at [facility] and they referred her to orthopedist and they are speaking of placing a rod in her leg - they want to do this NEXT Tuesday. Amy states that she needs some assistance and some direction.       Triage Recommendations: RN will route message to Dr. Archie Balboa.  Maybe a palliative care consult would be a good idea?  Will ask Dr. Archie Balboa.     Caller Response: appreciation     Outstanding tasks: forward message to team

## 2018-09-25 NOTE — Unmapped (Signed)
Call made to patient's daughter, Amy, to discuss concerns re: radiation/surgery. She states that they were referred to Dr. Orlene Plum for radiation, who then referred the patient to Dr. Chase Caller. Dr. Ezekiel Ina is an orthopedic physician who Amy states is insisting that the patient needs a procedure to place a rod in the patient's femur. Amy does not feel that this is appropriate and does not want the patient to proceed with this plan. Informed her that Dr. Archie Balboa does not recommend proceeding with surgery. Amy provided phone numbers for both the radiation and orthopedic office, listed below, and asked if Dr. Archie Balboa would be in touch w/ the physicians. Informed her that Dr. Archie Balboa intends to reach out to them tomorrow in order to devise a plan. She verbalized understanding.    Orlene Plum: [phone]  Chase Caller: [phone]

## 2018-09-26 NOTE — Unmapped (Signed)
Spoke w/ Dr. Archie Balboa during weekly phone call regarding needing orders. He was comfortable w/ these orders being placed verbally.    Attempted to contact Jennette Kettle w/ [facility]. He answered and hung up before speaking.

## 2018-09-26 NOTE — Unmapped (Signed)
Katherine Sherman contacted the [employer] requesting to speak with the care team of [name] to discuss:    Discuss plan of care for patient.    Please contact Omelia Blackwater at [phone].    Program:   Speciality:     Check Indicates criteria has been reviewed and confirmed with the patient:    []  Preferred Name   [x]  DOB and/or MR#  [x]  Preferred Contact Method  [x]  Phone Number(s)   []  MyChart     Thank you,   Christell Faith  [facility] Communication Center   [phone]

## 2018-09-26 NOTE — Unmapped (Signed)
Hi,   ??  Luther with [facility] contacted the Communication Center requesting to speak with the care team of [name] to discuss:  ??  Returnign call from [place]. They need orders for the patient.  ??  Please contact Omelia Blackwater at [phone].  ??  Program:   Speciality:   ??  Check Indicates criteria has been reviewed and confirmed with the patient:  ??  [] ? Preferred Name   [x] ? DOB and/or MR#  [x] ? Preferred Contact Method  [x] ? Phone Number(s)   [] ? MyChart   ??  Thank you,   Christell Faith  [facility] Communication Center   [phone]

## 2018-09-26 NOTE — Unmapped (Signed)
[facility] Triage Note     Patient: Katherine Sherman     Reason for call:  return call    Time call returned: 1303     Phone Assessment: Jennette Kettle with [facility] is calling to ask for homecare orders from Dr. Archie Balboa.  He says the PCP who has been signing the orders up until now is no longer seeing the pt at the daughter's (Amy's) request.      Jennette Kettle is asking for RN 1/wk for two weeks and referral to their SW so that they can work on a better POC.  He is also asking if we can put in an order for a BSC.    He is also thankful that Dr. Archie Balboa does not feel it would be in the best interest of the pt to have the recommended surgery.     Triage Recommendations: RN will consult team and someone will call him back.     Caller Response: appreciation     Outstanding tasks: consult team

## 2018-10-02 NOTE — Unmapped (Signed)
Clinical Child psychotherapist at the [employer] (outpatient):     SW received Epic inbasket message from Dr. Kerrin Champagne of [facility] heme oncology requesting information/assistance with patient's FL2.  Patient's daughter Linton Rump has requested the FL2 in pursuit of nursing home placement as [caring for patient is] becoming too much for [place] to handle.    SW provided Dr. Archie Balboa and RN Dan Maker with information about the FL2. Provided form and instructions via email. Offered ongoing assistance as needed.

## 2018-10-04 NOTE — Unmapped (Signed)
Clinical Social Worker at the [facility] (outpatient):     Consultation with Dr. Kerrin Champagne and his treatment team regarding patient's daughter Amy's request for an FL2.  After discussion, Dr. Archie Balboa decided that the form would be best completed by patient's PCP, Murrell Redden, MD.  This clinic hasn't seen patient since 12/29/2017.  SW sent [facility] Chart message to patient (daughter Amy) explaining the above and providing the FL2 form.  Offered to answer any questions Amy may have.

## 2018-10-05 ENCOUNTER — Telehealth: Payer: Self-pay | Admitting: Internal Medicine

## 2018-10-05 NOTE — Telephone Encounter (Signed)
Yes, OV would be appropriate

## 2018-10-05 NOTE — Telephone Encounter (Signed)
I received a FL2 to be completed, I have reached out to her daughter Amy to ask a few questions.  Dr.John patient was in the ER on 6/24. She has not made a ER FU.  LOV: 06/01/18  Would you like patient to set up an appointment, it has been over 90 since LOV, Please advise?

## 2018-10-06 NOTE — Telephone Encounter (Signed)
LVM on patient &daughter's VM information we need to sent up a hospital fu/FL2 appointment.

## 2018-10-11 NOTE — Unmapped (Signed)
Hi Dr. Archie Balboa,    Dr. Michell Heinrich with [facility] Radiation oncology has contacted the Communication Center requesting to speak with you directly regarding the following:    Care plan of mutual patient    Dr. Michell Heinrich is available for a call back anytime.  The best number to call back is [phone]    A page has also been sent.    Thank you,  Vernie Ammons  [facility] Communication Center  [phone]

## 2018-10-11 NOTE — Unmapped (Signed)
*  LEFT MESSAGE ON SON'S VMAIL ABOUT INJECTION APPT ON 7.20/DAUGHTER'S VMAIL FULL

## 2018-10-11 NOTE — Unmapped (Signed)
Attempted to contact daughter to discuss when patient will be done with radiation. She did not answer at phone number on file. This RN to send a [employer].

## 2018-10-11 NOTE — Unmapped (Signed)
-----   Message from Corrine Tasia Catchings, [place] sent at 10/10/2018  3:58 PM EDT -----  Phylis Bougie,    Can you call this patient daughter to see when she is planning to be done with RT? It looks like per care everywhere she is still getting RT. She is scheduled for phone visit on 7/20 with me, but need to see when she will be done with RT so we can start fulvestrant.    Thanks!  Corrine

## 2018-10-11 NOTE — Telephone Encounter (Signed)
I have faxed the forms back to the office that requested it. To inform, we need an appointment &Have been unable to reach patient or daughter.

## 2018-10-13 NOTE — Unmapped (Signed)
I spoke with the patient's daughter via phone.  I was called by Dr. Michell Heinrich earlier this week who notified me that the patient was hospitalized for pubic ramus fracture.  According to Katherine Sherman she was walking up the steps and she heard a pop after day 2 of radiation.  Katherine Sherman then developed worsening pain and was hospitalized.  Katherine Sherman has been declining.  Explained that hospice is not unreasonable option.  She is planning on taking her home with past palliative care and hospice.  Will cancel appointment on Monday 7/20.  Tentatively scheduled 8/24 to f/u on outcome and BSC.

## 2018-10-13 NOTE — Unmapped (Signed)
Hi,     Amy with [facility] contacted the Communication Center requesting to speak with the care team of Katherine Sherman to discuss:    Will Dr. Archie Balboa would be the attending of record for hospice.    Please contact at [phone].    Program: Breast  Speciality: Medical Oncology    Check Indicates criteria has been reviewed and confirmed with the patient:    []  Preferred Name   [x]  DOB and/or MR#  [x]  Preferred Contact Method  [x]  Phone Number(s)   []  MyChart     Thank you,   Vernie Ammons  [facility] Communication Center   [phone]

## 2018-10-14 MED ORDER — HEPARIN SODIUM (PORCINE) 5000 UNIT/ML IJ SOLN
5000.00 | INTRAMUSCULAR | Status: DC
Start: 2018-10-14 — End: 2018-10-14

## 2018-10-14 MED ORDER — HYDROXYZINE HCL 25 MG PO TABS
25.00 | ORAL_TABLET | ORAL | Status: DC
Start: ? — End: 2018-10-14

## 2018-10-14 MED ORDER — HYDROCODONE-ACETAMINOPHEN 5-325 MG PO TABS
1.00 | ORAL_TABLET | ORAL | Status: DC
Start: ? — End: 2018-10-14

## 2018-10-14 MED ORDER — SORBITOL 70 % PO SOLN
30.00 | ORAL | Status: DC
Start: ? — End: 2018-10-14

## 2018-10-14 MED ORDER — ONDANSETRON 4 MG PO TBDP
4.00 | ORAL_TABLET | ORAL | Status: DC
Start: ? — End: 2018-10-14

## 2018-10-14 MED ORDER — EZETIMIBE 10 MG PO TABS
10.00 | ORAL_TABLET | ORAL | Status: DC
Start: 2018-10-14 — End: 2018-10-14

## 2018-10-14 MED ORDER — ONDANSETRON HCL 4 MG/2ML IJ SOLN
4.00 | INTRAMUSCULAR | Status: DC
Start: ? — End: 2018-10-14

## 2018-10-14 MED ORDER — TEMAZEPAM 15 MG PO CAPS
15.00 | ORAL_CAPSULE | ORAL | Status: DC
Start: ? — End: 2018-10-14

## 2018-10-14 MED ORDER — ALUMINUM-MAGNESIUM-SIMETHICONE 200-200-20 MG/5ML PO SUSP
30.00 | ORAL | Status: DC
Start: ? — End: 2018-10-14

## 2018-10-14 MED ORDER — ACETAMINOPHEN 325 MG PO TABS
650.00 | ORAL_TABLET | ORAL | Status: DC
Start: 2018-10-14 — End: 2018-10-14

## 2018-10-14 MED ORDER — BISACODYL 5 MG PO TBEC
10.00 | DELAYED_RELEASE_TABLET | ORAL | Status: DC
Start: ? — End: 2018-10-14

## 2018-10-14 MED ORDER — FLUTICASONE PROPIONATE 50 MCG/ACT NA SUSP
1.00 | NASAL | Status: DC
Start: 2018-10-15 — End: 2018-10-14

## 2018-10-14 MED ORDER — CLONIDINE HCL 0.1 MG PO TABS
0.10 | ORAL_TABLET | ORAL | Status: DC
Start: ? — End: 2018-10-14

## 2018-10-14 MED ORDER — OXYCODONE HCL 5 MG PO TABS
5.00 | ORAL_TABLET | ORAL | Status: DC
Start: ? — End: 2018-10-14

## 2018-10-14 MED ORDER — AMLODIPINE BESYLATE 5 MG PO TABS
5.00 | ORAL_TABLET | ORAL | Status: DC
Start: 2018-10-15 — End: 2018-10-14

## 2018-10-14 MED ORDER — GUAIFENESIN-DM 100-10 MG/5ML PO SYRP
10.00 | ORAL_SOLUTION | ORAL | Status: DC
Start: ? — End: 2018-10-14

## 2018-10-14 MED ORDER — ACETAMINOPHEN 325 MG PO TABS
325.00 | ORAL_TABLET | ORAL | Status: DC
Start: ? — End: 2018-10-14

## 2018-10-14 MED ORDER — HYDROCORTISONE 1 % EX CREA
1.00 | TOPICAL_CREAM | CUTANEOUS | Status: DC
Start: ? — End: 2018-10-14

## 2018-10-14 MED ORDER — DOCUSATE SODIUM 100 MG PO CAPS
100.00 | ORAL_CAPSULE | ORAL | Status: DC
Start: ? — End: 2018-10-14

## 2018-10-16 NOTE — Unmapped (Signed)
[facility] Triage Note     Patient: Katherine Sherman     Reason for call:  return call    Time call returned: 7/20 1036     Returning call to [facility] regarding attending hospice physician of record.  Dr. Archie Balboa has indicated that he would prefer them to ask the patient's PCP to take on this role for the patient.     Amy indicated that the pt did not sign up with them after all.  RN is not sure if pt signed with another agency.

## 2018-10-17 ENCOUNTER — Telehealth: Payer: Self-pay

## 2018-10-17 NOTE — Telephone Encounter (Signed)
Informed Amy that PCP will be the attending.   Copied from Clint [phone]. Topic: General - Other >> Oct 17, 2018  1:49 PM Yvette Rack wrote: Reason for CRM: Amy with [facility] called to see if Dr. Jenny Reichmann would be attending provider for pt hospice. Cb# [phone]

## 2018-10-19 ENCOUNTER — Telehealth: Payer: Self-pay | Admitting: Emergency Medicine

## 2018-10-19 NOTE — Telephone Encounter (Signed)
Pt has appt tomorrow for completed of FL2 form. Received form and given to Shirron to hold until appt time.

## 2018-10-20 ENCOUNTER — Ambulatory Visit (INDEPENDENT_AMBULATORY_CARE_PROVIDER_SITE_OTHER): Payer: Medicare Other | Admitting: Internal Medicine

## 2018-10-20 DIAGNOSIS — E785 Hyperlipidemia, unspecified: Secondary | ICD-10-CM

## 2018-10-20 DIAGNOSIS — R7301 Impaired fasting glucose: Secondary | ICD-10-CM

## 2018-10-20 DIAGNOSIS — C50312 Malignant neoplasm of lower-inner quadrant of left female breast: Secondary | ICD-10-CM | POA: Diagnosis not present

## 2018-10-20 DIAGNOSIS — Z170 Estrogen receptor positive status [ER+]: Secondary | ICD-10-CM | POA: Diagnosis not present

## 2018-10-20 NOTE — Progress Notes (Signed)
Patient ID: Misty [name], female   DOB: [birth year], 78 y.o.   MRN: [id number]  Virtual Visit via Video Note  I connected with [name] on 10/21/18 at 10:40 AM EDT by a video enabled telemedicine application and verified that I am speaking with the correct person using two identifiers.  Location: Patient: at home with daughter who gives hx Provider: at office   I discussed the limitations of evaluation and management by telemedicine and the availability of in person appointments. The patient expressed understanding and agreed to proceed.  History of Present Illness: Here for wellness and f/u;  Overall doing ok;  Pt denies Chest pain, worsening SOB, DOE, wheezing, orthopnea, PND, worsening LE edema, palpitations, dizziness or syncope.  Pt denies neurological change such as new headache, facial or extremity weakness.  Pt denies polydipsia, polyuria, or low sugar symptoms. Pt states overall good compliance with treatment and medications, good tolerability, and has been trying to follow appropriate diet.  Pt denies worsening depressive symptoms, suicidal ideation or panic. No fever, night sweats, wt loss, loss of appetite, or other constitutional symptoms.  Pt states good ability with ADL's, has low fall risk, home safety reviewed and adequate, no other significant changes in hearing or vision.  Unfortunately has metastatic cancer with recent marked deterioration, followed by [facility] oncology.  Daughter asks of zetia can be stopped. Has had recent worsening weakness and several falls. Past Medical History:  Diagnosis Date  . Acute bronchitis 07/06/2008  . ALLERGIC RHINITIS 02/12/2008  . ANEMIA-IRON DEFICIENCY 02/12/2008  . Cancer of lower-inner quadrant of left female breast ([place]) 04/23/2016  . DIABETES MELLITUS, TYPE II 02/12/2008  . Facial cellulitis 01/01/2014  . FATIGUE 02/12/2008  . HYPERLIPIDEMIA 02/12/2008  . HYPERTENSION 11/05/2006  . KNEE PAIN, LEFT, ACUTE 05/11/2010  . LOW BACK PAIN  02/12/2008  . OSTEOPENIA 02/12/2008  . PEPTIC ULCER DISEASE 02/12/2008  . Shingles outbreak 01/02/2014  . SINUSITIS- ACUTE-NOS 10/08/2008   Past Surgical History:  Procedure Laterality Date  . ABDOMINAL HYSTERECTOMY    . APPENDECTOMY    . ESOPHAGOGASTRODUODENOSCOPY  1970    reports that she has quit smoking. She has never used smokeless tobacco. She reports current alcohol use. She reports that she does not use drugs. family history includes Colon polyps (age of onset: 40) in her brother; Coronary artery disease in her brother; Depression in her child; Hypertension in her brother; Mental illness in an other family member; Throat cancer in an other family member; Thyroid cancer in her brother. Allergies  Allergen Reactions  . Lipitor [Atorvastatin Calcium] Nausea Only  . Lovastatin Nausea Only  . Simvastatin Other (See Comments)    gi upset  . Tramadol Other (See Comments)    sleepiness   Current Outpatient Medications on File Prior to Visit  Medication Sig Dispense Refill  . amLODipine (NORVASC) 5 MG tablet TAKE 1 TABLET BY MOUTH EVERY DAY (Patient taking differently: Take 5 mg by mouth daily. ) 90 tablet 3  . docusate sodium (COLACE) 100 MG capsule Take 1 capsule (100 mg total) by mouth every 12 (twelve) hours. 60 capsule 0  . ezetimibe (ZETIA) 10 MG tablet TAKE 1 TABLET (10 MG TOTAL) BY MOUTH DAILY. MUST KEEP SCHEDULED APPT FOR FUTURE REFILLS (Patient taking differently: Take 10 mg by mouth daily. ) 30 tablet 5  . letrozole (FEMARA) 2.5 MG tablet Take 1 tablet (2.5 mg total) by mouth daily. (Patient not taking: Reported on 09/20/2018) 90 tablet 3  . oxyCODONE-acetaminophen (PERCOCET/ROXICET) 5-325  MG tablet Take 1-2 tablets by mouth every 6 (six) hours as needed for severe pain. 30 tablet 0  . valsartan-hydrochlorothiazide (DIOVAN-HCT) 320-25 MG tablet Take 1 tablet by mouth daily. (Patient not taking: Reported on 09/20/2018) 90 tablet 1   No current facility-administered medications on  file prior to visit.     Observations/Objective: Alert, NAD, appropriate mood and affect, resps normal, cn 2-12 intact, moves all 4s, no visible rash or swelling Lab Results  Component Value Date   WBC 9.3 09/12/2015   HGB 12.2 09/12/2015   HCT 36.5 09/12/2015   PLT 316.0 09/12/2015   GLUCOSE 112 (H) 05/03/2017   CHOL 159 05/03/2017   TRIG 187.0 (H) 05/03/2017   HDL 42.50 05/03/2017   LDLDIRECT 165.0 09/12/2015   LDLCALC 80 05/03/2017   ALT 12 05/03/2017   AST 14 05/03/2017   NA 139 05/03/2017   K 3.8 05/03/2017   CL 100 05/03/2017   CREATININE 0.89 05/03/2017   BUN 25 (H) 05/03/2017   CO2 30 05/03/2017   TSH 1.52 05/03/2017   HGBA1C 6.3 03/09/2016   MICROALBUR 1.1 09/12/2015   Assessment and Plan: See notes  Follow Up Instructions: See notes   I discussed the assessment and treatment plan with the patient. The patient was provided an opportunity to ask questions and all were answered. The patient agreed with the plan and demonstrated an understanding of the instructions.   The patient was advised to call back or seek an in-person evaluation if the symptoms worsen or if the condition fails to improve as anticipated.   Cathlean Cower, MD

## 2018-10-20 NOTE — Telephone Encounter (Signed)
Amy stated the FL2 needs to also state the patient would benefit from having PT and OT at the facility.

## 2018-10-20 NOTE — Telephone Encounter (Signed)
Need to speak to nurse to give additional information to complete the FL2. Please call

## 2018-10-21 ENCOUNTER — Encounter: Payer: Self-pay | Admitting: Internal Medicine

## 2018-10-21 NOTE — Assessment & Plan Note (Signed)
Ok to stop the zetia,  to f/u any worsening symptoms or concerns

## 2018-10-21 NOTE — Assessment & Plan Note (Signed)
stable overall by history and exam, recent data reviewed with pt, and pt to continue medical treatment as before,  to f/u any worsening symptoms or concerns  

## 2018-10-21 NOTE — Assessment & Plan Note (Signed)
Cont f/u [facility] oncology, may need hospice soon

## 2018-10-21 NOTE — Patient Instructions (Signed)
Ok to stop the zetia  The FL2 form to be filled out  Please continue all other medications as before, and refills have been done if requested.  Please have the pharmacy call with any other refills you may need.  Please keep your appointments with your specialists as you may have planned

## 2018-11-06 ENCOUNTER — Other Ambulatory Visit: Payer: Self-pay

## 2018-11-06 ENCOUNTER — Emergency Department (HOSPITAL_COMMUNITY)
Admission: EM | Admit: 2018-11-06 | Discharge: 2018-11-06 | Disposition: A | Discharge status: Home / Self Care | Attending: Emergency Medicine | Admitting: Emergency Medicine

## 2018-11-06 DIAGNOSIS — E119 Type 2 diabetes mellitus without complications: Secondary | ICD-10-CM | POA: Diagnosis not present

## 2018-11-06 DIAGNOSIS — R51 Headache: Secondary | ICD-10-CM | POA: Insufficient documentation

## 2018-11-06 DIAGNOSIS — M542 Cervicalgia: Secondary | ICD-10-CM | POA: Insufficient documentation

## 2018-11-06 DIAGNOSIS — R630 Anorexia: Secondary | ICD-10-CM | POA: Diagnosis not present

## 2018-11-06 DIAGNOSIS — G309 Alzheimer's disease, unspecified: Secondary | ICD-10-CM | POA: Insufficient documentation

## 2018-11-06 DIAGNOSIS — R1013 Epigastric pain: Secondary | ICD-10-CM | POA: Insufficient documentation

## 2018-11-06 DIAGNOSIS — I10 Essential (primary) hypertension: Secondary | ICD-10-CM | POA: Diagnosis not present

## 2018-11-06 DIAGNOSIS — Z87891 Personal history of nicotine dependence: Secondary | ICD-10-CM | POA: Insufficient documentation

## 2018-11-06 DIAGNOSIS — Z853 Personal history of malignant neoplasm of breast: Secondary | ICD-10-CM | POA: Insufficient documentation

## 2018-11-06 NOTE — ED Provider Notes (Signed)
[facility] [place] EMERGENCY DEPARTMENT Provider Note   CSN: [id number] Arrival date & time: 11/06/18  1552    History   Chief Complaint Chief Complaint  Patient presents with  . Abdominal Pain    HPI Misty [name] is a 78 y.o. female with history of Alzheimer dementia and metastatic breast cancer with metastases to the bone and brain presenting to the ED via EMS complaining of abdominal pain this afternoon.  Patient is on home hospice and is currently not undergoing treatment for her cancer.  On arrival to the ED, patient denies having any pain anywhere and states that she feels well.  Spoke with patient's daughter, who reports that patient had onset of severe epigastric abdominal pain after eating cereal this afternoon.  Per EMS, patient belched and then had resolution of her pain.  Daughter reports patient was also complaining of severe neck pain and headache earlier today, which patient denies upon arrival to the ED.  Daughter reports giving patient her dose of home oxycodone approximately 30 minutes prior to calling EMS.  Daughter reports patient has had decreased appetite but is otherwise been well and has had no fever, chills, cough, shortness of breath, vomiting, or any other complaints.     The history is provided by the patient, a relative and the EMS personnel. The history is limited by the condition of the patient.    Past Medical History:  Diagnosis Date  . Acute bronchitis 07/06/2008  . ALLERGIC RHINITIS 02/12/2008  . ANEMIA-IRON DEFICIENCY 02/12/2008  . Cancer of lower-inner quadrant of left female breast ([place]) 04/23/2016  . DIABETES MELLITUS, TYPE II 02/12/2008  . Facial cellulitis 01/01/2014  . FATIGUE 02/12/2008  . HYPERLIPIDEMIA 02/12/2008  . HYPERTENSION 11/05/2006  . KNEE PAIN, LEFT, ACUTE 05/11/2010  . LOW BACK PAIN 02/12/2008  . OSTEOPENIA 02/12/2008  . PEPTIC ULCER DISEASE 02/12/2008  . Shingles outbreak 01/02/2014  . SINUSITIS- ACUTE-NOS 10/08/2008     Patient Active Problem List   Diagnosis Date Noted  . History of colon polyps 05/04/2017  . Varicose veins of bilateral lower extremities with other complications 17/51/0258  . Cancer of lower-inner quadrant of left female breast ([place]) 04/23/2016  . Pain due to varicose veins of lower extremity 03/09/2016  . Constipation 09/12/2015  . Diarrhea 01/04/2014  . Shingles outbreak 01/02/2014  . Impaired fasting glucose 01/02/2014  . Hypokalemia 01/02/2014  . Acute kidney injury ([place]) 01/02/2014  . Facial cellulitis 01/01/2014  . Whiplash injury to neck 12/24/2012  . Trapezoid ligament sprain 12/24/2012  . Neck pain on left side 12/24/2012  . Left lumbar radiculopathy 12/22/2012  . Arthralgia 11/13/2012  . Preventative health care 11/07/2010  . KNEE PAIN, LEFT, ACUTE 05/11/2010  . Diabetes ([place]) 02/12/2008  . Hyperlipidemia 02/12/2008  . ANEMIA-IRON DEFICIENCY 02/12/2008  . ALLERGIC RHINITIS 02/12/2008  . PEPTIC ULCER DISEASE 02/12/2008  . LOW BACK PAIN 02/12/2008  . OSTEOPENIA 02/12/2008  . FATIGUE 02/12/2008  . Essential hypertension 11/05/2006    Past Surgical History:  Procedure Laterality Date  . ABDOMINAL HYSTERECTOMY    . APPENDECTOMY    . ESOPHAGOGASTRODUODENOSCOPY  1970     OB History   No obstetric history on file.      Home Medications    Prior to Admission medications   Medication Sig Start Date End Date Taking? Authorizing Provider  amLODipine (NORVASC) 5 MG tablet TAKE 1 TABLET BY MOUTH EVERY DAY Patient taking differently: Take 5 mg by mouth daily.  06/02/18  Yes John,  Hunt Oris, MD  oxyCODONE (OXY IR/ROXICODONE) 5 MG immediate release tablet Take 5 mg by mouth every 4 (four) hours as needed (pain).  10/15/18  Yes [provider]  docusate sodium (COLACE) 100 MG capsule Take 1 capsule (100 mg total) by mouth every 12 (twelve) hours. Patient not taking: Reported on 11/06/2018 09/21/18   Virgel Manifold, MD  ezetimibe (ZETIA) 10 MG tablet TAKE 1  TABLET (10 MG TOTAL) BY MOUTH DAILY. MUST KEEP SCHEDULED APPT FOR FUTURE REFILLS Patient not taking: Reported on 11/06/2018 01/24/18   Biagio Borg, MD  letrozole [facility]) 2.5 MG tablet Take 1 tablet (2.5 mg total) by mouth daily. Patient not taking: Reported on 09/20/2018 04/28/16   Nicholas Lose, MD  oxyCODONE-acetaminophen (PERCOCET/ROXICET) 5-325 MG tablet Take 1-2 tablets by mouth every 6 (six) hours as needed for severe pain. Patient not taking: Reported on 11/06/2018 09/21/18   Virgel Manifold, MD  valsartan-hydrochlorothiazide (DIOVAN-HCT) 320-25 MG tablet Take 1 tablet by mouth daily. Patient not taking: Reported on 09/20/2018 04/28/17   Biagio Borg, MD    Family History Family History  Problem Relation Age of Onset  . Hypertension Brother   . Colon polyps Brother 54  . Coronary artery disease Brother   . Thyroid cancer Brother   . Mental illness Other   . Depression Child        manic depression  . Throat cancer Other     Social History Social History   Tobacco Use  . Smoking status: Former Research scientist (life sciences)  . Smokeless tobacco: Never Used  Substance Use Topics  . Alcohol use: Yes    Comment: rare  . Drug use: No     Allergies   Lipitor [atorvastatin calcium], Lovastatin, Simvastatin, and Tramadol   Review of Systems Review of Systems  Unable to perform ROS: Dementia  Gastrointestinal: Positive for abdominal pain. Negative for diarrhea, nausea and vomiting.  Musculoskeletal: Positive for neck pain.  Neurological: Positive for headaches.     Physical Exam Updated Vital Signs BP 107/72   Pulse 95   Temp 98.2 F (36.8 C) (Oral)   Resp 16   Ht 5\' 3"  (1.6 m)   Wt 50.8 kg   SpO2 99%   BMI 19.84 kg/m   Physical Exam Vitals signs and nursing note reviewed.  Constitutional:      General: She is not in acute distress.    Appearance: She is not ill-appearing, toxic-appearing or diaphoretic.  HENT:     Head: Normocephalic and atraumatic.     Nose: Nose normal. No  congestion or rhinorrhea.     Mouth/Throat:     Mouth: Mucous membranes are moist.     Pharynx: Oropharynx is clear. No oropharyngeal exudate or posterior oropharyngeal erythema.  Eyes:     Extraocular Movements: Extraocular movements intact.     Pupils: Pupils are equal, round, and reactive to light.  Neck:     Musculoskeletal: Normal range of motion and neck supple. No neck rigidity or muscular tenderness.  Cardiovascular:     Rate and Rhythm: Normal rate and regular rhythm.     Pulses: Normal pulses.     Heart sounds: Normal heart sounds. No murmur. No friction rub. No gallop.   Pulmonary:     Effort: Pulmonary effort is normal. No respiratory distress.     Breath sounds: Normal breath sounds. No stridor. No wheezing, rhonchi or rales.  Chest:     Chest wall: No tenderness.  Abdominal:     General:  Abdomen is flat. There is no distension.     Palpations: Abdomen is soft.     Tenderness: There is no abdominal tenderness. There is no guarding or rebound.  Musculoskeletal: Normal range of motion.        General: No swelling, tenderness, deformity or signs of injury.  Skin:    General: Skin is warm and dry.  Neurological:     General: No focal deficit present.     Mental Status: She is alert. Mental status is at baseline. She is disoriented.     Cranial Nerves: No cranial nerve deficit.     Sensory: No sensory deficit.     Motor: No weakness.  Psychiatric:        Mood and Affect: Mood normal.        Behavior: Behavior normal.      ED Treatments / Results  Labs (all labs ordered are listed, but only abnormal results are displayed) Labs Reviewed - No data to display  EKG None  Radiology No results found.  Procedures Procedures (including critical care time)  Medications Ordered in ED Medications - No data to display   Initial Impression / Assessment and Plan / ED Course  I have reviewed the triage vital signs and the nursing notes.  Pertinent labs & imaging  results that were available during my care of the patient were reviewed by me and considered in my medical decision making (see chart for details).        Misty [name] is a 78 y.o. female with history of Alzheimer dementia and metastatic breast cancer with metastases to the bone and brain for which she is on home hospice presents to the ED via EMS complaining of abdominal pain this afternoon.  EMS reports this episode resolved during transit after patient belched.  On arrival to the ED, patient has no complaints and is not in any pain.  She appears well on physical exam.  Her abdomen is soft, nontender, and benign appearing.  No further work-up is indicated at this time.  Patient was discharged home in stable condition.  Final Clinical Impressions(s) / ED Diagnoses   Final diagnoses:  Epigastric pain    ED Discharge Orders    None       Candie Chroman, MD 11/07/18 0105    Lacretia Leigh, MD 11/07/18 1438

## 2018-11-06 NOTE — ED Provider Notes (Signed)
I saw and evaluated the patient, reviewed the resident's note and I agree with the findings and plan.  EKG:   Patient complaining of epigastric pain that occurred after she ate cereal.  Got better after she burped.  Denies any abdominal discomfort this time.  Abdominal exam is benign.  Stable for discharge   Lacretia Leigh, MD 11/06/18 1708

## 2018-11-06 NOTE — Progress Notes (Signed)
[employer]uring [facility]) Hospice  Misty [name] is an active hospice with [facility] since 8/4.  Pt called our on call services with complaints of sudden and severe abdominal pain.  Misty [name] said she could not wait for a RN to come to her home and activated EMS.  She has an out of facility DNR.  [facility] will continue to follow for d/c or admission determination.  If she d/c's home and needs ambulance transport, please use [facility] non emergency transport, they contract this service for our active hospice pts.  [name] RN, BSN, [facility] Liaison (in [place]) [phone]

## 2018-11-06 NOTE — ED Triage Notes (Signed)
Pt here from home, had sudden onset epigastric pain after eating cereal. Pt burped while EMS was on scene, with resolution of pain. Pt endorses diffuse back pain. Increase frequency in urination, denies pain with such. 500 NS bolus given PTA. Pt is DNR, hospice patient, stage four cancer.

## 2018-11-06 NOTE — ED Notes (Signed)
Patient verbalizes understanding of discharge instructions. Opportunity for questioning and answers were provided. Armband removed by staff, pt discharged from ED.  

## 2018-11-20 ENCOUNTER — Telehealth: Payer: Self-pay

## 2018-11-20 NOTE — Unmapped (Deleted)
No answer. Left VM 

## 2018-11-20 NOTE — Unmapped (Signed)
Hi Dr Archie Balboa,    Patient Katherine Sherman contacted the Communication Center to cancel their appointment for 11/20/18.  The appointment has been cancelled.    Cancellation Reason: Patient is receiving Hospice care now.    Thank you,  Christell Faith  [facility]   [phone]

## 2018-11-20 NOTE — Telephone Encounter (Signed)
POC pulled from media and has been faxed again.   Copied from [place] [phone]. Topic: General - Inquiry >> Nov 16, 2018 12:20 PM Scherrie Gerlach wrote: Reason for [place] with Hospice states she has faxed 3 times for CTI and Plan Of Care orders to be signed and she needs this back.  Service dates 10/418-01/28/19  Requesting call back, will not resend again without speaking with you.  [phone]

## 2018-12-12 ENCOUNTER — Telehealth: Payer: Self-pay

## 2018-12-12 NOTE — Telephone Encounter (Signed)
Received dc from [facility].  DC is for burial and a patient of Doctor Jenny Reichmann.. Dc will be taken to [facility] [place] for signature.  On 12/13/2018 Received signed dc back from Doctor Jenny Reichmann.  I called the funeral home to let them know the dc is ready for pickup.

## 2018-12-28 DEATH — deceased

## 2019-02-05 DIAGNOSIS — C50919 Malignant neoplasm of unspecified site of unspecified female breast: Principal | ICD-10-CM

## 2019-06-05 ENCOUNTER — Ambulatory Visit: Payer: Medicare Other | Admitting: Internal Medicine
